# Patient Record
Sex: Female | Born: 1960
Health system: Southern US, Community
[De-identification: ages and names within clinical notes are randomized; demographics above are authoritative.]

## PROBLEM LIST (undated history)

## (undated) DIAGNOSIS — M1990 Unspecified osteoarthritis, unspecified site: Secondary | ICD-10-CM

## (undated) DIAGNOSIS — I10 Essential (primary) hypertension: Secondary | ICD-10-CM

## (undated) DIAGNOSIS — F419 Anxiety disorder, unspecified: Secondary | ICD-10-CM

## (undated) DIAGNOSIS — F319 Bipolar disorder, unspecified: Secondary | ICD-10-CM

## (undated) DIAGNOSIS — E079 Disorder of thyroid, unspecified: Secondary | ICD-10-CM

## (undated) DIAGNOSIS — J45909 Unspecified asthma, uncomplicated: Secondary | ICD-10-CM

## (undated) DIAGNOSIS — B182 Chronic viral hepatitis C: Secondary | ICD-10-CM

## (undated) DIAGNOSIS — E039 Hypothyroidism, unspecified: Secondary | ICD-10-CM

## (undated) DIAGNOSIS — K7460 Unspecified cirrhosis of liver: Secondary | ICD-10-CM

## (undated) DIAGNOSIS — K759 Inflammatory liver disease, unspecified: Secondary | ICD-10-CM

## (undated) HISTORY — PX: ABDOMINAL HYSTERECTOMY: SHX81

## (undated) HISTORY — PX: ABDOMINAL SURGERY: SHX537

## (undated) HISTORY — PX: APPENDECTOMY: SHX54

## (undated) HISTORY — PX: CHOLECYSTECTOMY: SHX55

## (undated) MED FILL — Cisplatin Inj 100 MG/100ML (1 MG/ML): INTRAVENOUS | Qty: 140 | Status: AC

## (undated) NOTE — *Deleted (*Deleted)
[facility]  [address] [place],  [place]  [address] [phone]  Clinic Day:  03/30/2020  Referring physician: Marianne Sofia, PA-C   HISTORY OF PRESENT ILLNESS:  The patient is a 70 y.o. 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  Labs dating back to 2011 have shown her platelet count consistently around 100K.  As she has not been severely thrombocytopenic, her platelets have been followed conservatively.  She comes in today for routine follow-up.  Since her last visit, the patient has been doing fairly well.  She denies having any subcutaneous bruising/bleeding issues which concern her for a declining platelet count.     PHYSICAL EXAM:  There were no vitals taken for this visit. Wt Readings from Last 3 Encounters:  01/16/20 208 lb (94.3 kg)  10/12/19 206 lb (93.4 kg)  07/02/19 218 lb (98.9 kg)   There is no height or weight on file to calculate BMI. Performance status (ECOG): {CHL ONC [id number] Physical Exam Constitutional:      Appearance: Normal appearance. She is not ill-appearing.  HENT:     Mouth/Throat:     Mouth: Mucous membranes are moist.     Pharynx: Oropharynx is clear. No oropharyngeal exudate or posterior oropharyngeal erythema.  Cardiovascular:     Rate and Rhythm: Normal rate and regular rhythm.     Heart sounds: No murmur heard.  No friction rub. No gallop.   Pulmonary:     Effort: Pulmonary effort is normal. No respiratory distress.     Breath sounds: Normal breath sounds. No wheezing, rhonchi or rales.  Abdominal:     General: Bowel sounds are normal. There is no distension.     Palpations: Abdomen is soft. There is no mass.     Tenderness: There is no abdominal tenderness.  Musculoskeletal:        General: No swelling.     Right lower leg: No edema.     Left lower leg: No edema.  Lymphadenopathy:     Cervical: No cervical adenopathy.     Upper Body:      Right upper body: No supraclavicular or axillary adenopathy.     Left upper body: No supraclavicular or axillary adenopathy.     Lower Body: No right inguinal adenopathy. No left inguinal adenopathy.  Skin:    General: Skin is warm.     Coloration: Skin is not jaundiced.     Findings: No lesion or rash.  Neurological:     General: No focal deficit present.     Mental Status: She is alert and oriented to person, place, and time. Mental status is at baseline.     Cranial Nerves: Cranial nerves are intact.  Psychiatric:        Mood and Affect: Mood normal.        Behavior: Behavior normal.        Thought Content: Thought content normal.     LABS:      CBC Latest Ref Rng & Units 01/16/2020 10/12/2019 07/02/2019  WBC 3.4 - 10.8 x10E3/uL 6.9 5.3 6.4  Hemoglobin 11.1 - 15.9 g/dL 56.2 13.0 86.5  Hematocrit 34.0 - 46.6 % 37.5 35.1 37.6  Platelets 150 - 450 x10E3/uL 78(LL) 109(L) 87(LL)   CMP Latest Ref Rng & Units 01/16/2020 10/12/2019 07/02/2019  Glucose 65 - 99 mg/dL 82 784(O) 87  BUN 6 - 24 mg/dL 17 8 13   Creatinine 0.57 - 1.00 mg/dL 9.62 9.52 8.41  Sodium 134 - 144 mmol/L 142 139 144  Potassium 3.5 - 5.2 mmol/L 3.6 3.9 4.0  Chloride 96 - 106 mmol/L 105 104 107(H)  CO2 20 - 29 mmol/L 24 22 23   Calcium 8.7 - 10.2 mg/dL 0.2(V) 8.7 9.4  Total Protein 6.0 - 8.5 g/dL 6.4 6.6 6.7  Total Bilirubin 0.0 - 1.2 mg/dL 1.1 0.8 1.2  Alkaline Phos 48 - 121 IU/L 69 67 74  AST 0 - 40 IU/L 35 27 37  ALT 0 - 32 IU/L 24 19 26      STUDIES:  No results found.    ASSESSMENT & PLAN:   Assessment/Plan:  A 46 y.o. female with *** .The patient understands all the plans discussed today and is in agreement with them.      Myrl Lazarus Kirby Funk, MD

---

## 1999-06-12 ENCOUNTER — Emergency Department (HOSPITAL_COMMUNITY): Admission: EM | Admit: 1999-06-12 | Discharge: 1999-06-12 | Payer: Self-pay | Admitting: Emergency Medicine

## 2006-04-19 ENCOUNTER — Ambulatory Visit: Payer: Self-pay | Admitting: Gastroenterology

## 2006-06-03 ENCOUNTER — Ambulatory Visit (HOSPITAL_COMMUNITY): Admission: RE | Admit: 2006-06-03 | Discharge: 2006-06-03 | Payer: Self-pay | Admitting: Gastroenterology

## 2006-06-03 ENCOUNTER — Encounter (INDEPENDENT_AMBULATORY_CARE_PROVIDER_SITE_OTHER): Payer: Self-pay | Admitting: *Deleted

## 2006-07-06 ENCOUNTER — Emergency Department (HOSPITAL_COMMUNITY): Admission: EM | Admit: 2006-07-06 | Discharge: 2006-07-07 | Payer: Self-pay | Admitting: Emergency Medicine

## 2006-08-04 ENCOUNTER — Ambulatory Visit: Payer: Self-pay | Admitting: Gastroenterology

## 2006-11-03 ENCOUNTER — Ambulatory Visit: Payer: Self-pay | Admitting: Gastroenterology

## 2006-11-16 ENCOUNTER — Emergency Department (HOSPITAL_COMMUNITY): Admission: EM | Admit: 2006-11-16 | Discharge: 2006-11-17 | Payer: Self-pay | Admitting: Emergency Medicine

## 2006-11-22 ENCOUNTER — Ambulatory Visit: Payer: Self-pay | Admitting: Gastroenterology

## 2007-02-02 ENCOUNTER — Ambulatory Visit: Payer: Self-pay | Admitting: Gastroenterology

## 2007-02-02 ENCOUNTER — Emergency Department (HOSPITAL_COMMUNITY): Admission: EM | Admit: 2007-02-02 | Discharge: 2007-02-02 | Payer: Self-pay | Admitting: Emergency Medicine

## 2007-02-16 ENCOUNTER — Ambulatory Visit: Payer: Self-pay | Admitting: Gastroenterology

## 2007-02-28 ENCOUNTER — Ambulatory Visit: Payer: Self-pay | Admitting: Gastroenterology

## 2007-03-15 ENCOUNTER — Ambulatory Visit: Payer: Self-pay | Admitting: Gastroenterology

## 2007-03-31 ENCOUNTER — Encounter: Payer: Self-pay | Admitting: Pulmonary Disease

## 2007-04-11 ENCOUNTER — Emergency Department (HOSPITAL_COMMUNITY): Admission: EM | Admit: 2007-04-11 | Discharge: 2007-04-11 | Payer: Self-pay | Admitting: Emergency Medicine

## 2007-04-12 ENCOUNTER — Ambulatory Visit: Payer: Self-pay | Admitting: Pulmonary Disease

## 2007-04-13 ENCOUNTER — Ambulatory Visit: Payer: Self-pay | Admitting: Gastroenterology

## 2007-04-20 ENCOUNTER — Ambulatory Visit: Payer: Self-pay | Admitting: Gastroenterology

## 2007-05-16 ENCOUNTER — Ambulatory Visit (HOSPITAL_COMMUNITY): Admission: RE | Admit: 2007-05-16 | Discharge: 2007-05-16 | Payer: Self-pay | Admitting: Emergency Medicine

## 2007-05-16 ENCOUNTER — Ambulatory Visit: Payer: Self-pay | Admitting: Gastroenterology

## 2007-05-25 DIAGNOSIS — J45909 Unspecified asthma, uncomplicated: Secondary | ICD-10-CM | POA: Insufficient documentation

## 2007-05-25 DIAGNOSIS — I10 Essential (primary) hypertension: Secondary | ICD-10-CM | POA: Insufficient documentation

## 2007-06-01 ENCOUNTER — Ambulatory Visit: Payer: Self-pay | Admitting: Gastroenterology

## 2007-06-06 ENCOUNTER — Ambulatory Visit: Payer: Self-pay | Admitting: Pulmonary Disease

## 2007-06-06 DIAGNOSIS — R05 Cough: Secondary | ICD-10-CM

## 2007-06-06 DIAGNOSIS — R0602 Shortness of breath: Secondary | ICD-10-CM

## 2007-06-06 DIAGNOSIS — J383 Other diseases of vocal cords: Secondary | ICD-10-CM

## 2007-06-07 ENCOUNTER — Ambulatory Visit: Payer: Self-pay | Admitting: Pulmonary Disease

## 2007-06-08 ENCOUNTER — Telehealth (INDEPENDENT_AMBULATORY_CARE_PROVIDER_SITE_OTHER): Payer: Self-pay | Admitting: *Deleted

## 2007-06-09 ENCOUNTER — Telehealth: Payer: Self-pay | Admitting: Pulmonary Disease

## 2007-06-19 ENCOUNTER — Ambulatory Visit: Payer: Self-pay | Admitting: Pulmonary Disease

## 2007-06-20 ENCOUNTER — Ambulatory Visit: Payer: Self-pay | Admitting: Gastroenterology

## 2007-06-21 DIAGNOSIS — J984 Other disorders of lung: Secondary | ICD-10-CM

## 2007-06-29 ENCOUNTER — Ambulatory Visit: Payer: Self-pay | Admitting: Gastroenterology

## 2007-07-06 ENCOUNTER — Ambulatory Visit: Payer: Self-pay | Admitting: Gastroenterology

## 2007-07-19 ENCOUNTER — Ambulatory Visit: Payer: Self-pay | Admitting: Pulmonary Disease

## 2007-07-24 ENCOUNTER — Encounter: Payer: Self-pay | Admitting: Pulmonary Disease

## 2007-07-24 ENCOUNTER — Telehealth (INDEPENDENT_AMBULATORY_CARE_PROVIDER_SITE_OTHER): Payer: Self-pay | Admitting: *Deleted

## 2007-08-01 ENCOUNTER — Encounter: Payer: Self-pay | Admitting: Pulmonary Disease

## 2007-08-10 ENCOUNTER — Ambulatory Visit: Payer: Self-pay | Admitting: Gastroenterology

## 2007-08-24 ENCOUNTER — Ambulatory Visit: Payer: Self-pay | Admitting: Gastroenterology

## 2007-09-04 ENCOUNTER — Ambulatory Visit (HOSPITAL_COMMUNITY): Admission: RE | Admit: 2007-09-04 | Discharge: 2007-09-04 | Payer: Self-pay | Admitting: Gastroenterology

## 2007-09-07 ENCOUNTER — Ambulatory Visit: Payer: Self-pay | Admitting: Gastroenterology

## 2007-09-07 ENCOUNTER — Encounter: Admission: RE | Admit: 2007-09-07 | Discharge: 2007-09-07 | Payer: Self-pay | Admitting: Obstetrics and Gynecology

## 2007-10-04 ENCOUNTER — Ambulatory Visit: Payer: Self-pay | Admitting: Pulmonary Disease

## 2007-10-05 ENCOUNTER — Ambulatory Visit: Payer: Self-pay | Admitting: Gastroenterology

## 2007-10-19 ENCOUNTER — Ambulatory Visit: Payer: Self-pay | Admitting: Gastroenterology

## 2008-02-02 ENCOUNTER — Ambulatory Visit: Payer: Self-pay | Admitting: Pulmonary Disease

## 2008-04-21 ENCOUNTER — Emergency Department (HOSPITAL_COMMUNITY): Admission: EM | Admit: 2008-04-21 | Discharge: 2008-04-21 | Payer: Self-pay | Admitting: Emergency Medicine

## 2008-07-12 ENCOUNTER — Encounter: Payer: Self-pay | Admitting: Pulmonary Disease

## 2008-07-22 ENCOUNTER — Ambulatory Visit (HOSPITAL_COMMUNITY): Admission: RE | Admit: 2008-07-22 | Discharge: 2008-07-22 | Payer: Self-pay | Admitting: Internal Medicine

## 2008-07-31 ENCOUNTER — Ambulatory Visit: Payer: Self-pay | Admitting: Pulmonary Disease

## 2008-07-31 DIAGNOSIS — J309 Allergic rhinitis, unspecified: Secondary | ICD-10-CM | POA: Insufficient documentation

## 2009-01-11 IMAGING — CR DG ABDOMEN 2V
2 series · 2 of 2 positions shown · non-contrast
Comparison: None
Correlation:  CT abdomen and pelvis 11/17/2006

CLINICAL DATA: Lower abdominal pain, redness, swelling, shortness
of breath

ABDOMEN - 2 VIEW

[w abdomen upright]
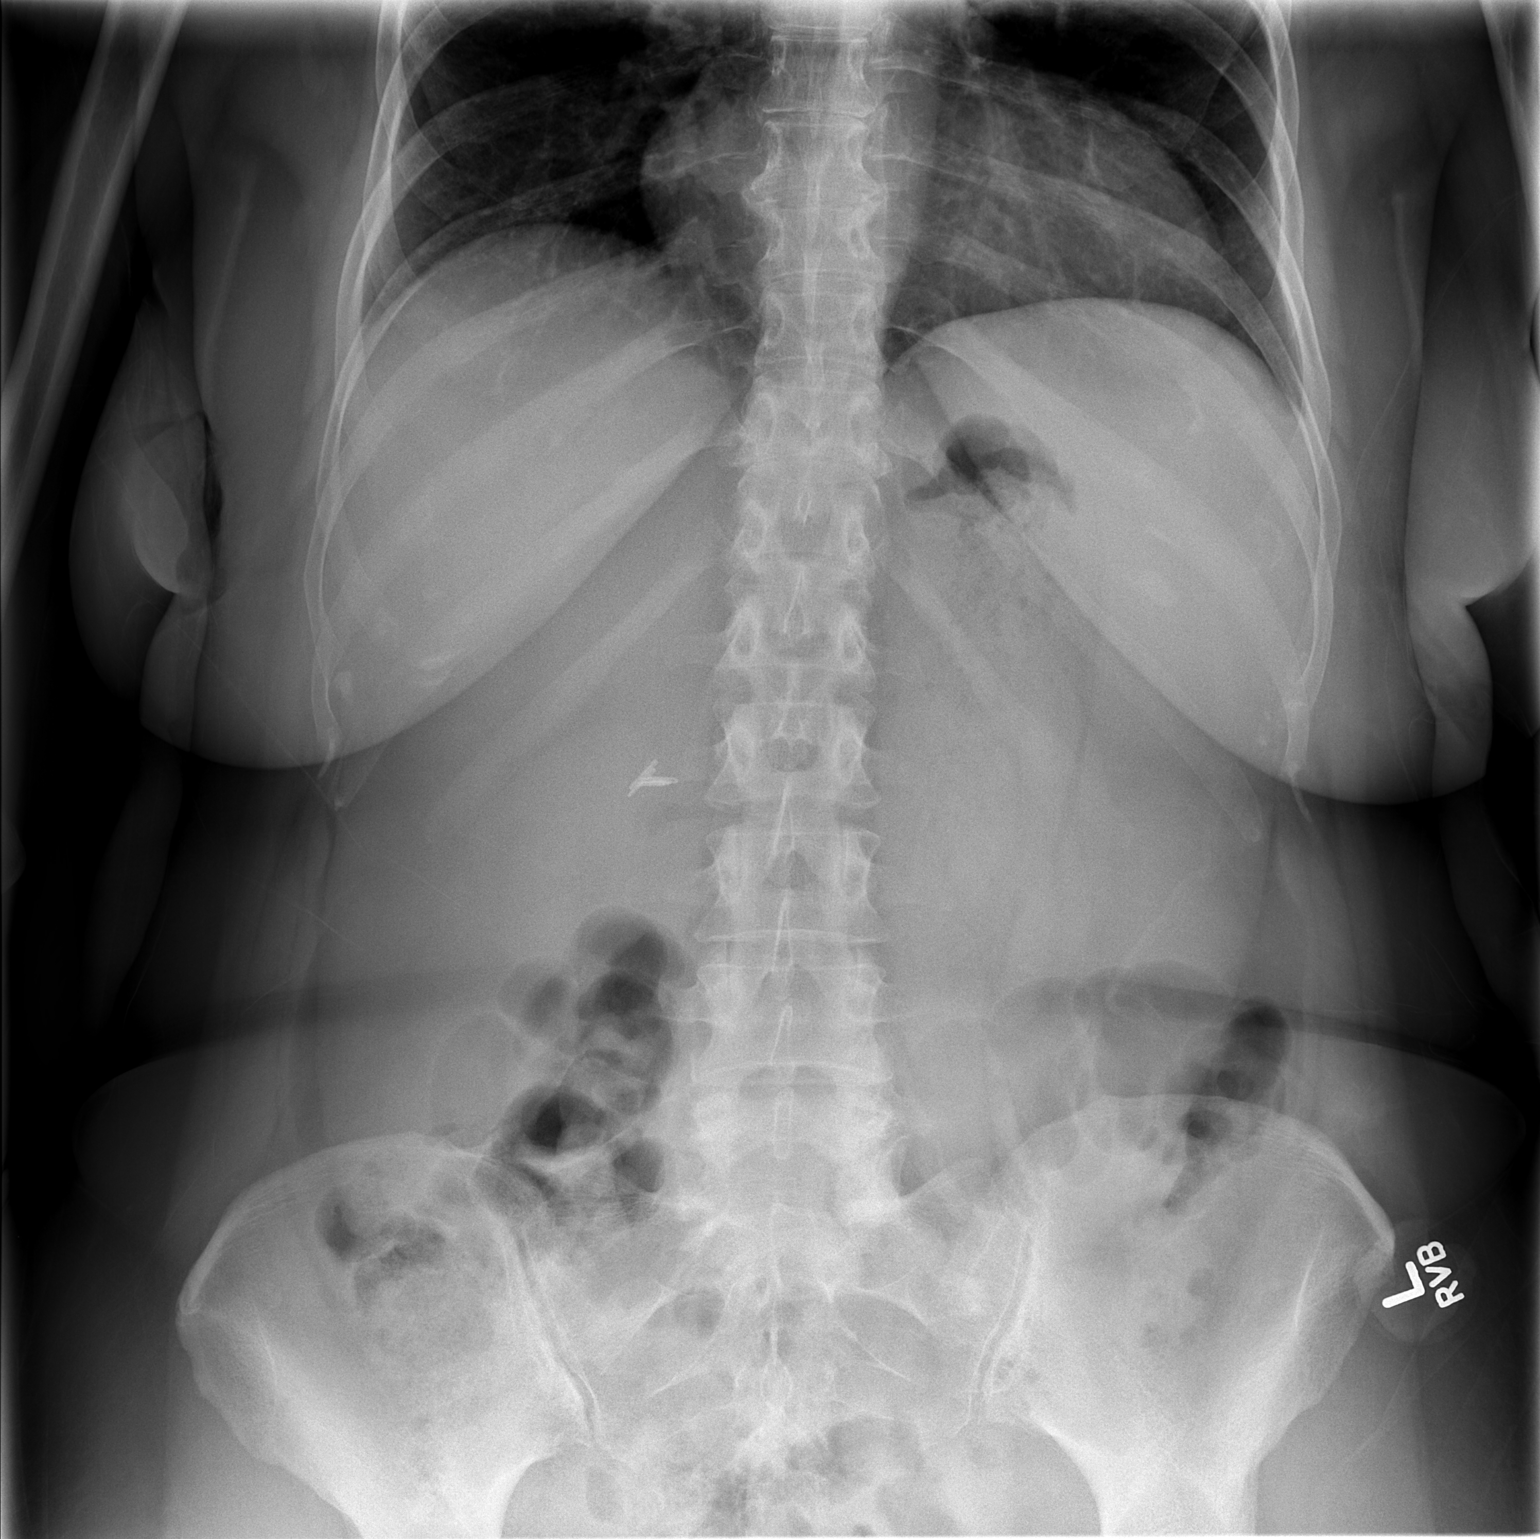

[t abdomen supine]
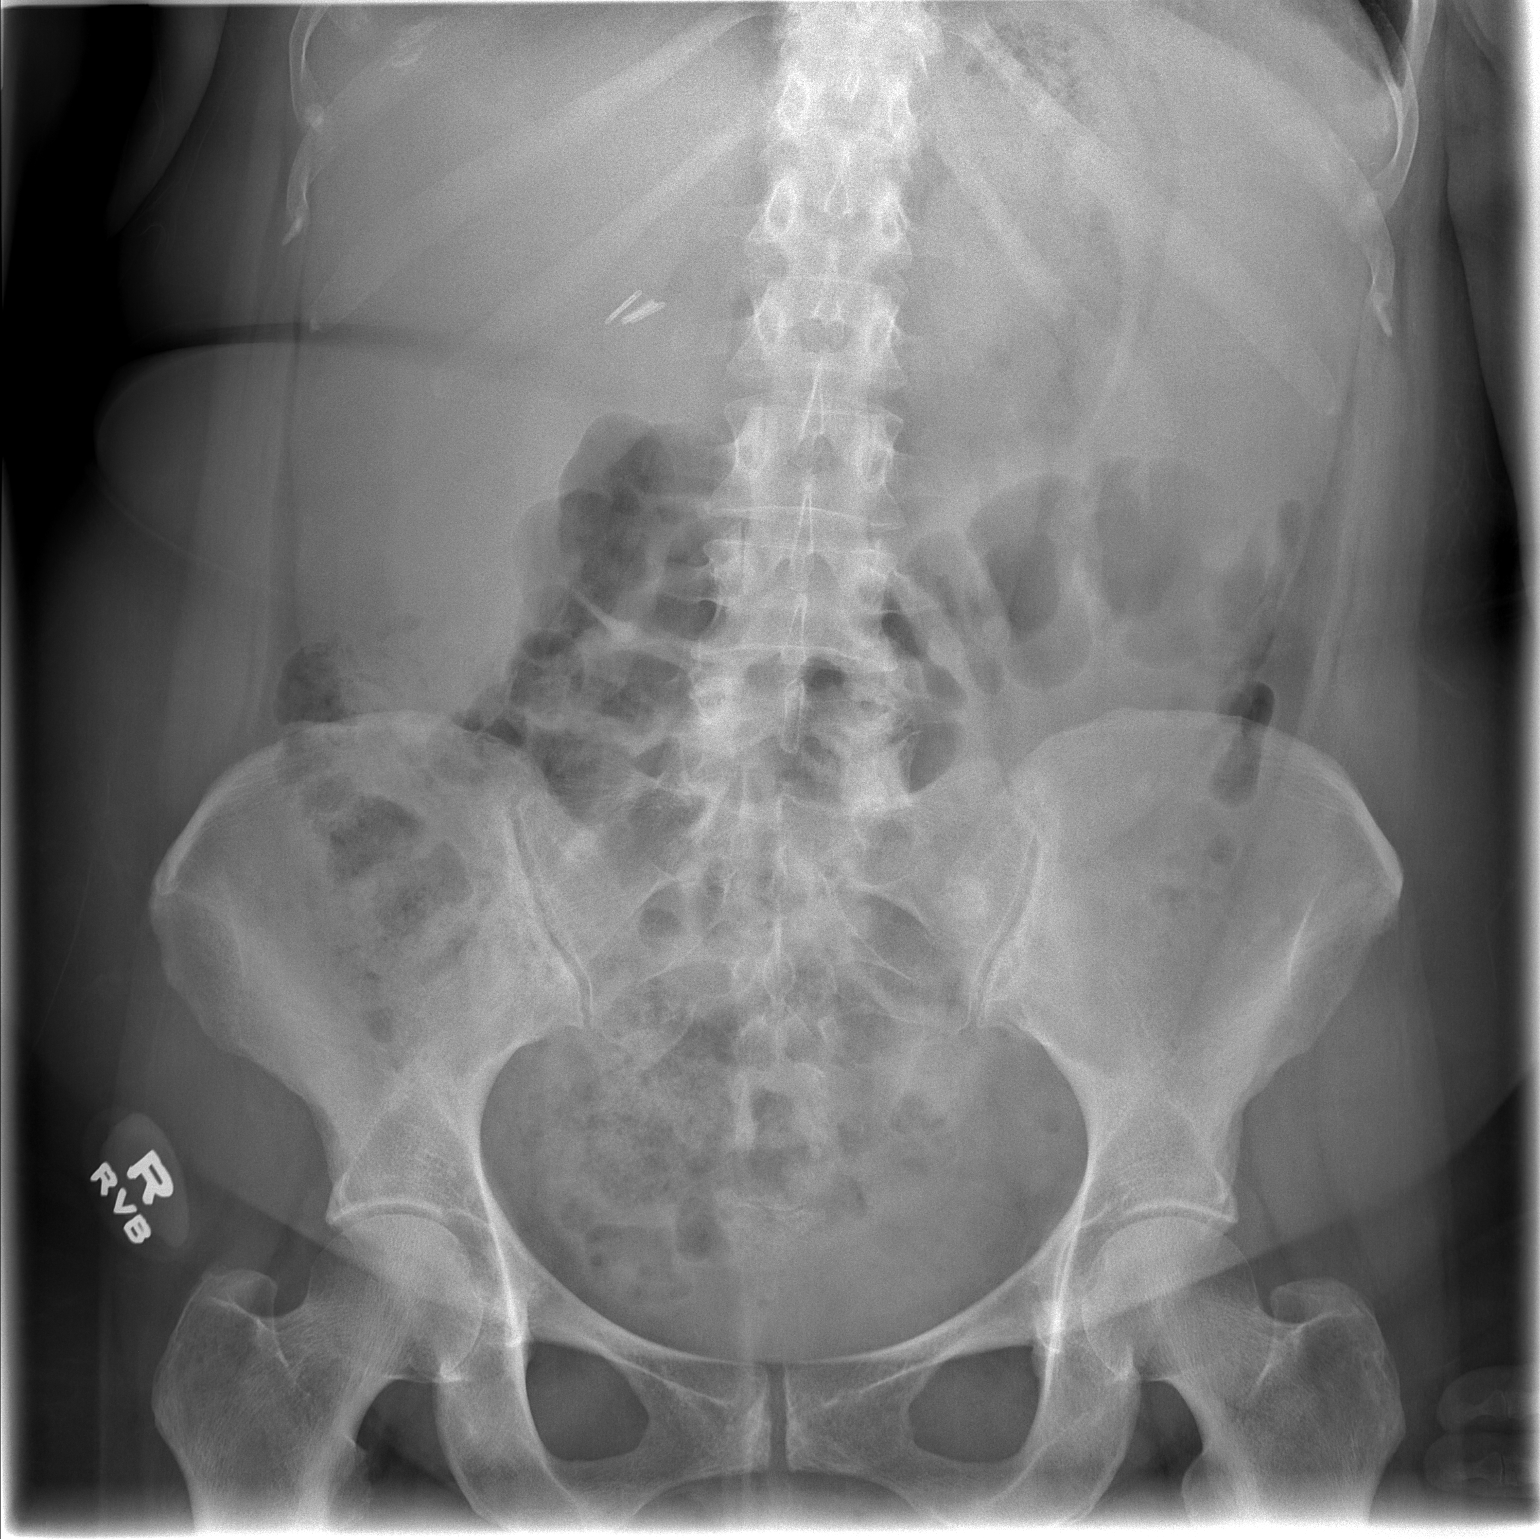

[2 of 2 positions shown; findings below may reference images not displayed]

FINDINGS: Surgical clips right upper quadrant question cholecystectomy.
Normal bowel gas pattern.
No bowel dilatation, bowel wall thickening, or free air.
Density at left pelvis corresponds to bone island in left sacrum on
prior CT.
No definite urinary tract calcification or acute bony abnormality.
IMPRESSION: No acute abnormalities.

## 2011-02-15 LAB — CBC
HCT: 25 — ABNORMAL LOW
Hemoglobin: 8.6 — ABNORMAL LOW
RDW: 17.6 — ABNORMAL HIGH
WBC: 3.6 — ABNORMAL LOW

## 2011-02-15 LAB — COMPREHENSIVE METABOLIC PANEL
AST: 74 — ABNORMAL HIGH
BUN: 5 — ABNORMAL LOW
CO2: 25
Calcium: 8.6
Chloride: 99
GFR calc non Af Amer: >60
Glucose, Bld: 125 — ABNORMAL HIGH
Potassium: 3.6
Total Bilirubin: 1.1
Total Protein: 6.9

## 2011-02-15 LAB — DIFFERENTIAL
Eosinophils Absolute: 0.1 — ABNORMAL LOW
Lymphs Abs: 0.8
Monocytes Absolute: 0.3
Monocytes Relative: 8
Neutro Abs: 2.4

## 2011-02-15 LAB — POCT CARDIAC MARKERS
Operator id: 4533
Troponin i, poc: <0.05

## 2011-02-23 LAB — CBC
HCT: 38.9
Hemoglobin: 13.2
RBC: 4.09
RDW: 13

## 2011-02-23 LAB — COMPREHENSIVE METABOLIC PANEL
Alkaline Phosphatase: 73
BUN: 15
CO2: 29
Chloride: 103
GFR calc non Af Amer: >60
Glucose, Bld: 103 — ABNORMAL HIGH
Potassium: 3.7
Total Bilirubin: 0.7
Total Protein: 6.9

## 2011-02-23 LAB — PROTIME-INR
INR: 1.1
Prothrombin Time: 14.2

## 2011-02-23 LAB — URINALYSIS, ROUTINE W REFLEX MICROSCOPIC
Hgb urine dipstick: NEGATIVE
Ketones, ur: NEGATIVE
Nitrite: NEGATIVE
Specific Gravity, Urine: 1.02
Urobilinogen, UA: 1

## 2011-02-23 LAB — LIPASE, BLOOD: Lipase: 43

## 2011-02-23 LAB — DIFFERENTIAL
Basophils Absolute: 0
Basophils Relative: 1
Eosinophils Absolute: 0.2
Neutro Abs: 3.7
Neutrophils Relative %: 54

## 2012-05-23 ENCOUNTER — Emergency Department (HOSPITAL_COMMUNITY): Payer: Medicare Other

## 2012-05-23 ENCOUNTER — Encounter (HOSPITAL_COMMUNITY): Payer: Self-pay | Admitting: Emergency Medicine

## 2012-05-23 ENCOUNTER — Emergency Department (HOSPITAL_COMMUNITY)
Admission: EM | Admit: 2012-05-23 | Discharge: 2012-05-24 | Disposition: A | Discharge status: Home / Self Care | Payer: Medicare Other | Attending: Emergency Medicine | Admitting: Emergency Medicine

## 2012-05-23 DIAGNOSIS — R0602 Shortness of breath: Secondary | ICD-10-CM | POA: Insufficient documentation

## 2012-05-23 DIAGNOSIS — B9789 Other viral agents as the cause of diseases classified elsewhere: Secondary | ICD-10-CM | POA: Insufficient documentation

## 2012-05-23 DIAGNOSIS — Z79899 Other long term (current) drug therapy: Secondary | ICD-10-CM | POA: Insufficient documentation

## 2012-05-23 DIAGNOSIS — L408 Other psoriasis: Secondary | ICD-10-CM | POA: Insufficient documentation

## 2012-05-23 DIAGNOSIS — R509 Fever, unspecified: Secondary | ICD-10-CM | POA: Insufficient documentation

## 2012-05-23 DIAGNOSIS — J069 Acute upper respiratory infection, unspecified: Secondary | ICD-10-CM

## 2012-05-23 DIAGNOSIS — Z8719 Personal history of other diseases of the digestive system: Secondary | ICD-10-CM | POA: Insufficient documentation

## 2012-05-23 DIAGNOSIS — M1710 Unilateral primary osteoarthritis, unspecified knee: Secondary | ICD-10-CM | POA: Insufficient documentation

## 2012-05-23 DIAGNOSIS — J45909 Unspecified asthma, uncomplicated: Secondary | ICD-10-CM | POA: Insufficient documentation

## 2012-05-23 DIAGNOSIS — E079 Disorder of thyroid, unspecified: Secondary | ICD-10-CM | POA: Insufficient documentation

## 2012-05-23 DIAGNOSIS — L259 Unspecified contact dermatitis, unspecified cause: Secondary | ICD-10-CM | POA: Insufficient documentation

## 2012-05-23 DIAGNOSIS — J9801 Acute bronchospasm: Secondary | ICD-10-CM | POA: Insufficient documentation

## 2012-05-23 DIAGNOSIS — IMO0002 Reserved for concepts with insufficient information to code with codable children: Secondary | ICD-10-CM | POA: Insufficient documentation

## 2012-05-23 DIAGNOSIS — B349 Viral infection, unspecified: Secondary | ICD-10-CM

## 2012-05-23 DIAGNOSIS — F319 Bipolar disorder, unspecified: Secondary | ICD-10-CM | POA: Insufficient documentation

## 2012-05-23 HISTORY — DX: Disorder of thyroid, unspecified: E07.9

## 2012-05-23 HISTORY — DX: Bipolar disorder, unspecified: F31.9

## 2012-05-23 HISTORY — DX: Chronic viral hepatitis C: B18.2

## 2012-05-23 HISTORY — DX: Unspecified osteoarthritis, unspecified site: M19.90

## 2012-05-23 HISTORY — DX: Chronic viral hepatitis C: K74.60

## 2012-05-23 HISTORY — DX: Unspecified asthma, uncomplicated: J45.909

## 2012-05-23 LAB — COMPREHENSIVE METABOLIC PANEL
AST: 110 U/L — ABNORMAL HIGH (ref 0–37)
Albumin: 3.7 g/dL (ref 3.5–5.2)
Alkaline Phosphatase: 78 U/L (ref 39–117)
BUN: 11 mg/dL (ref 6–23)
Chloride: 101 mEq/L (ref 96–112)
Creatinine, Ser: 0.61 mg/dL (ref 0.50–1.10)
Potassium: 3.5 mEq/L (ref 3.5–5.1)
Total Bilirubin: 1.1 mg/dL (ref 0.3–1.2)
Total Protein: 8.1 g/dL (ref 6.0–8.3)

## 2012-05-23 LAB — CBC WITH DIFFERENTIAL/PLATELET
Basophils Absolute: 0 10*3/uL (ref 0.0–0.1)
Basophils Relative: 0 % (ref 0–1)
Eosinophils Absolute: 0.2 10*3/uL (ref 0.0–0.7)
MCH: 33.3 pg (ref 26.0–34.0)
MCHC: 36.2 g/dL — ABNORMAL HIGH (ref 30.0–36.0)
Neutro Abs: 4.8 10*3/uL (ref 1.7–7.7)
Neutrophils Relative %: 64 % (ref 43–77)
RDW: 12.8 % (ref 11.5–15.5)

## 2012-05-23 MED ORDER — BENZONATATE 100 MG PO CAPS
200.0000 mg | ORAL_CAPSULE | Freq: Three times a day (TID) | ORAL | Status: DC | PRN
  Administered 2012-05-24: 200 mg via ORAL
  Filled 2012-05-23: qty 2

## 2012-05-23 MED ORDER — PREDNISONE 20 MG PO TABS
60.0000 mg | ORAL_TABLET | Freq: Once | ORAL | Status: AC
  Administered 2012-05-24: 60 mg via ORAL
  Filled 2012-05-23: qty 3

## 2012-05-23 MED ORDER — ALBUTEROL SULFATE (5 MG/ML) 0.5% IN NEBU
2.5000 mg | INHALATION_SOLUTION | Freq: Once | RESPIRATORY_TRACT | Status: AC
  Administered 2012-05-24: 2.5 mg via RESPIRATORY_TRACT
  Filled 2012-05-23: qty 1

## 2012-05-23 MED ORDER — HYDROCOD POLST-CHLORPHEN POLST 10-8 MG/5ML PO LQCR
5.0000 mL | Freq: Once | ORAL | Status: AC
  Administered 2012-05-24: 5 mL via ORAL
  Filled 2012-05-23: qty 5

## 2012-05-23 MED ORDER — IPRATROPIUM BROMIDE 0.02 % IN SOLN
0.5000 mg | Freq: Once | RESPIRATORY_TRACT | Status: AC
  Administered 2012-05-24: 0.5 mg via RESPIRATORY_TRACT
  Filled 2012-05-23: qty 2.5

## 2012-05-23 NOTE — ED Notes (Signed)
Pt alert, arrives from home, c/o cough and sob, onset was last week, unable to see PCP, resp even unlabored, skin pwd

## 2012-05-24 MED ORDER — GUAIFENESIN ER 600 MG PO TB12: 600.0000 mg | ORAL_TABLET | Freq: Two times a day (BID) | ORAL | Status: DC | PRN

## 2012-05-24 MED ORDER — ALBUTEROL SULFATE (5 MG/ML) 0.5% IN NEBU
5.0000 mg | INHALATION_SOLUTION | Freq: Once | RESPIRATORY_TRACT | Status: AC
  Administered 2012-05-24: 5 mg via RESPIRATORY_TRACT
  Filled 2012-05-24: qty 1

## 2012-05-24 MED ORDER — IPRATROPIUM BROMIDE 0.02 % IN SOLN
0.5000 mg | Freq: Once | RESPIRATORY_TRACT | Status: AC
  Administered 2012-05-24: 0.5 mg via RESPIRATORY_TRACT
  Filled 2012-05-24: qty 2.5

## 2012-05-24 MED ORDER — OXYMETAZOLINE HCL 0.05 % NA SOLN
1.0000 | Freq: Once | NASAL | Status: AC
  Administered 2012-05-24: 1 via NASAL
  Filled 2012-05-24: qty 15

## 2012-05-24 MED ORDER — HYDROCOD POLST-CHLORPHEN POLST 10-8 MG/5ML PO LQCR: 5.0000 mL | Freq: Every evening | ORAL | Status: DC | PRN

## 2012-05-24 MED ORDER — BENZONATATE 200 MG PO CAPS: 200.0000 mg | ORAL_CAPSULE | Freq: Three times a day (TID) | ORAL | Status: DC | PRN

## 2012-05-24 MED ORDER — PREDNISONE 20 MG PO TABS: 60.0000 mg | ORAL_TABLET | Freq: Every day | ORAL | Status: DC

## 2012-05-24 NOTE — ED Provider Notes (Signed)
History     CSN: [id number]  Arrival date & time 05/23/12  2014   First MD Initiated Contact with Patient 05/23/12 2305      Chief Complaint  Patient presents with  . Cough  . Shortness of Breath    (Consider location/radiation/quality/duration/timing/severity/associated sxs/prior treatment) HPI 52 year old female presents emergency department with multiple complaints. She reports she has had cough and shortness of breath since Friday, 5 days ago. She has had subjective fevers. She's been using her albuterol inhaler and nebulizer without improvement in symptoms. She reports she was seen by her primary care Dr. 2 weeks ago, and they increased her Synthroid and told her her potassium was low. Patient has history of cirrhosis of the liver. She is complaining of right-sided abdominal pain do to increased coughing. She has had no nausea no vomiting no diarrhea. She received a flu shot and a Pneumovax this year. Patient has not been taking any over-the-counter cold medications. She reports she has the sensation of thick phlegm in the back of her throat which sometimes chokes her. She attempted to get in to see her doctor but they were too busy to see her today. She also is complaining of worsening of her ongoing eczema and psoriasis. She also is complaining of swelling in her knees where she has chronic arthritis and needs knee replacement.  Past Medical History  Diagnosis Date  . Cirrhosis of liver due to hepatitis C   . Asthma   . Arthritis   . Thyroid disease   . Bipolar affective disorder     Past Surgical History  Procedure Date  . Abdominal hysterectomy   . Cholecystectomy   . Appendectomy   . Abdominal surgery     No family history on file.  History  Substance Use Topics  . Smoking status: Never Smoker   . Smokeless tobacco: Not on file  . Alcohol Use: No    OB History    Grav Para Term Preterm Abortions TAB SAB Ect Mult Living                  Review of Systems  See History of Present Illness; otherwise all other systems are reviewed and negative  Allergies  Penicillins  Home Medications   Current Outpatient Rx  Name  Route  Sig  Dispense  Refill  . ALBUTEROL SULFATE HFA 108 (90 BASE) MCG/ACT IN AERS   Inhalation   Inhale 2 puffs into the lungs every 6 (six) hours as needed. ASTHMA         . ALPRAZOLAM 1 MG PO TABS   Oral   Take 1 mg by mouth 3 (three) times daily as needed. Anxiety         . BUDESONIDE-FORMOTEROL FUMARATE 160-4.5 MCG/ACT IN AERO   Inhalation   Inhale 2 puffs into the lungs 2 (two) times daily.         Marland Kitchen HYDROCODONE-ACETAMINOPHEN 5-500 MG PO TABS   Oral   Take 1 tablet by mouth every 6 (six) hours as needed. Pain         . LEVOTHYROXINE SODIUM 50 MCG PO TABS   Oral   Take 50 mcg by mouth daily.         Marland Kitchen LORATADINE 10 MG PO TABS   Oral   Take 10 mg by mouth daily.         Marland Kitchen LOSARTAN POTASSIUM-HCTZ 100-25 MG PO TABS   Oral   Take 1 tablet by mouth daily.         Marland Kitchen  POTASSIUM CHLORIDE ER 10 MEQ PO CPCR   Oral   Take 10 mEq by mouth daily.         Marland Kitchen ZOLPIDEM TARTRATE 5 MG PO TABS   Oral   Take 5 mg by mouth at bedtime.         Marland Kitchen LEVOTHYROXINE SODIUM 75 MCG PO TABS   Oral   Take 75 mcg by mouth daily.           BP 154/104  Pulse 91  Temp 98 F (36.7 C) (Oral)  Resp 16  SpO2 96%  Physical Exam  Nursing note and vitals reviewed. Constitutional:       Morbidly obese female, uncomfortable appearing but nontoxic  HENT:  Head: Normocephalic and atraumatic.  Nose: Nose normal.  Mouth/Throat: Oropharynx is clear and moist. No oropharyngeal exudate.  Eyes: Conjunctivae normal and EOM are normal. Pupils are equal, round, and reactive to light.  Neck: Normal range of motion. Neck supple.  Pulmonary/Chest: She has wheezes.  Abdominal: Soft. Bowel sounds are normal. She exhibits no distension and no mass. There is tenderness (mild tenderness to right side of abdomen). There is no rebound  and no guarding.  Musculoskeletal: Normal range of motion. She exhibits tenderness (tenderness to palpation to bilateralher knees). She exhibits no edema.  Neurological: She exhibits normal muscle tone. Coordination normal.  Skin: Skin is warm and dry. Rash (chronic rash to left lower leg, sacrum) noted. No erythema. No pallor.    ED Course  Procedures (including critical care time)  Labs Reviewed  CBC WITH DIFFERENTIAL - Abnormal; Notable for the following:    MCHC 36.2 (*)     Platelets 117 (*)     All other components within normal limits  COMPREHENSIVE METABOLIC PANEL - Abnormal; Notable for the following:    AST 110 (*)     ALT 103 (*)     All other components within normal limits   Dg Chest 2 View  05/23/2012  *RADIOLOGY REPORT*  Clinical Data: Shortness of breath and cough.  CHEST - 2 VIEW  Comparison: 08/04/2011  Findings: The heart size and pulmonary vascularity are normal. The lungs appear clear and expanded without focal air space disease or consolidation. No blunting of the costophrenic angles.  No pneumothorax.  Mediastinal contours appear intact.  Surgical clips in the right upper quadrant.  No significant changes since the previous study.  IMPRESSION: No evidence of active pulmonary disease.   Original Report Authenticated By: Burman Nieves, M.D.      1. Upper respiratory infection   2. Acute bronchospasm due to viral infection       MDM  52 year old female with history of asthma now with wheezing. Suspect bronchospasm secondary to viral infection. Chest x-ray unremarkable. She is noted to have elevation in her AST ALT and low platelets which go along with her cirrhosis of the liver. Will treat with steroids, albuterol/Atrovent neb treatment, and medications to help with cold symptoms   2:16 AM  Patient has received 2 nebulized treatments. There is some vocal cord dysfunction with some expiratory wheezing in the upper airway, she has faint expiratory wheezes in  her lung fields. She is satting normally, though she is safe to go home to followup with her primary care Dr.      Olivia Mackie, MD 05/24/12 [phone]

## 2012-06-24 ENCOUNTER — Other Ambulatory Visit: Payer: Self-pay

## 2012-10-16 DIAGNOSIS — K7460 Unspecified cirrhosis of liver: Secondary | ICD-10-CM | POA: Insufficient documentation

## 2013-03-15 ENCOUNTER — Other Ambulatory Visit: Payer: Self-pay

## 2014-02-22 ENCOUNTER — Other Ambulatory Visit: Payer: Self-pay

## 2015-06-27 DIAGNOSIS — Z1231 Encounter for screening mammogram for malignant neoplasm of breast: Secondary | ICD-10-CM | POA: Diagnosis not present

## 2015-07-24 DIAGNOSIS — I10 Essential (primary) hypertension: Secondary | ICD-10-CM | POA: Diagnosis not present

## 2015-07-24 DIAGNOSIS — F411 Generalized anxiety disorder: Secondary | ICD-10-CM | POA: Diagnosis not present

## 2015-07-24 DIAGNOSIS — M170 Bilateral primary osteoarthritis of knee: Secondary | ICD-10-CM | POA: Diagnosis not present

## 2015-07-24 DIAGNOSIS — B182 Chronic viral hepatitis C: Secondary | ICD-10-CM | POA: Diagnosis not present

## 2015-07-24 DIAGNOSIS — E038 Other specified hypothyroidism: Secondary | ICD-10-CM | POA: Diagnosis not present

## 2015-08-01 DIAGNOSIS — R161 Splenomegaly, not elsewhere classified: Secondary | ICD-10-CM | POA: Diagnosis not present

## 2015-08-01 DIAGNOSIS — R160 Hepatomegaly, not elsewhere classified: Secondary | ICD-10-CM | POA: Diagnosis not present

## 2015-08-01 DIAGNOSIS — C220 Liver cell carcinoma: Secondary | ICD-10-CM | POA: Diagnosis not present

## 2015-08-01 DIAGNOSIS — K769 Liver disease, unspecified: Secondary | ICD-10-CM | POA: Diagnosis not present

## 2015-10-29 DIAGNOSIS — Z1211 Encounter for screening for malignant neoplasm of colon: Secondary | ICD-10-CM | POA: Diagnosis not present

## 2015-10-29 DIAGNOSIS — Z0000 Encounter for general adult medical examination without abnormal findings: Secondary | ICD-10-CM | POA: Diagnosis not present

## 2015-10-29 DIAGNOSIS — R5383 Other fatigue: Secondary | ICD-10-CM | POA: Diagnosis not present

## 2015-10-29 DIAGNOSIS — E038 Other specified hypothyroidism: Secondary | ICD-10-CM | POA: Diagnosis not present

## 2015-10-29 DIAGNOSIS — E782 Mixed hyperlipidemia: Secondary | ICD-10-CM | POA: Diagnosis not present

## 2015-10-29 DIAGNOSIS — Z6838 Body mass index (BMI) 38.0-38.9, adult: Secondary | ICD-10-CM | POA: Diagnosis not present

## 2015-11-03 DIAGNOSIS — K7469 Other cirrhosis of liver: Secondary | ICD-10-CM | POA: Diagnosis not present

## 2016-02-02 DIAGNOSIS — J060 Acute laryngopharyngitis: Secondary | ICD-10-CM | POA: Diagnosis not present

## 2016-02-02 DIAGNOSIS — E038 Other specified hypothyroidism: Secondary | ICD-10-CM | POA: Diagnosis not present

## 2016-02-02 DIAGNOSIS — M170 Bilateral primary osteoarthritis of knee: Secondary | ICD-10-CM | POA: Diagnosis not present

## 2016-02-02 DIAGNOSIS — B182 Chronic viral hepatitis C: Secondary | ICD-10-CM | POA: Diagnosis not present

## 2016-02-02 DIAGNOSIS — I10 Essential (primary) hypertension: Secondary | ICD-10-CM | POA: Diagnosis not present

## 2016-02-02 DIAGNOSIS — E782 Mixed hyperlipidemia: Secondary | ICD-10-CM | POA: Diagnosis not present

## 2016-02-02 DIAGNOSIS — F411 Generalized anxiety disorder: Secondary | ICD-10-CM | POA: Diagnosis not present

## 2016-02-02 DIAGNOSIS — J069 Acute upper respiratory infection, unspecified: Secondary | ICD-10-CM | POA: Diagnosis not present

## 2016-02-06 DIAGNOSIS — C220 Liver cell carcinoma: Secondary | ICD-10-CM | POA: Diagnosis not present

## 2016-02-06 DIAGNOSIS — Z9049 Acquired absence of other specified parts of digestive tract: Secondary | ICD-10-CM | POA: Diagnosis not present

## 2016-02-06 DIAGNOSIS — K766 Portal hypertension: Secondary | ICD-10-CM | POA: Diagnosis not present

## 2016-02-06 DIAGNOSIS — R160 Hepatomegaly, not elsewhere classified: Secondary | ICD-10-CM | POA: Diagnosis not present

## 2016-02-16 DIAGNOSIS — E038 Other specified hypothyroidism: Secondary | ICD-10-CM | POA: Diagnosis not present

## 2016-02-16 DIAGNOSIS — Z23 Encounter for immunization: Secondary | ICD-10-CM | POA: Diagnosis not present

## 2016-06-09 DIAGNOSIS — J060 Acute laryngopharyngitis: Secondary | ICD-10-CM | POA: Diagnosis not present

## 2016-06-09 DIAGNOSIS — R5383 Other fatigue: Secondary | ICD-10-CM | POA: Diagnosis not present

## 2016-06-09 DIAGNOSIS — J069 Acute upper respiratory infection, unspecified: Secondary | ICD-10-CM | POA: Diagnosis not present

## 2016-06-09 DIAGNOSIS — I10 Essential (primary) hypertension: Secondary | ICD-10-CM | POA: Diagnosis not present

## 2016-06-09 DIAGNOSIS — E038 Other specified hypothyroidism: Secondary | ICD-10-CM | POA: Diagnosis not present

## 2016-06-09 DIAGNOSIS — F411 Generalized anxiety disorder: Secondary | ICD-10-CM | POA: Diagnosis not present

## 2016-06-09 DIAGNOSIS — B182 Chronic viral hepatitis C: Secondary | ICD-10-CM | POA: Diagnosis not present

## 2016-06-09 DIAGNOSIS — M170 Bilateral primary osteoarthritis of knee: Secondary | ICD-10-CM | POA: Diagnosis not present

## 2016-06-11 ENCOUNTER — Telehealth: Payer: Self-pay | Admitting: Hematology & Oncology

## 2016-06-11 NOTE — Telephone Encounter (Signed)
Patient called wanting to schedule an appt as a new patient with [facility]. I advised patient that we would need a referral from her PCP first. Patient stated that she will have her PCP fax over a referral.     [facility] FEBRUARY 2018  [facility]

## 2016-07-05 ENCOUNTER — Ambulatory Visit (HOSPITAL_BASED_OUTPATIENT_CLINIC_OR_DEPARTMENT_OTHER): Payer: PPO | Admitting: Family

## 2016-07-05 ENCOUNTER — Ambulatory Visit: Payer: PPO

## 2016-07-05 ENCOUNTER — Other Ambulatory Visit: Payer: Self-pay | Admitting: Family

## 2016-07-05 ENCOUNTER — Other Ambulatory Visit (HOSPITAL_BASED_OUTPATIENT_CLINIC_OR_DEPARTMENT_OTHER): Payer: PPO

## 2016-07-05 DIAGNOSIS — D696 Thrombocytopenia, unspecified: Secondary | ICD-10-CM

## 2016-07-05 DIAGNOSIS — Z8619 Personal history of other infectious and parasitic diseases: Secondary | ICD-10-CM | POA: Diagnosis not present

## 2016-07-05 DIAGNOSIS — K7460 Unspecified cirrhosis of liver: Secondary | ICD-10-CM | POA: Diagnosis not present

## 2016-07-05 LAB — COMPREHENSIVE METABOLIC PANEL (CC13)
A/G RATIO: 1.2 (ref 1.2–2.2)
ALBUMIN: 4.1 g/dL (ref 3.5–5.5)
ALT: 23 IU/L (ref 0–32)
AST (SGOT): 33 IU/L (ref 0–40)
Alkaline Phosphatase, S: 73 IU/L (ref 39–117)
BILIRUBIN TOTAL: 0.9 mg/dL (ref 0.0–1.2)
BUN/Creatinine Ratio: 17 (ref 9–23)
BUN: 12 mg/dL (ref 6–24)
CALCIUM: 9.4 mg/dL (ref 8.7–10.2)
CHLORIDE: 104 mmol/L (ref 96–106)
Carbon Dioxide, Total: 25 mmol/L (ref 18–29)
Creatinine, Ser: 0.72 mg/dL (ref 0.57–1.00)
GFR, EST AFRICAN AMERICAN: 108 mL/min/{1.73_m2} (ref >59)
GFR, EST NON AFRICAN AMERICAN: 94 mL/min/{1.73_m2} (ref >59)
Globulin, Total: 3.4 g/dL (ref 1.5–4.5)
Glucose: 95 mg/dL (ref 65–99)
POTASSIUM: 3.6 mmol/L (ref 3.5–5.2)
SODIUM: 132 mmol/L — AB (ref 134–144)
TOTAL PROTEIN: 7.5 g/dL (ref 6.0–8.5)

## 2016-07-05 LAB — CBC WITH DIFFERENTIAL (CANCER CENTER ONLY)
BASO#: 0 10*3/uL (ref 0.0–0.2)
BASO%: 0.4 % (ref 0.0–2.0)
EOS%: 1.8 % (ref 0.0–7.0)
Eosinophils Absolute: 0.2 10*3/uL (ref 0.0–0.5)
HEMATOCRIT: 39.8 % (ref 34.8–46.6)
HEMOGLOBIN: 14 g/dL (ref 11.6–15.9)
LYMPH#: 1.2 10*3/uL (ref 0.9–3.3)
LYMPH%: 15 % (ref 14.0–48.0)
MCH: 32 pg (ref 26.0–34.0)
MCHC: 35.2 g/dL (ref 32.0–36.0)
MCV: 91 fL (ref 81–101)
MONO#: 0.7 10*3/uL (ref 0.1–0.9)
MONO%: 8 % (ref 0.0–13.0)
NEUT%: 74.8 % (ref 39.6–80.0)
NEUTROS ABS: 6.2 10*3/uL (ref 1.5–6.5)
Platelets: 116 10*3/uL — ABNORMAL LOW (ref 145–400)
RBC: 4.37 10*6/uL (ref 3.70–5.32)
RDW: 13.5 % (ref 11.1–15.7)
WBC: 8.3 10*3/uL (ref 3.9–10.0)

## 2016-07-05 LAB — CHCC SATELLITE - SMEAR

## 2016-07-05 NOTE — Progress Notes (Signed)
Hematology/Oncology Consultation   Name: Natalie Perry      MRN: [id number]    Location: Room/bed info not found  Date: 07/05/2016 Time:12:57 PM   REFERRING PHYSICIAN: Adron Bene, PA-C  REASON FOR CONSULT: Thrombocytopenia   DIAGNOSIS:  1. Thrombocytopenia  2. Cirrhosis 3. Hepatitis C - completed treatment with Harvoni in 2016  4. Enlarged spleen    HISTORY OF PRESENT ILLNESS: Natalie Perry is a very pleasant 56 yo white female with history of thrombocytopenia. Platelet count today is 116. She has had no episodes of bleeding and no petechial rash. She has noticed that she bruises easily. No anemia at this time. WBC count is stable.  She has cirrhosis of the live and was treated for Hepatitis C with Harvoni in 2016. She also has splenomegaly and states that she has an MRI of the abdomen every 6 months with [facility] to monitor her liver and spleen. Has history of hepatic encephalopathy.  She states that her mother, sister, brother and 2 nieces have low platelet counts as well. They all have history of iron deficiency anemia including her youngest son.  She has 2 children, no history of miscarriage. She states that she bled heavily with each birth but did not require transfusion at that time.  She did however require a transfusion prior to having a partial hysterectomy. She some hemorrhaging with her monthly cycle which led to the surgery. She has one ovary left.  She has hypothyroidism and states that she recently had her synthroid adjusted and will be having follow-up blood work soon.  She states that she has issues with frequent infections and was recently on an antibiotic for an upper respiratory infections.  She needs to have a right knee replacement but states that her team with [facility] feels that her immune system is still not ready. She is currently wearing a brace. She also uses a cane when ambulating. No falls or syncopal episodes.  No fever, chills, n/v, cough, rash, dizziness,  chest pain, abdominal pain or changes in bowel or bladder habits.  She has some SOB with over exertion due to COPD. She will take time to rest as needed.  No swelling, tenderness, numbness or tingling in her extremities. No c/o pain at this time.  She has maintained a good appetite and is staying hydrated. She denies an significant weight loss or gain.  She is no longer a smoker (quiting over 10 years ago) or drink alcoholic beverages.  She is unsure how she acquired the Hep C. She states that she worked for years in a [employer] with other coworkers and frequently had small cuts on her hands and fingers which may have led to the transmission of it.   ROS: All other 10 point review of systems is negative.   PAST MEDICAL HISTORY:   Past Medical History:  Diagnosis Date  . Arthritis   . Asthma   . Bipolar affective disorder   . Cirrhosis of liver due to hepatitis C   . Thyroid disease     ALLERGIES: Allergies  Allergen Reactions  . Penicillins Hives and Swelling      MEDICATIONS:  Current Outpatient Prescriptions on File Prior to Visit  Medication Sig Dispense Refill  . albuterol (PROVENTIL HFA;VENTOLIN HFA) 108 (90 BASE) MCG/ACT inhaler Inhale 2 puffs into the lungs every 6 (six) hours as needed. ASTHMA    . ALPRAZolam (XANAX) 1 MG tablet Take 1 mg by mouth 3 (three) times daily as  needed. Anxiety    . budesonide-formoterol (SYMBICORT) 160-4.5 MCG/ACT inhaler Inhale 2 puffs into the lungs 2 (two) times daily.    . chlorpheniramine-HYDROcodone (TUSSIONEX) 10-8 MG/5ML LQCR Take 5 mLs by mouth at bedtime as needed (cough). 140 mL 0  . HYDROcodone-acetaminophen (VICODIN) 5-500 MG per tablet Take 1 tablet by mouth every 6 (six) hours as needed. Pain    . levothyroxine (SYNTHROID, LEVOTHROID) 50 MCG tablet Take 50 mcg by mouth daily.    Marland Kitchen levothyroxine (SYNTHROID, LEVOTHROID) 75 MCG tablet Take 75 mcg by mouth daily.    Marland Kitchen loratadine (CLARITIN) 10 MG tablet Take 10 mg by  mouth daily.    Marland Kitchen losartan-hydrochlorothiazide (HYZAAR) 100-25 MG per tablet Take 1 tablet by mouth daily.    . potassium chloride (MICRO-K) 10 MEQ CR capsule Take 10 mEq by mouth daily.    . predniSONE (DELTASONE) 20 MG tablet Take 3 tablets (60 mg total) by mouth daily. 15 tablet 0  . zolpidem (AMBIEN) 5 MG tablet Take 5 mg by mouth at bedtime.     No current facility-administered medications on file prior to visit.      PAST SURGICAL HISTORY Past Surgical History:  Procedure Laterality Date  . ABDOMINAL HYSTERECTOMY    . ABDOMINAL SURGERY    . APPENDECTOMY    . CHOLECYSTECTOMY      FAMILY HISTORY: No family history on file.  SOCIAL HISTORY:  reports that she has never smoked. She does not have any smokeless tobacco history on file. She reports that she does not drink alcohol. Her drug history is not on file.  PERFORMANCE STATUS: The patient's performance status is 1 - Symptomatic but completely ambulatory  PHYSICAL EXAM: Most Recent Vital Signs: There were no vitals taken for this visit. BP 135/71 (BP Location: Left Arm, Patient Position: Sitting)   Pulse 76   Temp 98.4 F (36.9 C) (Oral)   Resp 18   Ht '5\' 3"'$  (1.6 m)   Wt 224 lb (101.6 kg)   SpO2 99%   BMI 39.68 kg/m   General Appearance:    Alert, cooperative, no distress, appears stated age  Head:    Normocephalic, without obvious abnormality, atraumatic  Eyes:    PERRL, conjunctiva/corneas clear, EOM's intact, fundi    benign, both eyes        Throat:   Lips, mucosa, and tongue normal; teeth and gums normal  Neck:   Supple, symmetrical, trachea midline, no adenopathy;    thyroid:  no enlargement/tenderness/nodules; no carotid   bruit or JVD  Back:     Symmetric, no curvature, ROM normal, no CVA tenderness  Lungs:     Clear to auscultation bilaterally, respirations unlabored  Chest Wall:    No tenderness or deformity   Heart:    Regular rate and rhythm, S1 and S2 normal, no murmur, rub   or gallop      Abdomen:     Soft, non-tender, bowel sounds active all four quadrants,    no masses, no organomegaly        Extremities:   Extremities normal, atraumatic, no cyanosis or edema  Pulses:   2+ and symmetric all extremities  Skin:   Skin color, texture, turgor normal, no rashes or lesions  Lymph nodes:   Cervical, supraclavicular, and axillary nodes normal  Neurologic:   CNII-XII intact, normal strength, sensation and reflexes    throughout    LABORATORY DATA:  Results for orders placed or performed in visit on 07/05/16 (from the  past 48 hour(s))  CBC w/Diff     Status: Abnormal   Collection Time: 07/05/16 12:42 PM  Result Value Ref Range   WBC 8.3 3.9 - 10.0 10e3/uL   RBC 4.37 3.70 - 5.32 10e6/uL   HGB 14.0 11.6 - 15.9 g/dL   HCT 39.8 34.8 - 46.6 %   MCV 91 81 - 101 fL   MCH 32.0 26.0 - 34.0 pg   MCHC 35.2 32.0 - 36.0 g/dL   RDW 13.5 11.1 - 15.7 %   Platelets 116 Platelet count consistent in citrate (L) 145 - 400 10e3/uL   NEUT# 6.2 1.5 - 6.5 10e3/uL   LYMPH# 1.2 0.9 - 3.3 10e3/uL   MONO# 0.7 0.1 - 0.9 10e3/uL   Eosinophils Absolute 0.2 0.0 - 0.5 10e3/uL   BASO# 0.0 0.0 - 0.2 10e3/uL   NEUT% 74.8 39.6 - 80.0 %   LYMPH% 15.0 14.0 - 48.0 %   MONO% 8.0 0.0 - 13.0 %   EOS% 1.8 0.0 - 7.0 %   BASO% 0.4 0.0 - 2.0 %  Smear     Status: None   Collection Time: 07/05/16 12:42 PM  Result Value Ref Range   Smear Result Smear Available       RADIOGRAPHY: No results found.     PATHOLOGY: None  ASSESSMENT/PLAN: Ms. Storbeck is a very pleasant 56 yo white female with a long history of thrombocytopenia. She also has a history of cirrhosis and splenomegaly which are likely the driving force behind the thrombocytopenia. She was treated for Hep C in 2016 with Harvoni. She continues to be followed closely by Dr. Kellie Simmering with [facility] every 6 months as well as having an abdominal MRI every 6 months to follow her cirrhosis and splenomegaly.  Her platelet count is stable at 116  at this time. No anemia and WBC count is stable.   We will plan to see her back in 4 months for repeat lab work and follow-up. If everything is still stable at that time we will let her go from our office. She is in agreement with the plan.   All questions were answered. She will contact our office with any problems, questions or concerns. We can certainly see her much sooner if necessary.  She was discussed with and also seen by Dr. Marin Olp and he is in agreement with the aforementioned.   [facility] M     Addendum:  I saw and examined patient with Jedadiah Abdallah. I agree with the assessment. I have to believe that the thrombocytopenia is clearly from her having cirrhosis and now with splenomegaly.  I looked at her blood under the microscope. I do not see anything that looked suspicious. Platelets were well granulated. I do not see any nucleated red blood cells. There were no immature myeloid cells. There are no atypical lymphocytes.  There really is not much that we had to do. If she does not need a bone marrow test. I don't see that we have run any additional lab work on her.  I think her platelet count will clearly correlate with her cirrhosis and whether or not this worsens and whether not splenomegaly worsens.  We spent about 40 minutes with her. She is very nice. We answered all of her questions.  Lattie Haw, MD

## 2016-07-08 DIAGNOSIS — R799 Abnormal finding of blood chemistry, unspecified: Secondary | ICD-10-CM | POA: Diagnosis not present

## 2016-08-05 DIAGNOSIS — E038 Other specified hypothyroidism: Secondary | ICD-10-CM | POA: Diagnosis not present

## 2016-08-05 DIAGNOSIS — B182 Chronic viral hepatitis C: Secondary | ICD-10-CM | POA: Diagnosis not present

## 2016-08-05 DIAGNOSIS — F411 Generalized anxiety disorder: Secondary | ICD-10-CM | POA: Diagnosis not present

## 2016-08-05 DIAGNOSIS — M170 Bilateral primary osteoarthritis of knee: Secondary | ICD-10-CM | POA: Diagnosis not present

## 2016-08-05 DIAGNOSIS — J060 Acute laryngopharyngitis: Secondary | ICD-10-CM | POA: Diagnosis not present

## 2016-08-05 DIAGNOSIS — E782 Mixed hyperlipidemia: Secondary | ICD-10-CM | POA: Diagnosis not present

## 2016-08-05 DIAGNOSIS — I10 Essential (primary) hypertension: Secondary | ICD-10-CM | POA: Diagnosis not present

## 2016-09-10 DIAGNOSIS — I864 Gastric varices: Secondary | ICD-10-CM | POA: Diagnosis not present

## 2016-09-10 DIAGNOSIS — K7460 Unspecified cirrhosis of liver: Secondary | ICD-10-CM | POA: Diagnosis not present

## 2016-09-10 DIAGNOSIS — K766 Portal hypertension: Secondary | ICD-10-CM | POA: Diagnosis not present

## 2016-09-10 DIAGNOSIS — C220 Liver cell carcinoma: Secondary | ICD-10-CM | POA: Diagnosis not present

## 2016-09-10 DIAGNOSIS — R162 Hepatomegaly with splenomegaly, not elsewhere classified: Secondary | ICD-10-CM | POA: Diagnosis not present

## 2016-09-10 DIAGNOSIS — R161 Splenomegaly, not elsewhere classified: Secondary | ICD-10-CM | POA: Diagnosis not present

## 2016-09-10 DIAGNOSIS — R160 Hepatomegaly, not elsewhere classified: Secondary | ICD-10-CM | POA: Diagnosis not present

## 2016-11-01 ENCOUNTER — Other Ambulatory Visit: Payer: Medicare Other

## 2016-11-01 ENCOUNTER — Ambulatory Visit: Payer: Medicare Other | Admitting: Family

## 2016-11-01 DIAGNOSIS — B182 Chronic viral hepatitis C: Secondary | ICD-10-CM | POA: Diagnosis not present

## 2016-11-01 DIAGNOSIS — E038 Other specified hypothyroidism: Secondary | ICD-10-CM | POA: Diagnosis not present

## 2016-11-01 DIAGNOSIS — D6959 Other secondary thrombocytopenia: Secondary | ICD-10-CM | POA: Diagnosis not present

## 2016-11-01 DIAGNOSIS — M170 Bilateral primary osteoarthritis of knee: Secondary | ICD-10-CM | POA: Diagnosis not present

## 2016-11-01 DIAGNOSIS — I10 Essential (primary) hypertension: Secondary | ICD-10-CM | POA: Diagnosis not present

## 2016-11-01 DIAGNOSIS — E782 Mixed hyperlipidemia: Secondary | ICD-10-CM | POA: Diagnosis not present

## 2016-11-01 DIAGNOSIS — F411 Generalized anxiety disorder: Secondary | ICD-10-CM | POA: Diagnosis not present

## 2016-12-13 ENCOUNTER — Other Ambulatory Visit (HOSPITAL_BASED_OUTPATIENT_CLINIC_OR_DEPARTMENT_OTHER): Payer: PPO

## 2016-12-13 ENCOUNTER — Ambulatory Visit (HOSPITAL_BASED_OUTPATIENT_CLINIC_OR_DEPARTMENT_OTHER): Payer: PPO | Admitting: Family

## 2016-12-13 VITALS — BP 146/81 | HR 83 | Temp 98.2°F | Resp 20 | Wt 219.0 lb

## 2016-12-13 DIAGNOSIS — K7460 Unspecified cirrhosis of liver: Secondary | ICD-10-CM

## 2016-12-13 DIAGNOSIS — D696 Thrombocytopenia, unspecified: Secondary | ICD-10-CM | POA: Diagnosis not present

## 2016-12-13 DIAGNOSIS — Z8619 Personal history of other infectious and parasitic diseases: Secondary | ICD-10-CM

## 2016-12-13 LAB — COMPREHENSIVE METABOLIC PANEL (CC13)
ALBUMIN: 4.2 g/dL (ref 3.5–5.5)
ALT: 30 IU/L (ref 0–32)
AST (SGOT): 35 IU/L (ref 0–40)
Albumin/Globulin Ratio: 1.3 (ref 1.2–2.2)
Alkaline Phosphatase, S: 74 IU/L (ref 39–117)
BILIRUBIN TOTAL: 0.8 mg/dL (ref 0.0–1.2)
BUN / CREAT RATIO: 20 (ref 9–23)
BUN: 13 mg/dL (ref 6–24)
CO2: 26 mmol/L (ref 20–29)
CREATININE: 0.65 mg/dL (ref 0.57–1.00)
Calcium, Ser: 9.6 mg/dL (ref 8.7–10.2)
Chloride, Ser: 105 mmol/L (ref 96–106)
GFR calc non Af Amer: 100 mL/min/{1.73_m2} (ref >59)
GFR, EST AFRICAN AMERICAN: 115 mL/min/{1.73_m2} (ref >59)
Globulin, Total: 3.3 g/dL (ref 1.5–4.5)
Glucose: 92 mg/dL (ref 65–99)
Potassium, Ser: 3.6 mmol/L (ref 3.5–5.2)
Sodium: 140 mmol/L (ref 134–144)
TOTAL PROTEIN: 7.5 g/dL (ref 6.0–8.5)

## 2016-12-13 LAB — CBC WITH DIFFERENTIAL (CANCER CENTER ONLY)
BASO#: 0 10*3/uL (ref 0.0–0.2)
BASO%: 0.3 % (ref 0.0–2.0)
EOS ABS: 0.1 10*3/uL (ref 0.0–0.5)
EOS%: 1.5 % (ref 0.0–7.0)
HCT: 40.3 % (ref 34.8–46.6)
HGB: 14.1 g/dL (ref 11.6–15.9)
LYMPH#: 1.1 10*3/uL (ref 0.9–3.3)
LYMPH%: 16.6 % (ref 14.0–48.0)
MCH: 32.4 pg (ref 26.0–34.0)
MCHC: 35 g/dL (ref 32.0–36.0)
MCV: 93 fL (ref 81–101)
MONO#: 0.5 10*3/uL (ref 0.1–0.9)
MONO%: 7.5 % (ref 0.0–13.0)
NEUT#: 5 10*3/uL (ref 1.5–6.5)
NEUT%: 74.1 % (ref 39.6–80.0)
Platelets: 93 10*3/uL — ABNORMAL LOW (ref 145–400)
RBC: 4.35 10*6/uL (ref 3.70–5.32)
RDW: 13.8 % (ref 11.1–15.7)
WBC: 6.8 10*3/uL (ref 3.9–10.0)

## 2016-12-13 LAB — CHCC SATELLITE - SMEAR

## 2016-12-13 NOTE — Progress Notes (Signed)
Hematology and Oncology Follow Up Visit  Natalie Perry [id number] 1961/04/04 56 y.o. 12/13/2016   Principle Diagnosis:  1. Thrombocytopenia  2. Cirrhosis 3. Hepatitis C - completed treatment with Harvoni in 2016  4. Enlarged spleen    Current Therapy:   Observation   Interim History:  Natalie Perry is here today for follow-up. Unfortunately, last week she had a fall. She has several bruises on her right arm and leg. She states that she has problems with her left knee giving out. She wears a knee brace on the right. Thankfully she was not seriously injured but she states that she is sore "all over."  She denies having any episodes of bleeding. No petechiae. Platelet count today is 93. Hgb stable at 14.1 with an MCV of 93.  No lymphadenopathy found on exam.  She has some SOB and palpitations she states are due to COPD.  No fever, chills, n/v, cough, rash, dizziness, chest pain, abdominal pain or changes in bladder habits.  She has had diarrhea for several days. She states that her son has also been sick with this same symptom.  She has a good appetite but admits that she needs to hydrate better. Her weight is stable.   ECOG Performance Status: 1 - Symptomatic but completely ambulatory  Medications:  Allergies as of 12/13/2016      Reactions   Penicillins Hives, Swelling      Medication List       Accurate as of 12/13/16  1:27 PM. Always use your most recent med list.          albuterol 108 (90 Base) MCG/ACT inhaler Commonly known as:  PROVENTIL HFA;VENTOLIN HFA Inhale 2 puffs into the lungs every 6 (six) hours as needed. ASTHMA   ALPRAZolam 1 MG tablet Commonly known as:  XANAX Take 1 mg by mouth 3 (three) times daily as needed. Anxiety   budesonide-formoterol 160-4.5 MCG/ACT inhaler Commonly known as:  SYMBICORT Inhale 2 puffs into the lungs 2 (two) times daily.   chlorpheniramine-HYDROcodone 10-8 MG/5ML Lqcr Commonly known as:  TUSSIONEX Take 5 mLs by mouth at bedtime  as needed (cough).   HYDROcodone-acetaminophen 5-500 MG tablet Commonly known as:  VICODIN Take 1 tablet by mouth every 6 (six) hours as needed. Pain   levothyroxine 75 MCG tablet Commonly known as:  SYNTHROID, LEVOTHROID Take 75 mcg by mouth daily.   levothyroxine 50 MCG tablet Commonly known as:  SYNTHROID, LEVOTHROID Take 50 mcg by mouth daily.   loratadine 10 MG tablet Commonly known as:  CLARITIN Take 10 mg by mouth daily.   losartan-hydrochlorothiazide 100-25 MG tablet Commonly known as:  HYZAAR Take 1 tablet by mouth daily.   potassium chloride 10 MEQ CR capsule Commonly known as:  MICRO-K Take 10 mEq by mouth daily.   predniSONE 20 MG tablet Commonly known as:  DELTASONE Take 3 tablets (60 mg total) by mouth daily.   zolpidem 5 MG tablet Commonly known as:  AMBIEN Take 5 mg by mouth at bedtime.       Allergies:  Allergies  Allergen Reactions  . Penicillins Hives and Swelling    Past Medical History, Surgical history, Social history, and Family History were reviewed and updated.  Review of Systems: All other 10 point review of systems is negative.   Physical Exam:  vitals were not taken for this visit.  Wt Readings from Last 3 Encounters:  07/05/16 224 lb (101.6 kg)  07/31/08 202 lb 4 oz (91.7 kg)  02/02/08 180  lb 6.1 oz (81.8 kg)    Ocular: Sclerae unicteric, pupils equal, round and reactive to light Ear-nose-throat: Oropharynx clear, dentition fair Lymphatic: No cervical, supraclavicular or axillary adenopathy Lungs no rales or rhonchi, good excursion bilaterally Heart regular rate and rhythm, no murmur appreciated Abd soft, nontender, positive bowel sounds, no liver or spleen tip palpated on exam, no fluid wave MSK no focal spinal tenderness, no joint edema Neuro: non-focal, well-oriented, appropriate affect Breasts: Deferred   Lab Results  Component Value Date   WBC 8.3 07/05/2016   HGB 14.0 07/05/2016   HCT 39.8 07/05/2016   MCV 91  07/05/2016   PLT 116 Platelet count consistent in citrate (L) 07/05/2016   No results found for: FERRITIN, IRON, TIBC, UIBC, IRONPCTSAT Lab Results  Component Value Date   RBC 4.37 07/05/2016   No results found for: KPAFRELGTCHN, LAMBDASER, KAPLAMBRATIO No results found for: IGGSERUM, IGA, IGMSERUM No results found for: Ronnald Ramp, A1GS, A2GS, Violet Baldy, MSPIKE, SPEI   Chemistry      Component Value Date/Time   NA 132 (L) 07/05/2016 1242   K 3.6 07/05/2016 1242   CL 104 07/05/2016 1242   CO2 25 07/05/2016 1242   BUN 12 07/05/2016 1242   CREATININE 0.72 07/05/2016 1242      Component Value Date/Time   CALCIUM 9.4 07/05/2016 1242   ALKPHOS 73 07/05/2016 1242   AST 33 07/05/2016 1242   ALT 23 07/05/2016 1242   BILITOT 0.9 07/05/2016 1242      Impression and Plan: Natalie Perry is a very pleasant 56 yo caucasian female with history of thrombocytopenia secondary to cirrhosis and splenomegaly.  Her platelet count is stable at 93 and no anemia. WBC count is 6.8.  She did have a fall last week and has several healing bruises on her right arm and leg. She has had no episodes of bleeding.  We will continue to follow along with her and plan to see her back in another 6 months.  Greater than 50%of her 15 minute face to face visit was spent counseling and coordinating care.  She promises to contact our office with any questions or concerns. We can certainly see her sooner if need be.   Eliezer Bottom, NP 8/6/20181:27 PM

## 2016-12-13 NOTE — Patient Instructions (Signed)
Thrombocytopenia           Thrombocytopenia is a condition in which you have an abnormally decreased number of platelets in your blood. Platelets are also called thrombocytes. Platelets are needed for blood clotting. Some cases of thrombocytopenia are mild while others are more severe. What are the causes? This condition may be caused by:  Decreased production of platelets. This can be caused by: ? Aplastic anemia, in which your bone marrow quits making blood cells. ? Cancer in the bone marrow. ? Use of certain medicines, including chemotherapy. ? Infection in the bone marrow. ? Heavy alcohol consumption.  Increased destruction of platelets. This can be caused by: ? Certain immune diseases. ? Use of certain drugs. ? Certain blood clotting disorders. ? Certain inherited disorders. ? Certain bleeding disorders. ? Pregnancy.  Having an enlarged spleen (hypersplenism). In hypersplenism, the spleen gathers up platelets from circulation. This means that the platelets are not available to help with blood clotting. The spleen can be enlarged because of cirrhosis or other conditions.  What are the signs or symptoms? Symptoms of this condition are side effects of poor blood clotting. They will vary depending on how low the platelet counts are. Symptoms may include:  Abnormal bleeding.  Nosebleeds.  Heavy menstrual periods.  Blood in the urine or stool (feces).  A purplish discoloration in the skin (purpura).  Bruising.  A rash that looks like pinpoint, purplish-red spots (petechiae) on the skin and mucous membranes.  How is this diagnosed? This condition may be diagnosed with blood tests and a physical exam. Sometimes, a sample of bone marrow may be removed to look for the original cells (megakaryocytes) that make platelets. How is this treated? Treatment for this condition depends on the cause. Treatment options may include:  Treatment of another condition that is causing  the low platelet count.  Medicines to help protect your platelets from being destroyed.  A replacement (transfusion) of platelets to stop or prevent bleeding.  Surgery to remove the spleen.  Follow these instructions at home: General instructions  Check your skin and the linings inside your mouth for bruising or bleeding as told by your health care provider.  Check your sputum, urine, and stool for blood as told by your health care provider.  Ask your health care provider if it is okay for you to drink alcohol.  Take over-the-counter and prescription medicines only as told by your health care provider.  Tell all of your health care providers, including dentists and eye doctors, about your condition. Activity  Until your health care provider says it is okay. ? Do not return to any activities that could cause bumps or bruises.  Take extra care not to cut yourself when you shave or when you use scissors, needles, knives, and other tools.  Take extra care not to burn yourself when ironing or cooking. Contact a health care provider if:  You have unexplained bruising. Get help right away if:  You have active bleeding from anywhere on your body.  You have blood in your sputum, urine, or stool. This information is not intended to replace advice given to you by your health care provider. Make sure you discuss any questions you have with your health care provider. Document Released: 04/26/2005 Document Revised: 12/28/2015 Document Reviewed: 10/28/2014 Elsevier Interactive Patient Education  2018 [employer].

## 2016-12-23 DIAGNOSIS — J069 Acute upper respiratory infection, unspecified: Secondary | ICD-10-CM | POA: Diagnosis not present

## 2016-12-23 DIAGNOSIS — Z79899 Other long term (current) drug therapy: Secondary | ICD-10-CM | POA: Diagnosis not present

## 2016-12-23 DIAGNOSIS — R05 Cough: Secondary | ICD-10-CM | POA: Diagnosis not present

## 2016-12-23 DIAGNOSIS — J449 Chronic obstructive pulmonary disease, unspecified: Secondary | ICD-10-CM | POA: Diagnosis not present

## 2016-12-23 DIAGNOSIS — Z87891 Personal history of nicotine dependence: Secondary | ICD-10-CM | POA: Diagnosis not present

## 2016-12-24 DIAGNOSIS — R05 Cough: Secondary | ICD-10-CM | POA: Diagnosis not present

## 2016-12-27 DIAGNOSIS — J018 Other acute sinusitis: Secondary | ICD-10-CM | POA: Diagnosis not present

## 2016-12-27 DIAGNOSIS — J208 Acute bronchitis due to other specified organisms: Secondary | ICD-10-CM | POA: Diagnosis not present

## 2017-02-14 DIAGNOSIS — E038 Other specified hypothyroidism: Secondary | ICD-10-CM | POA: Diagnosis not present

## 2017-02-14 DIAGNOSIS — M170 Bilateral primary osteoarthritis of knee: Secondary | ICD-10-CM | POA: Diagnosis not present

## 2017-02-14 DIAGNOSIS — Z6841 Body Mass Index (BMI) 40.0 and over, adult: Secondary | ICD-10-CM | POA: Diagnosis not present

## 2017-02-14 DIAGNOSIS — I10 Essential (primary) hypertension: Secondary | ICD-10-CM | POA: Diagnosis not present

## 2017-02-14 DIAGNOSIS — B182 Chronic viral hepatitis C: Secondary | ICD-10-CM | POA: Diagnosis not present

## 2017-02-14 DIAGNOSIS — E782 Mixed hyperlipidemia: Secondary | ICD-10-CM | POA: Diagnosis not present

## 2017-02-14 DIAGNOSIS — R799 Abnormal finding of blood chemistry, unspecified: Secondary | ICD-10-CM | POA: Diagnosis not present

## 2017-02-14 DIAGNOSIS — D6959 Other secondary thrombocytopenia: Secondary | ICD-10-CM | POA: Diagnosis not present

## 2017-02-14 DIAGNOSIS — F411 Generalized anxiety disorder: Secondary | ICD-10-CM | POA: Diagnosis not present

## 2017-02-14 DIAGNOSIS — Z23 Encounter for immunization: Secondary | ICD-10-CM | POA: Diagnosis not present

## 2017-02-14 DIAGNOSIS — R5383 Other fatigue: Secondary | ICD-10-CM | POA: Diagnosis not present

## 2017-05-18 DIAGNOSIS — F411 Generalized anxiety disorder: Secondary | ICD-10-CM | POA: Diagnosis not present

## 2017-05-18 DIAGNOSIS — N3001 Acute cystitis with hematuria: Secondary | ICD-10-CM | POA: Diagnosis not present

## 2017-05-18 DIAGNOSIS — B182 Chronic viral hepatitis C: Secondary | ICD-10-CM | POA: Diagnosis not present

## 2017-05-18 DIAGNOSIS — M170 Bilateral primary osteoarthritis of knee: Secondary | ICD-10-CM | POA: Diagnosis not present

## 2017-05-18 DIAGNOSIS — E038 Other specified hypothyroidism: Secondary | ICD-10-CM | POA: Diagnosis not present

## 2017-05-18 DIAGNOSIS — I10 Essential (primary) hypertension: Secondary | ICD-10-CM | POA: Diagnosis not present

## 2017-05-18 DIAGNOSIS — R5383 Other fatigue: Secondary | ICD-10-CM | POA: Diagnosis not present

## 2017-05-18 DIAGNOSIS — Z1231 Encounter for screening mammogram for malignant neoplasm of breast: Secondary | ICD-10-CM | POA: Diagnosis not present

## 2017-05-18 DIAGNOSIS — Z13820 Encounter for screening for osteoporosis: Secondary | ICD-10-CM | POA: Diagnosis not present

## 2017-05-18 DIAGNOSIS — D6959 Other secondary thrombocytopenia: Secondary | ICD-10-CM | POA: Diagnosis not present

## 2017-06-03 DIAGNOSIS — R161 Splenomegaly, not elsewhere classified: Secondary | ICD-10-CM | POA: Diagnosis not present

## 2017-06-03 DIAGNOSIS — I864 Gastric varices: Secondary | ICD-10-CM | POA: Diagnosis not present

## 2017-06-03 DIAGNOSIS — R160 Hepatomegaly, not elsewhere classified: Secondary | ICD-10-CM | POA: Diagnosis not present

## 2017-06-03 DIAGNOSIS — K766 Portal hypertension: Secondary | ICD-10-CM | POA: Diagnosis not present

## 2017-06-03 DIAGNOSIS — K7469 Other cirrhosis of liver: Secondary | ICD-10-CM | POA: Diagnosis not present

## 2017-06-06 DIAGNOSIS — Z1231 Encounter for screening mammogram for malignant neoplasm of breast: Secondary | ICD-10-CM | POA: Diagnosis not present

## 2017-06-06 DIAGNOSIS — N958 Other specified menopausal and perimenopausal disorders: Secondary | ICD-10-CM | POA: Diagnosis not present

## 2017-06-06 DIAGNOSIS — M85852 Other specified disorders of bone density and structure, left thigh: Secondary | ICD-10-CM | POA: Diagnosis not present

## 2017-06-15 ENCOUNTER — Ambulatory Visit: Payer: PPO | Admitting: Family

## 2017-06-15 ENCOUNTER — Other Ambulatory Visit: Payer: PPO

## 2017-08-18 DIAGNOSIS — E038 Other specified hypothyroidism: Secondary | ICD-10-CM | POA: Diagnosis not present

## 2017-08-18 DIAGNOSIS — D6959 Other secondary thrombocytopenia: Secondary | ICD-10-CM | POA: Diagnosis not present

## 2017-08-18 DIAGNOSIS — F411 Generalized anxiety disorder: Secondary | ICD-10-CM | POA: Diagnosis not present

## 2017-08-18 DIAGNOSIS — N3001 Acute cystitis with hematuria: Secondary | ICD-10-CM | POA: Diagnosis not present

## 2017-08-18 DIAGNOSIS — I10 Essential (primary) hypertension: Secondary | ICD-10-CM | POA: Diagnosis not present

## 2017-08-18 DIAGNOSIS — B182 Chronic viral hepatitis C: Secondary | ICD-10-CM | POA: Diagnosis not present

## 2017-08-18 DIAGNOSIS — M170 Bilateral primary osteoarthritis of knee: Secondary | ICD-10-CM | POA: Diagnosis not present

## 2017-08-18 DIAGNOSIS — N3000 Acute cystitis without hematuria: Secondary | ICD-10-CM | POA: Diagnosis not present

## 2017-08-18 DIAGNOSIS — R5383 Other fatigue: Secondary | ICD-10-CM | POA: Diagnosis not present

## 2017-12-14 DIAGNOSIS — E782 Mixed hyperlipidemia: Secondary | ICD-10-CM | POA: Diagnosis not present

## 2017-12-14 DIAGNOSIS — E038 Other specified hypothyroidism: Secondary | ICD-10-CM | POA: Diagnosis not present

## 2017-12-14 DIAGNOSIS — I10 Essential (primary) hypertension: Secondary | ICD-10-CM | POA: Diagnosis not present

## 2017-12-14 DIAGNOSIS — R5383 Other fatigue: Secondary | ICD-10-CM | POA: Diagnosis not present

## 2018-02-27 DIAGNOSIS — M170 Bilateral primary osteoarthritis of knee: Secondary | ICD-10-CM | POA: Diagnosis not present

## 2018-03-02 DIAGNOSIS — M170 Bilateral primary osteoarthritis of knee: Secondary | ICD-10-CM | POA: Diagnosis not present

## 2018-03-20 DIAGNOSIS — E782 Mixed hyperlipidemia: Secondary | ICD-10-CM | POA: Diagnosis not present

## 2018-03-20 DIAGNOSIS — I10 Essential (primary) hypertension: Secondary | ICD-10-CM | POA: Diagnosis not present

## 2018-03-20 DIAGNOSIS — D6959 Other secondary thrombocytopenia: Secondary | ICD-10-CM | POA: Diagnosis not present

## 2018-03-20 DIAGNOSIS — B182 Chronic viral hepatitis C: Secondary | ICD-10-CM | POA: Diagnosis not present

## 2018-03-20 DIAGNOSIS — M170 Bilateral primary osteoarthritis of knee: Secondary | ICD-10-CM | POA: Diagnosis not present

## 2018-03-20 DIAGNOSIS — E038 Other specified hypothyroidism: Secondary | ICD-10-CM | POA: Diagnosis not present

## 2018-03-20 DIAGNOSIS — J060 Acute laryngopharyngitis: Secondary | ICD-10-CM | POA: Diagnosis not present

## 2018-03-20 DIAGNOSIS — F411 Generalized anxiety disorder: Secondary | ICD-10-CM | POA: Diagnosis not present

## 2018-03-29 DIAGNOSIS — Z9081 Acquired absence of spleen: Secondary | ICD-10-CM | POA: Diagnosis not present

## 2018-03-29 DIAGNOSIS — D6959 Other secondary thrombocytopenia: Secondary | ICD-10-CM | POA: Diagnosis not present

## 2018-03-29 DIAGNOSIS — R161 Splenomegaly, not elsewhere classified: Secondary | ICD-10-CM | POA: Diagnosis not present

## 2018-03-29 DIAGNOSIS — Z79899 Other long term (current) drug therapy: Secondary | ICD-10-CM | POA: Diagnosis not present

## 2018-03-29 DIAGNOSIS — B182 Chronic viral hepatitis C: Secondary | ICD-10-CM | POA: Diagnosis not present

## 2018-03-29 DIAGNOSIS — D696 Thrombocytopenia, unspecified: Secondary | ICD-10-CM | POA: Diagnosis not present

## 2018-03-29 DIAGNOSIS — K7460 Unspecified cirrhosis of liver: Secondary | ICD-10-CM | POA: Diagnosis not present

## 2018-03-29 DIAGNOSIS — K7469 Other cirrhosis of liver: Secondary | ICD-10-CM | POA: Diagnosis not present

## 2018-06-21 DIAGNOSIS — D6959 Other secondary thrombocytopenia: Secondary | ICD-10-CM | POA: Diagnosis not present

## 2018-06-21 DIAGNOSIS — F411 Generalized anxiety disorder: Secondary | ICD-10-CM | POA: Diagnosis not present

## 2018-06-21 DIAGNOSIS — B182 Chronic viral hepatitis C: Secondary | ICD-10-CM | POA: Diagnosis not present

## 2018-06-21 DIAGNOSIS — E038 Other specified hypothyroidism: Secondary | ICD-10-CM | POA: Diagnosis not present

## 2018-06-21 DIAGNOSIS — M170 Bilateral primary osteoarthritis of knee: Secondary | ICD-10-CM | POA: Diagnosis not present

## 2018-06-21 DIAGNOSIS — R5383 Other fatigue: Secondary | ICD-10-CM | POA: Diagnosis not present

## 2018-06-21 DIAGNOSIS — I10 Essential (primary) hypertension: Secondary | ICD-10-CM | POA: Diagnosis not present

## 2018-07-09 DIAGNOSIS — J301 Allergic rhinitis due to pollen: Secondary | ICD-10-CM | POA: Diagnosis not present

## 2018-08-28 DIAGNOSIS — Z0000 Encounter for general adult medical examination without abnormal findings: Secondary | ICD-10-CM | POA: Diagnosis not present

## 2018-08-28 DIAGNOSIS — Z1211 Encounter for screening for malignant neoplasm of colon: Secondary | ICD-10-CM | POA: Diagnosis not present

## 2018-08-28 DIAGNOSIS — Z6836 Body mass index (BMI) 36.0-36.9, adult: Secondary | ICD-10-CM | POA: Diagnosis not present

## 2018-09-11 DIAGNOSIS — Z1211 Encounter for screening for malignant neoplasm of colon: Secondary | ICD-10-CM | POA: Diagnosis not present

## 2018-09-14 LAB — COLOGUARD: Cologuard: NEGATIVE

## 2018-09-20 DIAGNOSIS — D6959 Other secondary thrombocytopenia: Secondary | ICD-10-CM | POA: Diagnosis not present

## 2018-09-20 DIAGNOSIS — M170 Bilateral primary osteoarthritis of knee: Secondary | ICD-10-CM | POA: Diagnosis not present

## 2018-09-20 DIAGNOSIS — J449 Chronic obstructive pulmonary disease, unspecified: Secondary | ICD-10-CM | POA: Diagnosis not present

## 2018-09-20 DIAGNOSIS — I10 Essential (primary) hypertension: Secondary | ICD-10-CM | POA: Diagnosis not present

## 2018-09-20 DIAGNOSIS — B182 Chronic viral hepatitis C: Secondary | ICD-10-CM | POA: Diagnosis not present

## 2018-09-20 DIAGNOSIS — E038 Other specified hypothyroidism: Secondary | ICD-10-CM | POA: Diagnosis not present

## 2018-09-20 DIAGNOSIS — F411 Generalized anxiety disorder: Secondary | ICD-10-CM | POA: Diagnosis not present

## 2018-12-25 DIAGNOSIS — M170 Bilateral primary osteoarthritis of knee: Secondary | ICD-10-CM | POA: Diagnosis not present

## 2018-12-25 DIAGNOSIS — Z1231 Encounter for screening mammogram for malignant neoplasm of breast: Secondary | ICD-10-CM | POA: Diagnosis not present

## 2018-12-25 DIAGNOSIS — F411 Generalized anxiety disorder: Secondary | ICD-10-CM | POA: Diagnosis not present

## 2018-12-25 DIAGNOSIS — J449 Chronic obstructive pulmonary disease, unspecified: Secondary | ICD-10-CM | POA: Diagnosis not present

## 2018-12-25 DIAGNOSIS — I10 Essential (primary) hypertension: Secondary | ICD-10-CM | POA: Diagnosis not present

## 2018-12-25 DIAGNOSIS — B182 Chronic viral hepatitis C: Secondary | ICD-10-CM | POA: Diagnosis not present

## 2018-12-25 DIAGNOSIS — E782 Mixed hyperlipidemia: Secondary | ICD-10-CM | POA: Diagnosis not present

## 2018-12-25 DIAGNOSIS — D6959 Other secondary thrombocytopenia: Secondary | ICD-10-CM | POA: Diagnosis not present

## 2018-12-25 DIAGNOSIS — E038 Other specified hypothyroidism: Secondary | ICD-10-CM | POA: Diagnosis not present

## 2019-03-01 DIAGNOSIS — M1711 Unilateral primary osteoarthritis, right knee: Secondary | ICD-10-CM | POA: Diagnosis not present

## 2019-03-28 DIAGNOSIS — E038 Other specified hypothyroidism: Secondary | ICD-10-CM | POA: Diagnosis not present

## 2019-03-28 DIAGNOSIS — J449 Chronic obstructive pulmonary disease, unspecified: Secondary | ICD-10-CM | POA: Diagnosis not present

## 2019-03-28 DIAGNOSIS — M170 Bilateral primary osteoarthritis of knee: Secondary | ICD-10-CM | POA: Diagnosis not present

## 2019-03-28 DIAGNOSIS — Z1231 Encounter for screening mammogram for malignant neoplasm of breast: Secondary | ICD-10-CM | POA: Diagnosis not present

## 2019-03-28 DIAGNOSIS — D6959 Other secondary thrombocytopenia: Secondary | ICD-10-CM | POA: Diagnosis not present

## 2019-03-28 DIAGNOSIS — B182 Chronic viral hepatitis C: Secondary | ICD-10-CM | POA: Diagnosis not present

## 2019-03-28 DIAGNOSIS — E782 Mixed hyperlipidemia: Secondary | ICD-10-CM | POA: Diagnosis not present

## 2019-03-28 DIAGNOSIS — E559 Vitamin D deficiency, unspecified: Secondary | ICD-10-CM | POA: Diagnosis not present

## 2019-03-28 DIAGNOSIS — F411 Generalized anxiety disorder: Secondary | ICD-10-CM | POA: Diagnosis not present

## 2019-03-28 DIAGNOSIS — I10 Essential (primary) hypertension: Secondary | ICD-10-CM | POA: Diagnosis not present

## 2019-03-28 LAB — HM MAMMOGRAPHY: HM Mammogram: NORMAL (ref 0–4)

## 2019-03-30 DIAGNOSIS — D696 Thrombocytopenia, unspecified: Secondary | ICD-10-CM | POA: Diagnosis not present

## 2019-06-12 ENCOUNTER — Other Ambulatory Visit: Payer: Self-pay | Admitting: Physician Assistant

## 2019-07-02 ENCOUNTER — Ambulatory Visit (INDEPENDENT_AMBULATORY_CARE_PROVIDER_SITE_OTHER): Payer: PPO | Admitting: Physician Assistant

## 2019-07-02 ENCOUNTER — Other Ambulatory Visit: Payer: Self-pay

## 2019-07-02 ENCOUNTER — Encounter: Payer: Self-pay | Admitting: Physician Assistant

## 2019-07-02 ENCOUNTER — Other Ambulatory Visit: Payer: Self-pay | Admitting: Physician Assistant

## 2019-07-02 VITALS — BP 142/80 | HR 96 | Temp 97.5°F | Resp 16 | Ht 63.0 in | Wt 218.0 lb

## 2019-07-02 DIAGNOSIS — R300 Dysuria: Secondary | ICD-10-CM | POA: Insufficient documentation

## 2019-07-02 DIAGNOSIS — J4540 Moderate persistent asthma, uncomplicated: Secondary | ICD-10-CM | POA: Diagnosis not present

## 2019-07-02 DIAGNOSIS — E559 Vitamin D deficiency, unspecified: Secondary | ICD-10-CM

## 2019-07-02 DIAGNOSIS — I10 Essential (primary) hypertension: Secondary | ICD-10-CM

## 2019-07-02 DIAGNOSIS — D696 Thrombocytopenia, unspecified: Secondary | ICD-10-CM | POA: Diagnosis not present

## 2019-07-02 DIAGNOSIS — R358 Other polyuria: Secondary | ICD-10-CM | POA: Diagnosis not present

## 2019-07-02 DIAGNOSIS — R3589 Other polyuria: Secondary | ICD-10-CM

## 2019-07-02 DIAGNOSIS — E038 Other specified hypothyroidism: Secondary | ICD-10-CM

## 2019-07-02 LAB — POCT URINALYSIS DIPSTICK
Appearance: NORMAL
Bilirubin, UA: NEGATIVE
Blood, UA: NEGATIVE
Glucose, UA: NEGATIVE
Ketones, UA: NEGATIVE
Leukocytes, UA: NEGATIVE
Nitrite, UA: NEGATIVE
Protein, UA: NEGATIVE
Spec Grav, UA: 1.015 (ref 1.010–1.025)
Urobilinogen, UA: NEGATIVE E.U./dL — AB
pH, UA: 5 (ref 5.0–8.0)

## 2019-07-02 MED ORDER — PROMETHAZINE HCL 25 MG PO TABS: 25.0000 mg | ORAL_TABLET | Freq: Three times a day (TID) | ORAL | 2 refills | Status: DC | PRN

## 2019-07-02 MED ORDER — VITAMIN D (ERGOCALCIFEROL) 1.25 MG (50000 UNIT) PO CAPS: 50000.0000 [IU] | ORAL_CAPSULE | ORAL | 1 refills | Status: DC

## 2019-07-02 MED ORDER — OXYCODONE HCL 5 MG PO TABS: 5.0000 mg | ORAL_TABLET | Freq: Two times a day (BID) | ORAL | 0 refills | Status: DC | PRN

## 2019-07-02 MED ORDER — DOXYCYCLINE HYCLATE 100 MG PO TABS: 100.0000 mg | ORAL_TABLET | Freq: Two times a day (BID) | ORAL | 0 refills | Status: DC

## 2019-07-02 NOTE — Assessment & Plan Note (Signed)
ua normal Recommend limiting sodas/carbonated drinks

## 2019-07-02 NOTE — Assessment & Plan Note (Signed)
Vit D level pending rx for ergocalciferol sent

## 2019-07-02 NOTE — Assessment & Plan Note (Signed)
Continue current meds as directed 

## 2019-07-02 NOTE — Assessment & Plan Note (Signed)
Continue follow up with Dr Bobby Rumpf as scheduled

## 2019-07-02 NOTE — Assessment & Plan Note (Signed)
Continue current meds as directed labwork pending

## 2019-07-02 NOTE — Assessment & Plan Note (Signed)
TSH pending. 

## 2019-07-02 NOTE — Progress Notes (Signed)
Established Patient Office Visit  Subjective:  Patient ID: Natalie Perry, female    DOB: 1961-05-07  Age: 59 y.o. MRN: [id number]  CC:  Chief Complaint  Patient presents with  . Follow-up  . Hypothyroidism    HPI Natalie Perry presents for follow up chronic health problems  Pt for follow up of hypertension - she is tolerating medication well - currently on losartan/hctz 100/25 qd - states bp at home 120-130/70s - she denies chest pain or dyspnea  Pt with history of asthma - current on stable medications of symbicort, albuterol and nebulizer - voices no problems or concerns  Pt currently on 151mcg of levothyroxine - due to check TSH  Pt with chronic history of pain in knee and back - she is not candidate for surgery - uses mobic 7.5mg  bid and oxycodone as needed  Pt with history of thrombocytopenia - does follow with hematology regularly  Past Medical History:  Diagnosis Date  . Arthritis   . Asthma   . Bipolar affective disorder ([place])   . Cirrhosis of liver due to hepatitis C   . Thyroid disease     Past Surgical History:  Procedure Laterality Date  . ABDOMINAL HYSTERECTOMY    . ABDOMINAL SURGERY    . APPENDECTOMY    . CHOLECYSTECTOMY      Family History  Problem Relation Age of Onset  . Asthma Mother   . Bipolar disorder Mother   . CAD Father   . Hypertension Father     Social History   Socioeconomic History  . Marital status: Divorced    Spouse name: Not on file  . Number of children: 2  . Years of education: Not on file  . Highest education level: Not on file  Occupational History  . Occupation: disabled  Tobacco Use  . Smoking status: Never Smoker  . Smokeless tobacco: Never Used  Substance and Sexual Activity  . Alcohol use: No  . Drug use: Never  . Sexual activity: Not on file  Other Topics Concern  . Not on file  Social History Narrative  . Not on file   Social Determinants of Health   Financial Resource  Strain:   . Difficulty of Paying Living Expenses: Not on file  Food Insecurity:   . Worried About Charity fundraiser in the Last Year: Not on file  . Ran Out of Food in the Last Year: Not on file  Transportation Needs:   . Lack of Transportation (Medical): Not on file  . Lack of Transportation (Non-Medical): Not on file  Physical Activity:   . Days of Exercise per Week: Not on file  . Minutes of Exercise per Session: Not on file  Stress:   . Feeling of Stress : Not on file  Social Connections:   . Frequency of Communication with Friends and Family: Not on file  . Frequency of Social Gatherings with Friends and Family: Not on file  . Attends Religious Services: Not on file  . Active Member of Clubs or Organizations: Not on file  . Attends Archivist Meetings: Not on file  . Marital Status: Not on file  Intimate Partner Violence:   . Fear of Current or Ex-Partner: Not on file  . Emotionally Abused: Not on file  . Physically Abused: Not on file  . Sexually Abused: Not on file     Current Outpatient Medications:  .  citalopram (CELEXA) 20 MG tablet, Take by mouth.,  Disp: , Rfl:  .  metoCLOPramide (REGLAN) 10 MG tablet, Take by mouth., Disp: , Rfl:  .  promethazine (PHENERGAN) 25 MG tablet, Take 1 tablet (25 mg total) by mouth every 8 (eight) hours as needed for nausea or vomiting., Disp: 30 tablet, Rfl: 2 .  albuterol (PROVENTIL HFA;VENTOLIN HFA) 108 (90 BASE) MCG/ACT inhaler, Inhale 2 puffs into the lungs every 6 (six) hours as needed. ASTHMA, Disp: , Rfl:  .  albuterol (PROVENTIL) (2.5 MG/3ML) 0.083% nebulizer solution, , Disp: , Rfl:  .  betamethasone dipropionate (DIPROLENE) 0.05 % ointment, APPLY SMALL AMOUNT EXTERNALLY TO THE AFFECTED AREA DAILY, Disp: , Rfl:  .  budesonide-formoterol (SYMBICORT) 160-4.5 MCG/ACT inhaler, Inhale 2 puffs into the lungs 2 (two) times daily., Disp: , Rfl:  .  busPIRone (BUSPAR) 15 MG tablet, TK 1/2 T PO BID, Disp: , Rfl: 1 .   HYDROcodone-acetaminophen (VICODIN) 5-500 MG per tablet, Take 1 tablet by mouth every 6 (six) hours as needed. Pain, Disp: , Rfl:  .  lactulose (CHRONULAC) 10 GM/15ML solution, , Disp: , Rfl:  .  levothyroxine (SYNTHROID) 100 MCG tablet, TAKE 1 TABLET(100 MCG) BY MOUTH EVERY DAY, Disp: 30 tablet, Rfl: 3 .  loratadine (CLARITIN) 10 MG tablet, Take by mouth., Disp: , Rfl:  .  LORazepam (ATIVAN) 1 MG tablet, Take 1 mg by mouth 3 (three) times daily., Disp: , Rfl:  .  losartan-hydrochlorothiazide (HYZAAR) 100-25 MG per tablet, Take 1 tablet by mouth daily., Disp: , Rfl:  .  oxyCODONE (OXY IR/ROXICODONE) 5 MG immediate release tablet, Take 1 tablet (5 mg total) by mouth 2 (two) times daily as needed., Disp: 60 tablet, Rfl: 0 .  potassium chloride SA (K-DUR,KLOR-CON) 20 MEQ tablet, , Disp: , Rfl:  .  Vitamin D, Ergocalciferol, (DRISDOL) 1.25 MG (50000 UNIT) CAPS capsule, Take 1 capsule (50,000 Units total) by mouth every 7 (seven) days., Disp: 12 capsule, Rfl: 1   Allergies  Allergen Reactions  . Penicillins Hives and Swelling    ROS CONSTITUTIONAL: Negative for chills, fatigue, fever, unintentional weight gain and unintentional weight loss.  E/N/T: Negative for ear pain, nasal congestion and sore throat.  CARDIOVASCULAR: Negative for chest pain, dizziness, palpitations and pedal edema.  RESPIRATORY: Negative for recent cough and dyspnea.  GASTROINTESTINAL: Negative for abdominal pain, acid reflux symptoms, constipation, diarrhea, nausea and vomiting.  MSK: see HPI INTEGUMENTARY: Negative for rash.  NEUROLOGICAL: Negative for dizziness and headaches.  PSYCHIATRIC: Negative for sleep disturbance and to question depression screen.  Negative for depression, negative for anhedonia.     Office Visit on 07/02/2019  Component Date Value Ref Range Status  . Color, UA 07/02/2019 yellow   Final  . Clarity, UA 07/02/2019 clear   Final  . Glucose, UA 07/02/2019 Negative  Negative Final  . Bilirubin, UA  07/02/2019 neg   Final  . Ketones, UA 07/02/2019 neg   Final  . Spec Grav, UA 07/02/2019 1.015  1.010 - 1.025 Final  . Blood, UA 07/02/2019 neg   Final  . pH, UA 07/02/2019 5.0  5.0 - 8.0 Final  . Protein, UA 07/02/2019 Negative  Negative Final  . Urobilinogen, UA 07/02/2019 negative* 0.2 or 1.0 E.U./dL Final  . Nitrite, UA 07/02/2019 neg   Final  . Leukocytes, UA 07/02/2019 Negative  Negative Final  . Appearance 07/02/2019 normal   Final  . Odor 07/02/2019 none   Final      Objective:    PHYSICAL EXAM:   VS: BP (!) 142/80  Pulse 96   Temp (!) 97.5 F (36.4 C)   Resp 16   Ht 5\' 3"  (1.6 m)   Wt 218 lb (98.9 kg)   SpO2 93%   BMI 38.62 kg/m   GEN: Well nourished, well developed, in no acute distress  HEENT: normal external ears and nose - normal external auditory canals and TMS - hearing grossly normal - normal nasal mucosa and septum - Lips, Teeth and Gums - normal  Oropharynx - normal mucosa, palate, and posterior pharynx Neck: no JVD or masses - no thyromegaly Cardiac: RRR; no murmurs, rubs, or gallops,no edema - no significant varicosities Respiratory:  normal respiratory rate and pattern with no distress - normal breath sounds with no rales, rhonchi, wheezes or rubs GI: normal bowel sounds, no masses or tenderness MS: no deformity or atrophy - walks with cane - slowed Skin: warm and dry, no rash  Neuro:  Alert and Oriented x 3, Strength and sensation are intact - CN II-Xii grossly intact Psych: euthymic mood, appropriate affect and demeanor  BP (!) 142/80   Pulse 96   Temp (!) 97.5 F (36.4 C)   Resp 16   Ht 5\' 3"  (1.6 m)   Wt 218 lb (98.9 kg)   SpO2 93%   BMI 38.62 kg/m  Wt Readings from Last 3 Encounters:  07/02/19 218 lb (98.9 kg)  12/13/16 219 lb (99.3 kg)  07/05/16 224 lb (101.6 kg)     Health Maintenance Due  Topic Date Due  . HIV Screening  01/22/1975  . TETANUS/TDAP  01/22/1979  . PAP SMEAR-Modifier  01/21/1981  . MAMMOGRAM  01/21/2010  .  COLONOSCOPY  01/21/2010    There are no preventive care reminders to display for this patient.  No results found for: TSH Lab Results  Component Value Date   WBC 6.8 12/13/2016   HGB 14.1 12/13/2016   HCT 40.3 12/13/2016   MCV 93 12/13/2016   PLT 93 Platelet count consistent in citrate (L) 12/13/2016   Lab Results  Component Value Date   NA 140 12/13/2016   K 3.6 12/13/2016   CO2 26 12/13/2016   GLUCOSE 92 12/13/2016   BUN 13 12/13/2016   CREATININE 0.65 12/13/2016   BILITOT 0.8 12/13/2016   ALKPHOS 74 12/13/2016   AST 35 12/13/2016   ALT 30 12/13/2016   PROT 7.5 12/13/2016   ALBUMIN 4.2 12/13/2016   CALCIUM 9.6 12/13/2016   No results found for: CHOL No results found for: HDL No results found for: LDLCALC No results found for: TRIG No results found for: CHOLHDL No results found for: HGBA1C    Assessment & Plan:   Problem List Items Addressed This Visit      Cardiovascular and Mediastinum   Benign hypertension    Continue current meds as directed labwork pending      Relevant Orders   CBC with Differential/Platelet   Comprehensive metabolic panel     Respiratory   Asthma    Continue current meds as directed        Endocrine   Hypothyroid    TSH pending      Relevant Orders   TSH     Other   Thrombocytopenia ([place])    Continue follow up with Dr Bobby Rumpf as scheduled      Vitamin D deficiency    Vit D level pending rx for ergocalciferol sent      Relevant Orders   VITAMIN D 25 Hydroxy (Vit-D Deficiency, Fractures)  RESOLVED: Dysuria - Primary    ua normal Recommend limiting sodas/carbonated drinks         Meds ordered this encounter  Medications  . oxyCODONE (OXY IR/ROXICODONE) 5 MG immediate release tablet    Sig: Take 1 tablet (5 mg total) by mouth 2 (two) times daily as needed.    Dispense:  60 tablet    Refill:  0    Order Specific Question:   Supervising Provider    AnswerRochel Brome [id number]  . promethazine  (PHENERGAN) 25 MG tablet    Sig: Take 1 tablet (25 mg total) by mouth every 8 (eight) hours as needed for nausea or vomiting.    Dispense:  30 tablet    Refill:  2    Order Specific Question:   Supervising Provider    AnswerRochel Brome [id number]  . Vitamin D, Ergocalciferol, (DRISDOL) 1.25 MG (50000 UNIT) CAPS capsule    Sig: Take 1 capsule (50,000 Units total) by mouth every 7 (seven) days.    Dispense:  12 capsule    Refill:  1    Order Specific Question:   Supervising Provider    Answer:   Shelton Silvas    Follow-up: Return in about 3 months (around 09/29/2019).    SARA R Ammiel Guiney, PA-C

## 2019-07-03 LAB — CBC WITH DIFFERENTIAL/PLATELET
Basophils Absolute: 0 10*3/uL (ref 0.0–0.2)
Basos: 1 %
EOS (ABSOLUTE): 0.2 10*3/uL (ref 0.0–0.4)
Eos: 4 %
Hematocrit: 37.6 % (ref 34.0–46.6)
Hemoglobin: 12.8 g/dL (ref 11.1–15.9)
Immature Grans (Abs): 0 10*3/uL (ref 0.0–0.1)
Immature Granulocytes: 0 %
Lymphocytes Absolute: 1 10*3/uL (ref 0.7–3.1)
Lymphs: 16 %
MCH: 30.8 pg (ref 26.6–33.0)
MCHC: 34 g/dL (ref 31.5–35.7)
MCV: 90 fL (ref 79–97)
Monocytes Absolute: 0.5 10*3/uL (ref 0.1–0.9)
Monocytes: 7 %
Neutrophils Absolute: 4.6 10*3/uL (ref 1.4–7.0)
Neutrophils: 72 %
Platelets: 87 10*3/uL — CL (ref 150–450)
RBC: 4.16 x10E6/uL (ref 3.77–5.28)
RDW: 12.4 % (ref 11.7–15.4)
WBC: 6.4 10*3/uL (ref 3.4–10.8)

## 2019-07-03 LAB — COMPREHENSIVE METABOLIC PANEL
ALT: 26 IU/L (ref 0–32)
AST: 37 IU/L (ref 0–40)
Albumin/Globulin Ratio: 1.8 (ref 1.2–2.2)
Albumin: 4.3 g/dL (ref 3.8–4.9)
Alkaline Phosphatase: 74 IU/L (ref 39–117)
BUN/Creatinine Ratio: 18 (ref 9–23)
BUN: 13 mg/dL (ref 6–24)
Bilirubin Total: 1.2 mg/dL (ref 0.0–1.2)
CO2: 23 mmol/L (ref 20–29)
Calcium: 9.4 mg/dL (ref 8.7–10.2)
Chloride: 107 mmol/L — ABNORMAL HIGH (ref 96–106)
Creatinine, Ser: 0.73 mg/dL (ref 0.57–1.00)
GFR calc Af Amer: 104 mL/min/{1.73_m2} (ref >59)
GFR calc non Af Amer: 90 mL/min/{1.73_m2} (ref >59)
Globulin, Total: 2.4 g/dL (ref 1.5–4.5)
Glucose: 87 mg/dL (ref 65–99)
Potassium: 4 mmol/L (ref 3.5–5.2)
Sodium: 144 mmol/L (ref 134–144)
Total Protein: 6.7 g/dL (ref 6.0–8.5)

## 2019-07-03 LAB — TSH: TSH: 2.05 u[IU]/mL (ref 0.450–4.500)

## 2019-07-03 LAB — VITAMIN D 25 HYDROXY (VIT D DEFICIENCY, FRACTURES): Vit D, 25-Hydroxy: 29.4 ng/mL — ABNORMAL LOW (ref 30.0–100.0)

## 2019-07-11 ENCOUNTER — Other Ambulatory Visit: Payer: Self-pay | Admitting: Physician Assistant

## 2019-08-11 ENCOUNTER — Other Ambulatory Visit: Payer: Self-pay | Admitting: Physician Assistant

## 2019-09-25 ENCOUNTER — Other Ambulatory Visit: Payer: Self-pay | Admitting: Physician Assistant

## 2019-10-02 ENCOUNTER — Ambulatory Visit: Payer: PPO | Admitting: Physician Assistant

## 2019-10-05 DIAGNOSIS — B029 Zoster without complications: Secondary | ICD-10-CM | POA: Diagnosis not present

## 2019-10-08 ENCOUNTER — Other Ambulatory Visit: Payer: Self-pay | Admitting: Physician Assistant

## 2019-10-08 DIAGNOSIS — D696 Thrombocytopenia, unspecified: Secondary | ICD-10-CM

## 2019-10-08 DIAGNOSIS — K7460 Unspecified cirrhosis of liver: Secondary | ICD-10-CM

## 2019-10-12 ENCOUNTER — Ambulatory Visit (INDEPENDENT_AMBULATORY_CARE_PROVIDER_SITE_OTHER): Payer: PPO | Admitting: Physician Assistant

## 2019-10-12 ENCOUNTER — Encounter: Payer: Self-pay | Admitting: Physician Assistant

## 2019-10-12 ENCOUNTER — Other Ambulatory Visit: Payer: Self-pay

## 2019-10-12 VITALS — BP 152/78 | HR 96 | Temp 98.4°F | Ht 63.0 in | Wt 206.0 lb

## 2019-10-12 DIAGNOSIS — B029 Zoster without complications: Secondary | ICD-10-CM

## 2019-10-12 DIAGNOSIS — E039 Hypothyroidism, unspecified: Secondary | ICD-10-CM | POA: Diagnosis not present

## 2019-10-12 DIAGNOSIS — D696 Thrombocytopenia, unspecified: Secondary | ICD-10-CM | POA: Diagnosis not present

## 2019-10-12 DIAGNOSIS — I10 Essential (primary) hypertension: Secondary | ICD-10-CM | POA: Diagnosis not present

## 2019-10-12 DIAGNOSIS — E559 Vitamin D deficiency, unspecified: Secondary | ICD-10-CM

## 2019-10-12 MED ORDER — PREDNISONE 20 MG PO TABS: ORAL_TABLET | ORAL | 0 refills | Status: DC

## 2019-10-12 MED ORDER — OXYCODONE HCL 5 MG PO TABS: 5.0000 mg | ORAL_TABLET | Freq: Two times a day (BID) | ORAL | 0 refills | Status: DC | PRN

## 2019-10-12 NOTE — Assessment & Plan Note (Signed)
Continue current meds labwork pending 

## 2019-10-12 NOTE — Assessment & Plan Note (Signed)
Cbc pending Continue follow up with hematology

## 2019-10-12 NOTE — Assessment & Plan Note (Signed)
rx for prednisone given Finish rx for acyclovir

## 2019-10-12 NOTE — Assessment & Plan Note (Signed)
°  No changes to medicines.  Continue to work on eating a healthy diet and exercise.  Labs drawn today.

## 2019-10-12 NOTE — Progress Notes (Signed)
Established Patient Office Visit  Subjective:  Patient ID: Natalie Perry, female    DOB: [birth year]  Age: 59 y.o. MRN: [id number]  CC:  Chief Complaint  Patient presents with  . Hypertension    Fasting follow up  . Hypothyroidism    HPI Natalie Perry presents for follow up chronic health problems  Pt for follow up of hypertension - she is tolerating medication well - currently on losartan/hctz 100/25 qd - states bp at home 120-130/70s - she denies chest pain or dyspnea  Pt with history of asthma - current on stable medications of symbicort, albuterol and nebulizer - voices no problems or concerns  Pt currently on 192mcg of levothyroxine - due to check TSH  Pt with chronic history of pain in knee and back - she is not candidate for surgery - uses mobic 7.5mg  bid and oxycodone as needed  Pt with history of thrombocytopenia - does follow with hematology regularly  Pt was diagnosed with shingles about a week ago - states she is feeling ok but still healing - she was given gabapentin which she is not going to take but did take acyclovir She was not given prednisone  Past Medical History:  Diagnosis Date  . Arthritis   . Asthma   . Bipolar affective disorder ([place])   . Cirrhosis of liver due to hepatitis C   . Thyroid disease     Past Surgical History:  Procedure Laterality Date  . ABDOMINAL HYSTERECTOMY    . ABDOMINAL SURGERY    . APPENDECTOMY    . CHOLECYSTECTOMY      Family History  Problem Relation Age of Onset  . Asthma Mother   . Bipolar disorder Mother   . CAD Father   . Hypertension Father     Social History   Socioeconomic History  . Marital status: Divorced    Spouse name: Not on file  . Number of children: 2  . Years of education: Not on file  . Highest education level: Not on file  Occupational History  . Occupation: disabled  Tobacco Use  . Smoking status: Never Smoker  . Smokeless tobacco: Never Used  Substance and  Sexual Activity  . Alcohol use: No  . Drug use: Never  . Sexual activity: Not on file  Other Topics Concern  . Not on file  Social History Narrative  . Not on file   Social Determinants of Health   Financial Resource Strain:   . Difficulty of Paying Living Expenses:   Food Insecurity:   . Worried About Charity fundraiser in the Last Year:   . Arboriculturist in the Last Year:   Transportation Needs:   . Film/video editor (Medical):   Marland Kitchen Lack of Transportation (Non-Medical):   Physical Activity:   . Days of Exercise per Week:   . Minutes of Exercise per Session:   Stress:   . Feeling of Stress :   Social Connections:   . Frequency of Communication with Friends and Family:   . Frequency of Social Gatherings with Friends and Family:   . Attends Religious Services:   . Active Member of Clubs or Organizations:   . Attends Archivist Meetings:   Marland Kitchen Marital Status:   Intimate Partner Violence:   . Fear of Current or Ex-Partner:   . Emotionally Abused:   Marland Kitchen Physically Abused:   . Sexually Abused:      Current Outpatient Medications:  .  acyclovir (ZOVIRAX) 800 MG tablet, Take 800 mg by mouth 5 (five) times daily., Disp: , Rfl:  .  albuterol (PROVENTIL) (2.5 MG/3ML) 0.083% nebulizer solution, , Disp: , Rfl:  .  albuterol (VENTOLIN HFA) 108 (90 Base) MCG/ACT inhaler, INHALE 1 TO 2 PUFFS BY MOUTH EVERY 4 TO 6 HOURS AS NEEDED, Disp: 8.5 g, Rfl: 5 .  betamethasone dipropionate (DIPROLENE) 0.05 % ointment, APPLY SMALL AMOUNT EXTERNALLY TO THE AFFECTED AREA DAILY, Disp: , Rfl:  .  budesonide-formoterol (SYMBICORT) 160-4.5 MCG/ACT inhaler, Inhale 2 puffs into the lungs 2 (two) times daily., Disp: , Rfl:  .  busPIRone (BUSPAR) 15 MG tablet, TK 1/2 T PO BID, Disp: , Rfl: 1 .  citalopram (CELEXA) 20 MG tablet, Take by mouth., Disp: , Rfl:  .  gabapentin (NEURONTIN) 300 MG capsule, TAKE 1 CAPSULE BY MOUTH TWICE DAILY X 1 DAY THEN 1 CAPSULE THREE TIMES DAILY, Disp: , Rfl:  .   lactulose (CHRONULAC) 10 GM/15ML solution, , Disp: , Rfl:  .  levothyroxine (SYNTHROID) 100 MCG tablet, TAKE 1 TABLET(100 MCG) BY MOUTH EVERY DAY, Disp: 30 tablet, Rfl: 3 .  loratadine (CLARITIN) 10 MG tablet, Take by mouth., Disp: , Rfl:  .  LORazepam (ATIVAN) 1 MG tablet, TAKE 1 TABLET(1 MG) BY MOUTH THREE TIMES DAILY AS NEEDED FOR ANXIETY, Disp: 90 tablet, Rfl: 0 .  losartan-hydrochlorothiazide (HYZAAR) 100-25 MG per tablet, Take 1 tablet by mouth daily., Disp: , Rfl:  .  metoCLOPramide (REGLAN) 10 MG tablet, Take by mouth., Disp: , Rfl:  .  oxyCODONE (OXY IR/ROXICODONE) 5 MG immediate release tablet, Take 1 tablet (5 mg total) by mouth 2 (two) times daily as needed., Disp: 60 tablet, Rfl: 0 .  potassium chloride SA (KLOR-CON) 20 MEQ tablet, TAKE 2 TABLETS BY MOUTH TWICE DAILY, Disp: 360 tablet, Rfl: 0 .  promethazine (PHENERGAN) 25 MG tablet, Take 1 tablet (25 mg total) by mouth every 8 (eight) hours as needed for nausea or vomiting., Disp: 30 tablet, Rfl: 2 .  Vitamin D, Ergocalciferol, (DRISDOL) 1.25 MG (50000 UNIT) CAPS capsule, Take 1 capsule (50,000 Units total) by mouth every 7 (seven) days., Disp: 12 capsule, Rfl: 1 .  predniSONE (DELTASONE) 20 MG tablet, 1 po tid for 3 days 1 po bid for 3 days 1 po qd for 3 days, Disp: 18 tablet, Rfl: 0   Allergies  Allergen Reactions  . Penicillins Hives, Swelling, Rash and Other (See Comments)    ROS CONSTITUTIONAL: Negative for chills, fatigue, fever, unintentional weight gain and unintentional weight loss.   CARDIOVASCULAR: Negative for chest pain, dizziness, palpitations and pedal edema.  RESPIRATORY: Negative for recent cough and dyspnea.  GASTROINTESTINAL: Negative for abdominal pain, acid reflux symptoms, constipation, diarrhea, nausea and vomiting.  MSK: see HPI INTEGUMENTARY: see HPI NEUROLOGICAL: Negative for dizziness and headaches.  PSYCHIATRIC: Negative for sleep disturbance and to question depression screen.  Negative for  depression, negative for anhedonia.     No visits with results within 1 Day(s) from this visit.  Latest known visit with results is:  Office Visit on 07/02/2019  Component Date Value Ref Range Status  . WBC 07/02/2019 6.4  3.4 - 10.8 x10E3/uL Final  . RBC 07/02/2019 4.16  3.77 - 5.28 x10E6/uL Final  . Hemoglobin 07/02/2019 12.8  11.1 - 15.9 g/dL Final  . Hematocrit 07/02/2019 37.6  34.0 - 46.6 % Final  . MCV 07/02/2019 90  79 - 97 fL Final  . MCH 07/02/2019 30.8  26.6 - 33.0 pg  Final  . MCHC 07/02/2019 34.0  31.5 - 35.7 g/dL Final  . RDW 07/02/2019 12.4  11.7 - 15.4 % Final  . Platelets 07/02/2019 87* 150 - 450 x10E3/uL Final   Comment: Actual platelet count may be somewhat higher than reported due to aggregation of platelets in this sample.   . Neutrophils 07/02/2019 72  Not Estab. % Final  . Lymphs 07/02/2019 16  Not Estab. % Final  . Monocytes 07/02/2019 7  Not Estab. % Final  . Eos 07/02/2019 4  Not Estab. % Final  . Basos 07/02/2019 1  Not Estab. % Final  . Neutrophils Absolute 07/02/2019 4.6  1.4 - 7.0 x10E3/uL Final  . Lymphocytes Absolute 07/02/2019 1.0  0.7 - 3.1 x10E3/uL Final  . Monocytes Absolute 07/02/2019 0.5  0.1 - 0.9 x10E3/uL Final  . EOS (ABSOLUTE) 07/02/2019 0.2  0.0 - 0.4 x10E3/uL Final  . Basophils Absolute 07/02/2019 0.0  0.0 - 0.2 x10E3/uL Final  . Immature Granulocytes 07/02/2019 0  Not Estab. % Final  . Immature Grans (Abs) 07/02/2019 0.0  0.0 - 0.1 x10E3/uL Final  . Hematology Comments: 07/02/2019 Note:   Final   Verified by microscopic examination.  . Glucose 07/02/2019 87  65 - 99 mg/dL Final  . BUN 07/02/2019 13  6 - 24 mg/dL Final  . Creatinine, Ser 07/02/2019 0.73  0.57 - 1.00 mg/dL Final  . GFR calc non Af Amer 07/02/2019 90  >59 mL/min/1.73 Final  . GFR calc Af Amer 07/02/2019 104  >59 mL/min/1.73 Final  . BUN/Creatinine Ratio 07/02/2019 18  9 - 23 Final  . Sodium 07/02/2019 144  134 - 144 mmol/L Final  . Potassium 07/02/2019 4.0  3.5 - 5.2  mmol/L Final  . Chloride 07/02/2019 107* 96 - 106 mmol/L Final  . CO2 07/02/2019 23  20 - 29 mmol/L Final  . Calcium 07/02/2019 9.4  8.7 - 10.2 mg/dL Final  . Total Protein 07/02/2019 6.7  6.0 - 8.5 g/dL Final  . Albumin 07/02/2019 4.3  3.8 - 4.9 g/dL Final  . Globulin, Total 07/02/2019 2.4  1.5 - 4.5 g/dL Final  . Albumin/Globulin Ratio 07/02/2019 1.8  1.2 - 2.2 Final  . Bilirubin Total 07/02/2019 1.2  0.0 - 1.2 mg/dL Final  . Alkaline Phosphatase 07/02/2019 74  39 - 117 IU/L Final  . AST 07/02/2019 37  0 - 40 IU/L Final  . ALT 07/02/2019 26  0 - 32 IU/L Final  . TSH 07/02/2019 2.050  0.450 - 4.500 uIU/mL Final  . Vit D, 25-Hydroxy 07/02/2019 29.4* 30.0 - 100.0 ng/mL Final   Comment: Vitamin D deficiency has been defined by the Institute of Medicine and an Endocrine Society practice guideline as a level of serum 25-OH vitamin D less than 20 ng/mL (1,2). The Endocrine Society went on to further define vitamin D insufficiency as a level between 21 and 29 ng/mL (2). 1. IOM (Institute of Medicine). 2010. Dietary reference    intakes for calcium and D. [place]: The    [employer]. 2. Holick MF, [place]ink[place], Bischoff-Ferrari HA, et al.    Evaluation, treatment, and prevention of vitamin D    deficiency: an Endocrine Society clinical practice    guideline. JCEM. 2011 Jul; 96(7):1911-30.   . Color, UA 07/02/2019 yellow   Final  . Clarity, UA 07/02/2019 clear   Final  . Glucose, UA 07/02/2019 Negative  Negative Final  . Bilirubin, UA 07/02/2019 neg   Final  . Ketones, UA 07/02/2019 neg  Final  . Spec Grav, UA 07/02/2019 1.015  1.010 - 1.025 Final  . Blood, UA 07/02/2019 neg   Final  . pH, UA 07/02/2019 5.0  5.0 - 8.0 Final  . Protein, UA 07/02/2019 Negative  Negative Final  . Urobilinogen, UA 07/02/2019 negative* 0.2 or 1.0 E.U./dL Final  . Nitrite, UA 07/02/2019 neg   Final  . Leukocytes, UA 07/02/2019 Negative  Negative Final  . Appearance 07/02/2019 normal   Final   . Odor 07/02/2019 none   Final      Objective:    PHYSICAL EXAM:   VS: BP (!) 152/78 (BP Location: Left Arm, Patient Position: Sitting)   Pulse 96   Temp 98.4 F (36.9 C) (Temporal)   Ht 5\' 3"  (1.6 m)   Wt 206 lb (93.4 kg)   SpO2 97%   BMI 36.49 kg/m   GEN: Well nourished, well developed, in no acute distress   Cardiac: RRR; no murmurs, rubs, or gallops,no edema - no significant varicosities Respiratory:  normal respiratory rate and pattern with no distress - normal breath sounds with no rales, rhonchi, wheezes or rubs  MS: no deformity or atrophy - walks with cane - slowed Skin: healing herpes zoster on right flank area Neuro:  Alert and Oriented x 3, Strength and sensation are intact - CN II-Xii grossly intact Psych: euthymic mood, appropriate affect and demeanor  BP (!) 152/78 (BP Location: Left Arm, Patient Position: Sitting)   Pulse 96   Temp 98.4 F (36.9 C) (Temporal)   Ht 5\' 3"  (1.6 m)   Wt 206 lb (93.4 kg)   SpO2 97%   BMI 36.49 kg/m  Wt Readings from Last 3 Encounters:  10/12/19 206 lb (93.4 kg)  07/02/19 218 lb (98.9 kg)  12/13/16 219 lb (99.3 kg)     Health Maintenance Due  Topic Date Due  . HIV Screening  Never done  . TETANUS/TDAP  Never done  . PAP SMEAR-Modifier  Never done  . MAMMOGRAM  Never done  . COLONOSCOPY  Never done    There are no preventive care reminders to display for this patient.  Lab Results  Component Value Date   TSH 2.050 07/02/2019   Lab Results  Component Value Date   WBC 6.4 07/02/2019   HGB 12.8 07/02/2019   HCT 37.6 07/02/2019   MCV 90 07/02/2019   PLT 87 (LL) 07/02/2019   Lab Results  Component Value Date   NA 144 07/02/2019   K 4.0 07/02/2019   CO2 23 07/02/2019   GLUCOSE 87 07/02/2019   BUN 13 07/02/2019   CREATININE 0.73 07/02/2019   BILITOT 1.2 07/02/2019   ALKPHOS 74 07/02/2019   AST 37 07/02/2019   ALT 26 07/02/2019   PROT 6.7 07/02/2019   ALBUMIN 4.3 07/02/2019   CALCIUM 9.4 07/02/2019     No results found for: CHOL No results found for: HDL No results found for: LDLCALC No results found for: TRIG No results found for: CHOLHDL No results found for: HGBA1C    Assessment & Plan:   Problem List Items Addressed This Visit      Cardiovascular and Mediastinum   Benign hypertension - Primary     No changes to medicines.  Continue to work on eating a healthy diet and exercise.  Labs drawn today.        Relevant Orders   CBC with Differential/Platelet   Comprehensive metabolic panel   Lipid panel     Endocrine   Hypothyroid  Labs pending  Continue current meds      Relevant Orders   TSH     Other   Thrombocytopenia ([place])    Cbc pending Continue follow up with hematology      Vitamin D deficiency    Continue current meds labwork pending      Relevant Orders   VITAMIN D 25 Hydroxy (Vit-D Deficiency, Fractures)   Herpes zoster without complication    rx for prednisone given Finish rx for acyclovir      Relevant Medications   acyclovir (ZOVIRAX) 800 MG tablet   predniSONE (DELTASONE) 20 MG tablet   oxyCODONE (OXY IR/ROXICODONE) 5 MG immediate release tablet      Meds ordered this encounter  Medications  . predniSONE (DELTASONE) 20 MG tablet    Sig: 1 po tid for 3 days 1 po bid for 3 days 1 po qd for 3 days    Dispense:  18 tablet    Refill:  0    Order Specific Question:   Supervising Provider    AnswerRochel Brome [id number]  . oxyCODONE (OXY IR/ROXICODONE) 5 MG immediate release tablet    Sig: Take 1 tablet (5 mg total) by mouth 2 (two) times daily as needed.    Dispense:  60 tablet    Refill:  0    Order Specific Question:   Supervising Provider    Answer:   Shelton Silvas    Follow-up: Return in about 3 months (around 01/12/2020) for chronic fasting follow up.    SARA R Laureano Hetzer, PA-C

## 2019-10-12 NOTE — Assessment & Plan Note (Signed)
Labs pending  Continue current meds

## 2019-10-13 LAB — CBC WITH DIFFERENTIAL/PLATELET
Basophils Absolute: 0 10*3/uL (ref 0.0–0.2)
Basos: 0 %
EOS (ABSOLUTE): 0.2 10*3/uL (ref 0.0–0.4)
Eos: 3 %
Hematocrit: 35.1 % (ref 34.0–46.6)
Hemoglobin: 12.2 g/dL (ref 11.1–15.9)
Immature Grans (Abs): 0 10*3/uL (ref 0.0–0.1)
Immature Granulocytes: 0 %
Lymphocytes Absolute: 1 10*3/uL (ref 0.7–3.1)
Lymphs: 18 %
MCH: 30.8 pg (ref 26.6–33.0)
MCHC: 34.8 g/dL (ref 31.5–35.7)
MCV: 89 fL (ref 79–97)
Monocytes Absolute: 0.5 10*3/uL (ref 0.1–0.9)
Monocytes: 9 %
Neutrophils Absolute: 3.7 10*3/uL (ref 1.4–7.0)
Neutrophils: 70 %
Platelets: 109 10*3/uL — ABNORMAL LOW (ref 150–450)
RBC: 3.96 x10E6/uL (ref 3.77–5.28)
RDW: 12.8 % (ref 11.7–15.4)
WBC: 5.3 10*3/uL (ref 3.4–10.8)

## 2019-10-13 LAB — COMPREHENSIVE METABOLIC PANEL
ALT: 19 IU/L (ref 0–32)
AST: 27 IU/L (ref 0–40)
Albumin/Globulin Ratio: 1.5 (ref 1.2–2.2)
Albumin: 4 g/dL (ref 3.8–4.9)
Alkaline Phosphatase: 67 IU/L (ref 48–121)
BUN/Creatinine Ratio: 11 (ref 9–23)
BUN: 8 mg/dL (ref 6–24)
Bilirubin Total: 0.8 mg/dL (ref 0.0–1.2)
CO2: 22 mmol/L (ref 20–29)
Calcium: 8.7 mg/dL (ref 8.7–10.2)
Chloride: 104 mmol/L (ref 96–106)
Creatinine, Ser: 0.7 mg/dL (ref 0.57–1.00)
GFR calc Af Amer: 110 mL/min/{1.73_m2} (ref >59)
GFR calc non Af Amer: 95 mL/min/{1.73_m2} (ref >59)
Globulin, Total: 2.6 g/dL (ref 1.5–4.5)
Glucose: 100 mg/dL — ABNORMAL HIGH (ref 65–99)
Potassium: 3.9 mmol/L (ref 3.5–5.2)
Sodium: 139 mmol/L (ref 134–144)
Total Protein: 6.6 g/dL (ref 6.0–8.5)

## 2019-10-13 LAB — LIPID PANEL
Chol/HDL Ratio: 2.9 ratio (ref 0.0–4.4)
Cholesterol, Total: 105 mg/dL (ref 100–199)
HDL: 36 mg/dL — ABNORMAL LOW (ref >39)
LDL Chol Calc (NIH): 57 mg/dL (ref 0–99)
Triglycerides: 47 mg/dL (ref 0–149)
VLDL Cholesterol Cal: 12 mg/dL (ref 5–40)

## 2019-10-13 LAB — TSH: TSH: 3.67 u[IU]/mL (ref 0.450–4.500)

## 2019-10-13 LAB — CARDIOVASCULAR RISK ASSESSMENT

## 2019-10-13 LAB — VITAMIN D 25 HYDROXY (VIT D DEFICIENCY, FRACTURES): Vit D, 25-Hydroxy: 50.2 ng/mL (ref 30.0–100.0)

## 2019-11-28 ENCOUNTER — Other Ambulatory Visit: Payer: Self-pay | Admitting: Physician Assistant

## 2019-12-04 ENCOUNTER — Other Ambulatory Visit: Payer: Self-pay | Admitting: Physician Assistant

## 2019-12-04 DIAGNOSIS — D696 Thrombocytopenia, unspecified: Secondary | ICD-10-CM

## 2019-12-04 DIAGNOSIS — K7460 Unspecified cirrhosis of liver: Secondary | ICD-10-CM

## 2019-12-25 DIAGNOSIS — M1711 Unilateral primary osteoarthritis, right knee: Secondary | ICD-10-CM | POA: Diagnosis not present

## 2020-01-04 ENCOUNTER — Other Ambulatory Visit: Payer: Self-pay | Admitting: Physician Assistant

## 2020-01-16 ENCOUNTER — Encounter: Payer: Self-pay | Admitting: Physician Assistant

## 2020-01-16 ENCOUNTER — Other Ambulatory Visit: Payer: Self-pay

## 2020-01-16 ENCOUNTER — Ambulatory Visit (INDEPENDENT_AMBULATORY_CARE_PROVIDER_SITE_OTHER): Payer: PPO | Admitting: Physician Assistant

## 2020-01-16 VITALS — BP 148/78 | HR 86 | Temp 98.1°F | Ht 63.0 in | Wt 208.0 lb

## 2020-01-16 DIAGNOSIS — B029 Zoster without complications: Secondary | ICD-10-CM

## 2020-01-16 DIAGNOSIS — Z23 Encounter for immunization: Secondary | ICD-10-CM | POA: Diagnosis not present

## 2020-01-16 DIAGNOSIS — Z8619 Personal history of other infectious and parasitic diseases: Secondary | ICD-10-CM | POA: Diagnosis not present

## 2020-01-16 DIAGNOSIS — E039 Hypothyroidism, unspecified: Secondary | ICD-10-CM | POA: Diagnosis not present

## 2020-01-16 DIAGNOSIS — R358 Other polyuria: Secondary | ICD-10-CM

## 2020-01-16 DIAGNOSIS — I10 Essential (primary) hypertension: Secondary | ICD-10-CM

## 2020-01-16 DIAGNOSIS — J4540 Moderate persistent asthma, uncomplicated: Secondary | ICD-10-CM | POA: Diagnosis not present

## 2020-01-16 DIAGNOSIS — R3589 Other polyuria: Secondary | ICD-10-CM | POA: Insufficient documentation

## 2020-01-16 DIAGNOSIS — E559 Vitamin D deficiency, unspecified: Secondary | ICD-10-CM | POA: Diagnosis not present

## 2020-01-16 LAB — POCT URINALYSIS DIPSTICK
Bilirubin, UA: NEGATIVE
Blood, UA: NEGATIVE
Glucose, UA: NEGATIVE
Ketones, UA: NEGATIVE
Leukocytes, UA: NEGATIVE
Nitrite, UA: NEGATIVE
Protein, UA: NEGATIVE
Spec Grav, UA: <=1.005 — AB (ref 1.010–1.025)
Urobilinogen, UA: NEGATIVE E.U./dL — AB
pH, UA: 8 (ref 5.0–8.0)

## 2020-01-16 MED ORDER — OXYCODONE HCL 5 MG PO TABS: 5.0000 mg | ORAL_TABLET | Freq: Two times a day (BID) | ORAL | 0 refills | Status: DC | PRN

## 2020-01-16 MED ORDER — CITALOPRAM HYDROBROMIDE 20 MG PO TABS: 20.0000 mg | ORAL_TABLET | Freq: Every day | ORAL | 1 refills | Status: DC

## 2020-01-16 NOTE — Assessment & Plan Note (Signed)
Continue follow up with specialist as directed

## 2020-01-16 NOTE — Assessment & Plan Note (Signed)
labwork pending Continue current meds

## 2020-01-16 NOTE — Assessment & Plan Note (Signed)
flucelvax given

## 2020-01-16 NOTE — Progress Notes (Signed)
Established Patient Office Visit  Subjective:  Patient ID: Natalie Perry, female    DOB: 08-14-1960  Age: 59 y.o. MRN: [id number]  CC:  Chief Complaint  Patient presents with  . Hypertension    3 month fasting    HPI Natalie Perry presents for follow up chronic health problems  Pt for follow up of hypertension - she is tolerating medication well - currently on losartan/hctz 100/25 qd - states bp at home 120-130/70s - she denies chest pain or dyspnea  Pt with history of asthma - current on stable medications of symbicort, albuterol and nebulizer - voices no problems or concerns  Pt currently on 13mcg of levothyroxine - due to check TSH  Pt with chronic history of pain in knee and back - she is not candidate for surgery - uses mobic 7.5mg  bid and oxycodone as needed- requests refill of pain med today  Pt with history of thrombocytopenia - does follow with hematology regularly  Pt would like to get flu shot today and update also pneumovax Past Medical History:  Diagnosis Date  . Arthritis   . Asthma   . Bipolar affective disorder ([place])   . Cirrhosis of liver due to hepatitis C   . Thyroid disease     Past Surgical History:  Procedure Laterality Date  . ABDOMINAL HYSTERECTOMY    . ABDOMINAL SURGERY    . APPENDECTOMY    . CHOLECYSTECTOMY      Family History  Problem Relation Age of Onset  . Asthma Mother   . Bipolar disorder Mother   . CAD Father   . Hypertension Father     Social History   Socioeconomic History  . Marital status: Divorced    Spouse name: Not on file  . Number of children: 2  . Years of education: Not on file  . Highest education level: Not on file  Occupational History  . Occupation: disabled  Tobacco Use  . Smoking status: Never Smoker  . Smokeless tobacco: Never Used  Vaping Use  . Vaping Use: Never used  Substance and Sexual Activity  . Alcohol use: No  . Drug use: Never  . Sexual activity: Not on file   Other Topics Concern  . Not on file  Social History Narrative  . Not on file   Social Determinants of Health   Financial Resource Strain:   . Difficulty of Paying Living Expenses: Not on file  Food Insecurity:   . Worried About Charity fundraiser in the Last Year: Not on file  . Ran Out of Food in the Last Year: Not on file  Transportation Needs:   . Lack of Transportation (Medical): Not on file  . Lack of Transportation (Non-Medical): Not on file  Physical Activity:   . Days of Exercise per Week: Not on file  . Minutes of Exercise per Session: Not on file  Stress:   . Feeling of Stress : Not on file  Social Connections:   . Frequency of Communication with Friends and Family: Not on file  . Frequency of Social Gatherings with Friends and Family: Not on file  . Attends Religious Services: Not on file  . Active Member of Clubs or Organizations: Not on file  . Attends Archivist Meetings: Not on file  . Marital Status: Not on file  Intimate Partner Violence:   . Fear of Current or Ex-Partner: Not on file  . Emotionally Abused: Not on file  .  Physically Abused: Not on file  . Sexually Abused: Not on file     Current Outpatient Medications:  .  albuterol (PROVENTIL) (2.5 MG/3ML) 0.083% nebulizer solution, INHALE 1 VIAL VIA NEBULIZER 3 TO 4 TIMES A DAY AS DIRECTED, Disp: 300 mL, Rfl: 3 .  albuterol (VENTOLIN HFA) 108 (90 Base) MCG/ACT inhaler, INHALE 1 TO 2 PUFFS BY MOUTH EVERY 4 TO 6 HOURS AS NEEDED, Disp: 8.5 g, Rfl: 5 .  betamethasone dipropionate (DIPROLENE) 0.05 % ointment, APPLY SMALL AMOUNT EXTERNALLY TO THE AFFECTED AREA DAILY, Disp: , Rfl:  .  budesonide-formoterol (SYMBICORT) 160-4.5 MCG/ACT inhaler, Inhale 2 puffs into the lungs 2 (two) times daily., Disp: , Rfl:  .  busPIRone (BUSPAR) 15 MG tablet, TK 1/2 T PO BID, Disp: , Rfl: 1 .  IRON, FERROUS SULFATE, PO, Take by mouth., Disp: , Rfl:  .  lactulose (CHRONULAC) 10 GM/15ML solution, , Disp: , Rfl:  .   levothyroxine (SYNTHROID) 100 MCG tablet, TAKE 1 TABLET(100 MCG) BY MOUTH EVERY DAY, Disp: 30 tablet, Rfl: 3 .  LORazepam (ATIVAN) 1 MG tablet, TAKE 1 TABLET(1 MG) BY MOUTH THREE TIMES DAILY AS NEEDED FOR ANXIETY, Disp: 90 tablet, Rfl: 0 .  losartan-hydrochlorothiazide (HYZAAR) 100-25 MG per tablet, Take 1 tablet by mouth daily., Disp: , Rfl:  .  oxyCODONE (OXY IR/ROXICODONE) 5 MG immediate release tablet, Take 1 tablet (5 mg total) by mouth 2 (two) times daily as needed., Disp: 60 tablet, Rfl: 0 .  potassium chloride SA (KLOR-CON) 20 MEQ tablet, TAKE 2 TABLETS BY MOUTH TWICE DAILY, Disp: 360 tablet, Rfl: 0 .  promethazine (PHENERGAN) 25 MG tablet, Take 1 tablet (25 mg total) by mouth every 8 (eight) hours as needed for nausea or vomiting., Disp: 30 tablet, Rfl: 2 .  Vitamin D, Ergocalciferol, (DRISDOL) 1.25 MG (50000 UNIT) CAPS capsule, Take 1 capsule (50,000 Units total) by mouth every 7 (seven) days., Disp: 12 capsule, Rfl: 1 .  citalopram (CELEXA) 20 MG tablet, Take 1 tablet (20 mg total) by mouth daily., Disp: 90 tablet, Rfl: 1   Allergies  Allergen Reactions  . Penicillins Hives, Swelling, Rash and Other (See Comments)    ROS CONSTITUTIONAL: Negative for chills, fatigue, fever, unintentional weight gain and unintentional weight loss.   CARDIOVASCULAR: Negative for chest pain, dizziness, palpitations and pedal edema.  RESPIRATORY: Negative for recent cough and dyspnea.  GASTROINTESTINAL: Negative for abdominal pain, acid reflux symptoms, constipation, diarrhea, nausea and vomiting.  MSK: see HPI INTEGUMENTARY: negative NEUROLOGICAL: Negative for dizziness and headaches.  PSYCHIATRIC: Negative for sleep disturbance and to question depression screen.  Negative for depression, negative for anhedonia.     Office Visit on 01/16/2020  Component Date Value Ref Range Status  . Glucose, UA 01/16/2020 Negative  Negative Final  . Bilirubin, UA 01/16/2020 Negative   Final  . Ketones, UA  01/16/2020 Negative   Final  . Spec Grav, UA 01/16/2020 <=1.005* 1.010 - 1.025 Final  . Blood, UA 01/16/2020 negative   Final  . pH, UA 01/16/2020 8.0  5.0 - 8.0 Final  . Protein, UA 01/16/2020 Negative  Negative Final  . Urobilinogen, UA 01/16/2020 negative* 0.2 or 1.0 E.U./dL Final  . Nitrite, UA 01/16/2020 negative   Final  . Leukocytes, UA 01/16/2020 Negative  Negative Final      Objective:    PHYSICAL EXAM:   VS: BP (!) 148/78 (BP Location: Left Arm, Patient Position: Sitting)   Pulse 86   Temp 98.1 F (36.7 C) (Temporal)  Ht 5\' 3"  (1.6 m)   Wt 208 lb (94.3 kg)   SpO2 98%   BMI 36.85 kg/m   GEN: Well nourished, well developed, in no acute distress   Cardiac: RRR; no murmurs, rubs, or gallops,no edema - no significant varicosities Respiratory:  normal respiratory rate and pattern with no distress - normal breath sounds with no rales, rhonchi, wheezes or rubs  MS: no deformity or atrophy - walks with cane - slowed  Neuro:  Alert and Oriented x 3, Strength and sensation are intact - CN II-Xii grossly intact Psych: euthymic mood, appropriate affect and demeanor  BP (!) 148/78 (BP Location: Left Arm, Patient Position: Sitting)   Pulse 86   Temp 98.1 F (36.7 C) (Temporal)   Ht 5\' 3"  (1.6 m)   Wt 208 lb (94.3 kg)   SpO2 98%   BMI 36.85 kg/m  Wt Readings from Last 3 Encounters:  01/16/20 208 lb (94.3 kg)  10/12/19 206 lb (93.4 kg)  07/02/19 218 lb (98.9 kg)   Office Visit on 01/16/2020  Component Date Value Ref Range Status  . Glucose, UA 01/16/2020 Negative  Negative Final  . Bilirubin, UA 01/16/2020 Negative   Final  . Ketones, UA 01/16/2020 Negative   Final  . Spec Grav, UA 01/16/2020 <=1.005* 1.010 - 1.025 Final  . Blood, UA 01/16/2020 negative   Final  . pH, UA 01/16/2020 8.0  5.0 - 8.0 Final  . Protein, UA 01/16/2020 Negative  Negative Final  . Urobilinogen, UA 01/16/2020 negative* 0.2 or 1.0 E.U./dL Final  . Nitrite, UA 01/16/2020 negative   Final  .  Leukocytes, UA 01/16/2020 Negative  Negative Final     Health Maintenance Due  Topic Date Due  . INFLUENZA VACCINE  12/09/2019    There are no preventive care reminders to display for this patient.  Lab Results  Component Value Date   TSH 3.670 10/12/2019   Lab Results  Component Value Date   WBC 5.3 10/12/2019   HGB 12.2 10/12/2019   HCT 35.1 10/12/2019   MCV 89 10/12/2019   PLT 109 (L) 10/12/2019   Lab Results  Component Value Date   NA 139 10/12/2019   K 3.9 10/12/2019   CO2 22 10/12/2019   GLUCOSE 100 (H) 10/12/2019   BUN 8 10/12/2019   CREATININE 0.70 10/12/2019   BILITOT 0.8 10/12/2019   ALKPHOS 67 10/12/2019   AST 27 10/12/2019   ALT 19 10/12/2019   PROT 6.6 10/12/2019   ALBUMIN 4.0 10/12/2019   CALCIUM 8.7 10/12/2019   Lab Results  Component Value Date   CHOL 105 10/12/2019   Lab Results  Component Value Date   HDL 36 (L) 10/12/2019   Lab Results  Component Value Date   LDLCALC 57 10/12/2019   Lab Results  Component Value Date   TRIG 47 10/12/2019   Lab Results  Component Value Date   CHOLHDL 2.9 10/12/2019   No results found for: HGBA1C    Assessment & Plan:   Problem List Items Addressed This Visit      Cardiovascular and Mediastinum   Benign hypertension - Primary    Well controlled.  No changes to medicines.  Continue to work on eating a healthy diet and exercise.  Labs drawn today.        Relevant Orders   CBC with Differential/Platelet   Comprehensive metabolic panel   Lipid panel     Respiratory   Moderate persistent asthma without complication  Continue current meds as directed        Digestive   History of hepatitis C    Continue follow up with specialist as directed        Endocrine   Hypothyroid    labwork pending Continue current meds      Relevant Orders   TSH     Other   Vitamin D deficiency    labwork pending Continue current meds      Relevant Orders   VITAMIN D 25 Hydroxy (Vit-D  Deficiency, Fractures)   Herpes zoster without complication   Relevant Medications   oxyCODONE (OXY IR/ROXICODONE) 5 MG immediate release tablet   Polyuria    ua normal - follow up if symptoms persist      Relevant Orders   POCT urinalysis dipstick (Completed)   Need for prophylactic vaccination and inoculation against influenza    flucelvax given      Relevant Orders   Flu Vaccine MDCK QUAD PF   Need for prophylactic vaccination against Streptococcus pneumoniae (pneumococcus)    pneumo 23 given      Relevant Orders   Pneumococcal polysaccharide vaccine 23-valent greater than or equal to 2yo subcutaneous/IM      Meds ordered this encounter  Medications  . citalopram (CELEXA) 20 MG tablet    Sig: Take 1 tablet (20 mg total) by mouth daily.    Dispense:  90 tablet    Refill:  1    Order Specific Question:   Supervising Provider    AnswerRochel Brome [id number]  . oxyCODONE (OXY IR/ROXICODONE) 5 MG immediate release tablet    Sig: Take 1 tablet (5 mg total) by mouth 2 (two) times daily as needed.    Dispense:  60 tablet    Refill:  0    Order Specific Question:   Supervising Provider    Answer:   Shelton Silvas    Follow-up: Return in about 3 months (around 04/16/2020) for chronic fasting follow up.    SARA R Javeon Macmurray, PA-C

## 2020-01-16 NOTE — Assessment & Plan Note (Signed)
ua normal - follow up if symptoms persist

## 2020-01-16 NOTE — Assessment & Plan Note (Signed)
Well controlled.  ?No changes to medicines.  ?Continue to work on eating a healthy diet and exercise.  ?Labs drawn today.  ?

## 2020-01-16 NOTE — Assessment & Plan Note (Signed)
pneumo 23 given

## 2020-01-16 NOTE — Assessment & Plan Note (Signed)
Continue current meds as directed 

## 2020-01-17 LAB — COMPREHENSIVE METABOLIC PANEL WITH GFR
ALT: 24 [IU]/L (ref 0–32)
AST: 35 [IU]/L (ref 0–40)
Albumin/Globulin Ratio: 1.8 (ref 1.2–2.2)
Albumin: 4.1 g/dL (ref 3.8–4.9)
Alkaline Phosphatase: 69 [IU]/L (ref 48–121)
BUN/Creatinine Ratio: 23 (ref 9–23)
BUN: 17 mg/dL (ref 6–24)
Bilirubin Total: 1.1 mg/dL (ref 0.0–1.2)
CO2: 24 mmol/L (ref 20–29)
Calcium: 8.5 mg/dL — ABNORMAL LOW (ref 8.7–10.2)
Chloride: 105 mmol/L (ref 96–106)
Creatinine, Ser: 0.74 mg/dL (ref 0.57–1.00)
GFR calc Af Amer: 103 mL/min/{1.73_m2}
GFR calc non Af Amer: 89 mL/min/{1.73_m2}
Globulin, Total: 2.3 g/dL (ref 1.5–4.5)
Glucose: 82 mg/dL (ref 65–99)
Potassium: 3.6 mmol/L (ref 3.5–5.2)
Sodium: 142 mmol/L (ref 134–144)
Total Protein: 6.4 g/dL (ref 6.0–8.5)

## 2020-01-17 LAB — CBC WITH DIFFERENTIAL/PLATELET
Basophils Absolute: 0 10*3/uL (ref 0.0–0.2)
Basos: 0 %
EOS (ABSOLUTE): 0.1 10*3/uL (ref 0.0–0.4)
Eos: 2 %
Hematocrit: 37.5 % (ref 34.0–46.6)
Hemoglobin: 12.8 g/dL (ref 11.1–15.9)
Immature Grans (Abs): 0 10*3/uL (ref 0.0–0.1)
Immature Granulocytes: 0 %
Lymphocytes Absolute: 0.7 10*3/uL (ref 0.7–3.1)
Lymphs: 11 %
MCH: 31.1 pg (ref 26.6–33.0)
MCHC: 34.1 g/dL (ref 31.5–35.7)
MCV: 91 fL (ref 79–97)
Monocytes Absolute: 0.5 10*3/uL (ref 0.1–0.9)
Monocytes: 8 %
Neutrophils Absolute: 5.4 10*3/uL (ref 1.4–7.0)
Neutrophils: 79 %
Platelets: 78 10*3/uL — CL (ref 150–450)
RBC: 4.12 x10E6/uL (ref 3.77–5.28)
RDW: 12.9 % (ref 11.7–15.4)
WBC: 6.9 10*3/uL (ref 3.4–10.8)

## 2020-01-17 LAB — LIPID PANEL
Chol/HDL Ratio: 2.6 ratio (ref 0.0–4.4)
Cholesterol, Total: 115 mg/dL (ref 100–199)
HDL: 45 mg/dL
LDL Chol Calc (NIH): 59 mg/dL (ref 0–99)
Triglycerides: 46 mg/dL (ref 0–149)
VLDL Cholesterol Cal: 11 mg/dL (ref 5–40)

## 2020-01-17 LAB — CARDIOVASCULAR RISK ASSESSMENT

## 2020-01-17 LAB — TSH: TSH: 4.32 u[IU]/mL (ref 0.450–4.500)

## 2020-01-17 LAB — VITAMIN D 25 HYDROXY (VIT D DEFICIENCY, FRACTURES): Vit D, 25-Hydroxy: 44.9 ng/mL (ref 30.0–100.0)

## 2020-02-08 ENCOUNTER — Other Ambulatory Visit: Payer: Self-pay | Admitting: Physician Assistant

## 2020-02-11 ENCOUNTER — Other Ambulatory Visit: Payer: Self-pay | Admitting: Physician Assistant

## 2020-02-22 DIAGNOSIS — Z20828 Contact with and (suspected) exposure to other viral communicable diseases: Secondary | ICD-10-CM | POA: Diagnosis not present

## 2020-02-22 DIAGNOSIS — J069 Acute upper respiratory infection, unspecified: Secondary | ICD-10-CM | POA: Diagnosis not present

## 2020-03-09 ENCOUNTER — Other Ambulatory Visit: Payer: Self-pay | Admitting: Physician Assistant

## 2020-03-09 DIAGNOSIS — D696 Thrombocytopenia, unspecified: Secondary | ICD-10-CM

## 2020-03-09 DIAGNOSIS — K7460 Unspecified cirrhosis of liver: Secondary | ICD-10-CM

## 2020-03-19 ENCOUNTER — Telehealth: Payer: Self-pay | Admitting: Physician Assistant

## 2020-03-19 NOTE — Chronic Care Management (AMB) (Signed)
  Chronic Care Management   Note  03/19/2020 Name: Airianna Kreischer MRN: [id number] DOB: 06-27-1960  Avelina Laine Newsom Ionescu is a 59 y.o. year old female who is a primary care patient of [name], Hershal Coria. I reached out to [place] by phone today in response to a referral sent by Ms. Avelina Laine Newsom Ponds's PCP, [name], PA-C.   Ms. Lanes was given information about Chronic Care Management services today including:  1. CCM service includes personalized support from designated clinical staff supervised by her physician, including individualized plan of care and coordination with other care providers 2. 24/7 contact phone numbers for assistance for urgent and routine care needs. 3. Service will only be billed when office clinical staff spend 20 minutes or more in a month to coordinate care. 4. Only one practitioner may furnish and bill the service in a calendar month. 5. The patient may stop CCM services at any time (effective at the end of the month) by phone call to the office staff.   Patient agreed to services and verbal consent obtained.   Follow up plan:   [place]

## 2020-03-19 NOTE — Progress Notes (Signed)
  Chronic Care Management   Outreach Note  03/19/2020 Name: Natalie Perry MRN: [id number] DOB: Aug 19, 1960  Referred by: [name], PA-C Reason for referral : Chronic Care Management   An unsuccessful telephone outreach was attempted today. The patient was referred to the pharmacist for assistance with care management and care coordination.   Follow Up Plan:   Hilario Quarry  Upstream Scheduler

## 2020-03-28 ENCOUNTER — Other Ambulatory Visit: Payer: Self-pay | Admitting: Hematology and Oncology

## 2020-03-28 DIAGNOSIS — D696 Thrombocytopenia, unspecified: Secondary | ICD-10-CM

## 2020-03-30 ENCOUNTER — Other Ambulatory Visit: Payer: Self-pay | Admitting: Oncology

## 2020-03-30 DIAGNOSIS — D696 Thrombocytopenia, unspecified: Secondary | ICD-10-CM

## 2020-03-31 ENCOUNTER — Inpatient Hospital Stay: Payer: PPO | Admitting: Oncology

## 2020-03-31 ENCOUNTER — Inpatient Hospital Stay: Payer: PPO

## 2020-03-31 ENCOUNTER — Telehealth: Payer: Self-pay | Admitting: Oncology

## 2020-03-31 NOTE — Telephone Encounter (Signed)
Patient's brother canceled appt today due to patient being sick.  Patient will call back to reschedule

## 2020-04-03 ENCOUNTER — Other Ambulatory Visit: Payer: Self-pay | Admitting: Physician Assistant

## 2020-04-03 DIAGNOSIS — D696 Thrombocytopenia, unspecified: Secondary | ICD-10-CM

## 2020-04-03 DIAGNOSIS — K7460 Unspecified cirrhosis of liver: Secondary | ICD-10-CM

## 2020-04-23 ENCOUNTER — Ambulatory Visit (INDEPENDENT_AMBULATORY_CARE_PROVIDER_SITE_OTHER): Payer: PPO | Admitting: Physician Assistant

## 2020-04-23 ENCOUNTER — Encounter: Payer: Self-pay | Admitting: Physician Assistant

## 2020-04-23 ENCOUNTER — Other Ambulatory Visit: Payer: Self-pay

## 2020-04-23 VITALS — BP 132/80 | HR 80 | Temp 97.3°F | Ht 63.0 in | Wt 208.0 lb

## 2020-04-23 DIAGNOSIS — Z1231 Encounter for screening mammogram for malignant neoplasm of breast: Secondary | ICD-10-CM | POA: Diagnosis not present

## 2020-04-23 DIAGNOSIS — B029 Zoster without complications: Secondary | ICD-10-CM | POA: Diagnosis not present

## 2020-04-23 DIAGNOSIS — M25561 Pain in right knee: Secondary | ICD-10-CM | POA: Diagnosis not present

## 2020-04-23 DIAGNOSIS — D696 Thrombocytopenia, unspecified: Secondary | ICD-10-CM

## 2020-04-23 DIAGNOSIS — J4540 Moderate persistent asthma, uncomplicated: Secondary | ICD-10-CM

## 2020-04-23 DIAGNOSIS — G8929 Other chronic pain: Secondary | ICD-10-CM | POA: Diagnosis not present

## 2020-04-23 DIAGNOSIS — I10 Essential (primary) hypertension: Secondary | ICD-10-CM | POA: Diagnosis not present

## 2020-04-23 DIAGNOSIS — M25562 Pain in left knee: Secondary | ICD-10-CM

## 2020-04-23 MED ORDER — PREDNISONE 20 MG PO TABS: ORAL_TABLET | ORAL | 0 refills | Status: AC

## 2020-04-23 MED ORDER — OXYCODONE HCL 5 MG PO TABS: 5.0000 mg | ORAL_TABLET | Freq: Two times a day (BID) | ORAL | 0 refills | Status: DC | PRN

## 2020-04-23 MED ORDER — BUDESONIDE-FORMOTEROL FUMARATE 160-4.5 MCG/ACT IN AERO: 2.0000 | INHALATION_SPRAY | Freq: Two times a day (BID) | RESPIRATORY_TRACT | 5 refills | Status: DC

## 2020-04-23 NOTE — Progress Notes (Signed)
Established Patient Office Visit  Subjective:  Patient ID: Natalie Perry, female    DOB: April 17, 1961  Age: 59 y.o. MRN: [id number]  CC:  Chief Complaint  Patient presents with  . Hypertension    Follow up    HPI Natalie Perry presents for follow up chronic health problems  Pt for follow up of hypertension - she is tolerating medication well - currently on losartan/hctz 100/25 qd -   Pt with history of asthma - current on stable medications of symbicort, albuterol and nebulizer - has been having some mild wheezing but no cough, congestion or fever  Pt with chronic history of pain in knee and back - she is not candidate for surgery - uses mobic 7.67m bid and oxycodone as needed- requests refill of pain med today  Pt with history of thrombocytopenia - does follow with hematology regularly   Past Medical History:  Diagnosis Date  . Arthritis   . Asthma   . Bipolar affective disorder ([place]   . Cirrhosis of liver due to hepatitis C   . Thyroid disease     Past Surgical History:  Procedure Laterality Date  . ABDOMINAL HYSTERECTOMY    . ABDOMINAL SURGERY    . APPENDECTOMY    . CHOLECYSTECTOMY      Family History  Problem Relation Age of Onset  . Asthma Mother   . Bipolar disorder Mother   . CAD Father   . Hypertension Father     Social History   Socioeconomic History  . Marital status: Divorced    Spouse name: Not on file  . Number of children: 2  . Years of education: Not on file  . Highest education level: Not on file  Occupational History  . Occupation: disabled  Tobacco Use  . Smoking status: Never Smoker  . Smokeless tobacco: Never Used  Vaping Use  . Vaping Use: Never used  Substance and Sexual Activity  . Alcohol use: No  . Drug use: Never  . Sexual activity: Not on file  Other Topics Concern  . Not on file  Social History Narrative  . Not on file   Social Determinants of Health   Financial Resource Strain: Not on  file  Food Insecurity: Not on file  Transportation Needs: Not on file  Physical Activity: Not on file  Stress: Not on file  Social Connections: Not on file  Intimate Partner Violence: Not on file     Current Outpatient Medications:  .  albuterol (PROVENTIL) (2.5 MG/3ML) 0.083% nebulizer solution, INHALE 1 VIAL VIA NEBULIZER 3 TO 4 TIMES A DAY AS DIRECTED, Disp: 300 mL, Rfl: 3 .  albuterol (VENTOLIN HFA) 108 (90 Base) MCG/ACT inhaler, INHALE 1 TO 2 PUFFS BY MOUTH EVERY 4 TO 6 HOURS AS NEEDED, Disp: 8.5 g, Rfl: 5 .  betamethasone dipropionate (DIPROLENE) 0.05 % ointment, APPLY SMALL AMOUNT EXTERNALLY TO THE AFFECTED AREA DAILY, Disp: , Rfl:  .  busPIRone (BUSPAR) 15 MG tablet, TK 1/2 T PO BID, Disp: , Rfl: 1 .  citalopram (CELEXA) 20 MG tablet, Take 1 tablet (20 mg total) by mouth daily., Disp: 90 tablet, Rfl: 1 .  IRON, FERROUS SULFATE, PO, Take by mouth., Disp: , Rfl:  .  lactulose (CHRONULAC) 10 GM/15ML solution, , Disp: , Rfl:  .  levothyroxine (SYNTHROID) 100 MCG tablet, TAKE 1 TABLET(100 MCG) BY MOUTH EVERY DAY, Disp: 30 tablet, Rfl: 3 .  LORazepam (ATIVAN) 1 MG tablet, TAKE 1 TABLET(1 MG) BY  MOUTH THREE TIMES DAILY AS NEEDED FOR ANXIETY, Disp: 90 tablet, Rfl: 1 .  losartan-hydrochlorothiazide (HYZAAR) 100-25 MG tablet, TAKE 1 TABLET BY MOUTH DAILY, Disp: 90 tablet, Rfl: 0 .  meloxicam (MOBIC) 7.5 MG tablet, Take 7.5 mg by mouth 2 (two) times daily., Disp: , Rfl:  .  potassium chloride SA (KLOR-CON) 20 MEQ tablet, TAKE 2 TABLETS BY MOUTH TWICE DAILY, Disp: 360 tablet, Rfl: 0 .  promethazine (PHENERGAN) 25 MG tablet, Take 1 tablet (25 mg total) by mouth every 8 (eight) hours as needed for nausea or vomiting., Disp: 30 tablet, Rfl: 2 .  Vitamin D, Ergocalciferol, (DRISDOL) 1.25 MG (50000 UNIT) CAPS capsule, Take 1 capsule (50,000 Units total) by mouth every 7 (seven) days., Disp: 12 capsule, Rfl: 1 .  budesonide-formoterol (SYMBICORT) 160-4.5 MCG/ACT inhaler, Inhale 2 puffs into the lungs  2 (two) times daily., Disp: 1 each, Rfl: 5 .  oxyCODONE (OXY IR/ROXICODONE) 5 MG immediate release tablet, Take 1 tablet (5 mg total) by mouth 2 (two) times daily as needed., Disp: 60 tablet, Rfl: 0 .  predniSONE (DELTASONE) 20 MG tablet, Take 3 tablets (60 mg total) by mouth daily with breakfast for 3 days, THEN 2 tablets (40 mg total) daily with breakfast for 3 days, THEN 1 tablet (20 mg total) daily with breakfast for 3 days., Disp: 18 tablet, Rfl: 0   Allergies  Allergen Reactions  . Penicillins Hives, Swelling, Rash and Other (See Comments)    ROS CONSTITUTIONAL: Negative for chills, fatigue, fever, unintentional weight gain and unintentional weight loss.   CARDIOVASCULAR: Negative for chest pain, dizziness, palpitations and pedal edema.  RESPIRATORY: see HPI GASTROINTESTINAL: Negative for abdominal pain, acid reflux symptoms, constipation, diarrhea, nausea and vomiting.  MSK: see HPI INTEGUMENTARY: negative NEUROLOGICAL: Negative for dizziness and headaches.  PSYCHIATRIC: Negative for sleep disturbance and to question depression screen.  Negative for depression, negative for anhedonia.     No visits with results within 1 Day(s) from this visit.  Latest known visit with results is:  Office Visit on 01/16/2020  Component Date Value Ref Range Status  . Glucose, UA 01/16/2020 Negative  Negative Final  . Bilirubin, UA 01/16/2020 Negative   Final  . Ketones, UA 01/16/2020 Negative   Final  . Spec Grav, UA 01/16/2020 <=1.005* 1.010 - 1.025 Final  . Blood, UA 01/16/2020 negative   Final  . pH, UA 01/16/2020 8.0  5.0 - 8.0 Final  . Protein, UA 01/16/2020 Negative  Negative Final  . Urobilinogen, UA 01/16/2020 negative* 0.2 or 1.0 E.U./dL Final  . Nitrite, UA 01/16/2020 negative   Final  . Leukocytes, UA 01/16/2020 Negative  Negative Final  . WBC 01/16/2020 6.9  3.4 - 10.8 x10E3/uL Final  . RBC 01/16/2020 4.12  3.77 - 5.28 x10E6/uL Final  . Hemoglobin 01/16/2020 12.8  11.1 - 15.9  g/dL Final  . Hematocrit 01/16/2020 37.5  34.0 - 46.6 % Final  . MCV 01/16/2020 91  79 - 97 fL Final  . MCH 01/16/2020 31.1  26.6 - 33.0 pg Final  . MCHC 01/16/2020 34.1  31.5 - 35.7 g/dL Final  . RDW 01/16/2020 12.9  11.7 - 15.4 % Final  . Platelets 01/16/2020 78* 150 - 450 x10E3/uL Final   Comment: Actual platelet count may be somewhat higher than reported due to aggregation of platelets in this sample.   . Neutrophils 01/16/2020 79  Not Estab. % Final  . Lymphs 01/16/2020 11  Not Estab. % Final  . Monocytes 01/16/2020 8  Not Estab. % Final  . Eos 01/16/2020 2  Not Estab. % Final  . Basos 01/16/2020 0  Not Estab. % Final  . Neutrophils Absolute 01/16/2020 5.4  1.4 - 7.0 x10E3/uL Final  . Lymphocytes Absolute 01/16/2020 0.7  0.7 - 3.1 x10E3/uL Final  . Monocytes Absolute 01/16/2020 0.5  0.1 - 0.9 x10E3/uL Final  . EOS (ABSOLUTE) 01/16/2020 0.1  0.0 - 0.4 x10E3/uL Final  . Basophils Absolute 01/16/2020 0.0  0.0 - 0.2 x10E3/uL Final  . Immature Granulocytes 01/16/2020 0  Not Estab. % Final  . Immature Grans (Abs) 01/16/2020 0.0  0.0 - 0.1 x10E3/uL Final  . Hematology Comments: 01/16/2020 Note:   Final   Verified by microscopic examination.  . Glucose 01/16/2020 82  65 - 99 mg/dL Final  . BUN 01/16/2020 17  6 - 24 mg/dL Final  . Creatinine, Ser 01/16/2020 0.74  0.57 - 1.00 mg/dL Final  . GFR calc non Af Amer 01/16/2020 89  >59 mL/min/1.73 Final  . GFR calc Af Amer 01/16/2020 103  >59 mL/min/1.73 Final   Comment: **[facility] currently reports eGFR in compliance with the current**   recommendations of the [employer]. [facility] will   update reporting as new guidelines are published from the NKF-ASN   Task force.   . BUN/Creatinine Ratio 01/16/2020 23  9 - 23 Final  . Sodium 01/16/2020 142  134 - 144 mmol/L Final  . Potassium 01/16/2020 3.6  3.5 - 5.2 mmol/L Final  . Chloride 01/16/2020 105  96 - 106 mmol/L Final  . CO2 01/16/2020 24  20 - 29 mmol/L Final  . Calcium  01/16/2020 8.5* 8.7 - 10.2 mg/dL Final  . Total Protein 01/16/2020 6.4  6.0 - 8.5 g/dL Final  . Albumin 01/16/2020 4.1  3.8 - 4.9 g/dL Final  . Globulin, Total 01/16/2020 2.3  1.5 - 4.5 g/dL Final  . Albumin/Globulin Ratio 01/16/2020 1.8  1.2 - 2.2 Final  . Bilirubin Total 01/16/2020 1.1  0.0 - 1.2 mg/dL Final  . Alkaline Phosphatase 01/16/2020 69  48 - 121 IU/L Final   Comment: **Effective January 21, 2020 Alkaline Phosphatase**   reference interval will be changing to:              Age                Female          Female           0 -  5 days         16 - 127       58 - 127           6 - 10 days         51 - 242       42 - 19          11 - 20 days        109 - 357      109 - 357          21 - 30 days         94 - 935       70 - 494           1 -  2 months      149 - 539      149 - 539           3 -  6 months      131 -  452      131 - 452           7 - 11 months      117 - 401      117 - 401   12 months -  6 years       158 - 369      158 - 369           7 - 12 years       150 - 409      150 - 409               13 years       156 - 435       46 - 227               14 years       114 - 375       82 - 161               15 years        79 - 279       79 - 134               16 years        3 - 207       41 - 121               17 years        85 - 161       16 - 113          18 - 20 years        57 - 125       83 - 106              >20 years                                  46 - 121       72 - 121   . AST 01/16/2020 35  0 - 40 IU/L Final  . ALT 01/16/2020 24  0 - 32 IU/L Final  . TSH 01/16/2020 4.320  0.450 - 4.500 uIU/mL Final  . Cholesterol, Total 01/16/2020 115  100 - 199 mg/dL Final  . Triglycerides 01/16/2020 46  0 - 149 mg/dL Final  . HDL 01/16/2020 45  >39 mg/dL Final  . VLDL Cholesterol Cal 01/16/2020 11  5 - 40 mg/dL Final  . LDL Chol Calc (NIH) 01/16/2020 59  0 - 99 mg/dL Final  . Chol/HDL Ratio 01/16/2020 2.6  0.0 - 4.4 ratio Final   Comment:                                    T. Chol/HDL Ratio                                             Men  Women                               1/2 Avg.Risk  3.4    3.3  Avg.Risk  5.0    4.4                                2X Avg.Risk  9.6    7.1                                3X Avg.Risk 23.4   11.0   . Vit D, 25-Hydroxy 01/16/2020 44.9  30.0 - 100.0 ng/mL Final   Comment: Vitamin D deficiency has been defined by the [place] practice guideline as a level of serum 25-OH vitamin D less than 20 ng/mL (1,2). The Endocrine Society went on to further define vitamin D insufficiency as a level between 21 and 29 ng/mL (2). 1. IOM (Institute of Medicine). 2010. Dietary reference    intakes for calcium and D. [place]: The    [employer]. 2. Holick MF, Binkley [place], Bischoff-Ferrari HA, et al.    Evaluation, treatment, and prevention of vitamin D    deficiency: an Endocrine Society clinical practice    guideline. JCEM. 2011 Jul; 96(7):1911-30.   Marland Kitchen Interpretation 01/16/2020 Note   Final   Supplemental report is available.      Objective:    PHYSICAL EXAM:   VS: BP 132/80 (BP Location: Left Arm, Patient Position: Sitting, Cuff Size: Large)   Pulse 80   Temp (!) 97.3 F (36.3 C) (Temporal)   Ht _0  (1.6 m)   Wt 208 lb (94.3 kg)   SpO2 99%   BMI 36.85 kg/m   GEN: Well nourished, well developed, in no acute distress   Cardiac: RRR; no murmurs, rubs, or gallops,no edema - no significant varicosities Respiratory:  normal respiratory rate and pattern with no distress - normal breath sounds with rare exp wheezes noted  MS: no deformity or atrophy - walks with cane - slowed  Neuro:  Alert and Oriented x 3, Strength and sensation are intact - CN II-Xii grossly intact Psych: euthymic mood, appropriate affect and demeanor  BP 132/80 (BP Location: Left Arm, Patient Position: Sitting, Cuff Size: Large)   Pulse 80   Temp (!) 97.3 F  (36.3 C) (Temporal)   Ht _1  (1.6 m)   Wt 208 lb (94.3 kg)   SpO2 99%   BMI 36.85 kg/m  Wt Readings from Last 3 Encounters:  04/23/20 208 lb (94.3 kg)  01/16/20 208 lb (94.3 kg)  10/12/19 206 lb (93.4 kg)   No visits with results within 1 Day(s) from this visit.  Latest known visit with results is:  Office Visit on 01/16/2020  Component Date Value Ref Range Status  . Glucose, UA 01/16/2020 Negative  Negative Final  . Bilirubin, UA 01/16/2020 Negative   Final  . Ketones, UA 01/16/2020 Negative   Final  . Spec Grav, UA 01/16/2020 <=1.005* 1.010 - 1.025 Final  . Blood, UA 01/16/2020 negative   Final  . pH, UA 01/16/2020 8.0  5.0 - 8.0 Final  . Protein, UA 01/16/2020 Negative  Negative Final  . Urobilinogen, UA 01/16/2020 negative* 0.2 or 1.0 E.U./dL Final  . Nitrite, UA 01/16/2020 negative   Final  . Leukocytes, UA 01/16/2020 Negative  Negative Final  . WBC 01/16/2020 6.9  3.4 - 10.8 x10E3/uL Final  . RBC 01/16/2020 4.12  3.77 - 5.28 x10E6/uL Final  . Hemoglobin 01/16/2020 12.8  11.1 -  15.9 g/dL Final  . Hematocrit 01/16/2020 37.5  34.0 - 46.6 % Final  . MCV 01/16/2020 91  79 - 97 fL Final  . MCH 01/16/2020 31.1  26.6 - 33.0 pg Final  . MCHC 01/16/2020 34.1  31.5 - 35.7 g/dL Final  . RDW 01/16/2020 12.9  11.7 - 15.4 % Final  . Platelets 01/16/2020 78* 150 - 450 x10E3/uL Final   Comment: Actual platelet count may be somewhat higher than reported due to aggregation of platelets in this sample.   . Neutrophils 01/16/2020 79  Not Estab. % Final  . Lymphs 01/16/2020 11  Not Estab. % Final  . Monocytes 01/16/2020 8  Not Estab. % Final  . Eos 01/16/2020 2  Not Estab. % Final  . Basos 01/16/2020 0  Not Estab. % Final  . Neutrophils Absolute 01/16/2020 5.4  1.4 - 7.0 x10E3/uL Final  . Lymphocytes Absolute 01/16/2020 0.7  0.7 - 3.1 x10E3/uL Final  . Monocytes Absolute 01/16/2020 0.5  0.1 - 0.9 x10E3/uL Final  . EOS (ABSOLUTE) 01/16/2020 0.1  0.0 - 0.4 x10E3/uL Final  . Basophils  Absolute 01/16/2020 0.0  0.0 - 0.2 x10E3/uL Final  . Immature Granulocytes 01/16/2020 0  Not Estab. % Final  . Immature Grans (Abs) 01/16/2020 0.0  0.0 - 0.1 x10E3/uL Final  . Hematology Comments: 01/16/2020 Note:   Final   Verified by microscopic examination.  . Glucose 01/16/2020 82  65 - 99 mg/dL Final  . BUN 01/16/2020 17  6 - 24 mg/dL Final  . Creatinine, Ser 01/16/2020 0.74  0.57 - 1.00 mg/dL Final  . GFR calc non Af Amer 01/16/2020 89  >59 mL/min/1.73 Final  . GFR calc Af Amer 01/16/2020 103  >59 mL/min/1.73 Final   Comment: **[facility] currently reports eGFR in compliance with the current**   recommendations of the [employer]. [facility] will   update reporting as new guidelines are published from the NKF-ASN   Task force.   . BUN/Creatinine Ratio 01/16/2020 23  9 - 23 Final  . Sodium 01/16/2020 142  134 - 144 mmol/L Final  . Potassium 01/16/2020 3.6  3.5 - 5.2 mmol/L Final  . Chloride 01/16/2020 105  96 - 106 mmol/L Final  . CO2 01/16/2020 24  20 - 29 mmol/L Final  . Calcium 01/16/2020 8.5* 8.7 - 10.2 mg/dL Final  . Total Protein 01/16/2020 6.4  6.0 - 8.5 g/dL Final  . Albumin 01/16/2020 4.1  3.8 - 4.9 g/dL Final  . Globulin, Total 01/16/2020 2.3  1.5 - 4.5 g/dL Final  . Albumin/Globulin Ratio 01/16/2020 1.8  1.2 - 2.2 Final  . Bilirubin Total 01/16/2020 1.1  0.0 - 1.2 mg/dL Final  . Alkaline Phosphatase 01/16/2020 69  48 - 121 IU/L Final   Comment: **Effective January 21, 2020 Alkaline Phosphatase**   reference interval will be changing to:              Age                Female          Female           0 -  5 days         82 - 127       46 - 127           6 - 10 days         52 - 800       34 - 917  11 - 20 days        109 - 357      109 - 357          21 - 30 days         94 - 494       94 - 494           1 -  2 months      149 - 539      149 - 539           3 -  6 months      131 - 452      131 - 452           7 - 11 months      117 - 401      117  - 401   12 months -  6 years       158 - 369      158 - 369           7 - 12 years       150 - 409      150 - 409               13 years       156 - 435       78 - 227               14 years       114 - 375       64 - 161               15 years        88 - 279       56 - 134               16 years        74 - 207       51 - 121               17 years        66 - 161       50 - 113          18 - 20 years        51 - 125       42 - 106              >20 years                                  20 - 121       52 - 121   . AST 01/16/2020 35  0 - 40 IU/L Final  . ALT 01/16/2020 24  0 - 32 IU/L Final  . TSH 01/16/2020 4.320  0.450 - 4.500 uIU/mL Final  . Cholesterol, Total 01/16/2020 115  100 - 199 mg/dL Final  . Triglycerides 01/16/2020 46  0 - 149 mg/dL Final  . HDL 01/16/2020 45  >39 mg/dL Final  . VLDL Cholesterol Cal 01/16/2020 11  5 - 40 mg/dL Final  . LDL Chol Calc (NIH) 01/16/2020 59  0 - 99 mg/dL Final  . Chol/HDL Ratio 01/16/2020 2.6  0.0 - 4.4 ratio Final   Comment:  T. Chol/HDL Ratio                                             Men  Women                               1/2 Avg.Risk  3.4    3.3                                   Avg.Risk  5.0    4.4                                2X Avg.Risk  9.6    7.1                                3X Avg.Risk 23.4   11.0   . Vit D, 25-Hydroxy 01/16/2020 44.9  30.0 - 100.0 ng/mL Final   Comment: Vitamin D deficiency has been defined by the [place] practice guideline as a level of serum 25-OH vitamin D less than 20 ng/mL (1,2). The Endocrine Society went on to further define vitamin D insufficiency as a level between 21 and 29 ng/mL (2). 1. IOM (Institute of Medicine). 2010. Dietary reference    intakes for calcium and D. [place]: The    [employer]. 2. Holick MF, Binkley [place], Bischoff-Ferrari HA, et al.    Evaluation, treatment, and prevention of vitamin  D    deficiency: an Endocrine Society clinical practice    guideline. JCEM. 2011 Jul; 96(7):1911-30.   Marland Kitchen Interpretation 01/16/2020 Note   Final   Supplemental report is available.     Health Maintenance Due  Topic Date Due  . COVID-19 Vaccine (3 - Booster for Pfizer series) 03/01/2020  . MAMMOGRAM  03/24/2020    There are no preventive care reminders to display for this patient.  Lab Results  Component Value Date   TSH 4.320 01/16/2020   Lab Results  Component Value Date   WBC 6.9 01/16/2020   HGB 12.8 01/16/2020   HCT 37.5 01/16/2020   MCV 91 01/16/2020   PLT 78 (LL) 01/16/2020   Lab Results  Component Value Date   NA 142 01/16/2020   K 3.6 01/16/2020   CO2 24 01/16/2020   GLUCOSE 82 01/16/2020   BUN 17 01/16/2020   CREATININE 0.74 01/16/2020   BILITOT 1.1 01/16/2020   ALKPHOS 69 01/16/2020   AST 35 01/16/2020   ALT 24 01/16/2020   PROT 6.4 01/16/2020   ALBUMIN 4.1 01/16/2020   CALCIUM 8.5 (L) 01/16/2020   Lab Results  Component Value Date   CHOL 115 01/16/2020   Lab Results  Component Value Date   HDL 45 01/16/2020   Lab Results  Component Value Date   LDLCALC 59 01/16/2020   Lab Results  Component Value Date   TRIG 46 01/16/2020   Lab Results  Component Value Date   CHOLHDL 2.6 01/16/2020   No results found for: HGBA1C    Assessment & Plan:   Problem List Items Addressed This Visit  Cardiovascular and Mediastinum   Benign hypertension - Primary   Relevant Orders   CBC with Differential/Platelet   Comprehensive metabolic panel Continue current meds        Meds ordered this encounter  Medications  . predniSONE (DELTASONE) 20 MG tablet    Sig: Take 3 tablets (60 mg total) by mouth daily with breakfast for 3 days, THEN 2 tablets (40 mg total) daily with breakfast for 3 days, THEN 1 tablet (20 mg total) daily with breakfast for 3 days.    Dispense:  18 tablet    Refill:  0    Order Specific Question:   Supervising Provider     AnswerRochel Brome [id number]  . budesonide-formoterol (SYMBICORT) 160-4.5 MCG/ACT inhaler    Sig: Inhale 2 puffs into the lungs 2 (two) times daily.    Dispense:  1 each    Refill:  5    Order Specific Question:   Supervising Provider    AnswerRochel Brome [id number]  . oxyCODONE (OXY IR/ROXICODONE) 5 MG immediate release tablet    Sig: Take 1 tablet (5 mg total) by mouth 2 (two) times daily as needed.    Dispense:  60 tablet    Refill:  0    Order Specific Question:   Supervising Provider    Answer:   Shelton Silvas    Follow-up: Return in about 3 months (around 07/22/2020) for chronic fasting follow up.    SARA R Santanna Olenik, PA-C

## 2020-04-24 ENCOUNTER — Other Ambulatory Visit: Payer: Self-pay | Admitting: Physician Assistant

## 2020-04-24 DIAGNOSIS — R899 Unspecified abnormal finding in specimens from other organs, systems and tissues: Secondary | ICD-10-CM

## 2020-04-24 LAB — COMPREHENSIVE METABOLIC PANEL
ALT: 23 IU/L (ref 0–32)
AST: 37 IU/L (ref 0–40)
Albumin/Globulin Ratio: 1.8 (ref 1.2–2.2)
Albumin: 4.2 g/dL (ref 3.8–4.9)
Alkaline Phosphatase: 75 IU/L (ref 44–121)
BUN/Creatinine Ratio: 19 (ref 12–28)
BUN: 14 mg/dL (ref 8–27)
Bilirubin Total: 1.5 mg/dL — ABNORMAL HIGH (ref 0.0–1.2)
CO2: 22 mmol/L (ref 20–29)
Calcium: 9.1 mg/dL (ref 8.7–10.3)
Chloride: 99 mmol/L (ref 96–106)
Creatinine, Ser: 0.72 mg/dL (ref 0.57–1.00)
GFR calc Af Amer: 105 mL/min/{1.73_m2} (ref >59)
GFR calc non Af Amer: 91 mL/min/{1.73_m2} (ref >59)
Globulin, Total: 2.4 g/dL (ref 1.5–4.5)
Glucose: 100 mg/dL — ABNORMAL HIGH (ref 65–99)
Potassium: 3.3 mmol/L — ABNORMAL LOW (ref 3.5–5.2)
Sodium: 137 mmol/L (ref 134–144)
Total Protein: 6.6 g/dL (ref 6.0–8.5)

## 2020-04-24 LAB — CBC WITH DIFFERENTIAL/PLATELET
Basophils Absolute: 0 10*3/uL (ref 0.0–0.2)
Basos: 1 %
EOS (ABSOLUTE): 0.2 10*3/uL (ref 0.0–0.4)
Eos: 3 %
Hematocrit: 37.1 % (ref 34.0–46.6)
Hemoglobin: 13 g/dL (ref 11.1–15.9)
Immature Grans (Abs): 0 10*3/uL (ref 0.0–0.1)
Immature Granulocytes: 0 %
Lymphocytes Absolute: 0.9 10*3/uL (ref 0.7–3.1)
Lymphs: 14 %
MCH: 30.8 pg (ref 26.6–33.0)
MCHC: 35 g/dL (ref 31.5–35.7)
MCV: 88 fL (ref 79–97)
Monocytes Absolute: 0.6 10*3/uL (ref 0.1–0.9)
Monocytes: 9 %
Neutrophils Absolute: 5.1 10*3/uL (ref 1.4–7.0)
Neutrophils: 73 %
Platelets: 112 10*3/uL — ABNORMAL LOW (ref 150–450)
RBC: 4.22 x10E6/uL (ref 3.77–5.28)
RDW: 13.2 % (ref 11.7–15.4)
WBC: 6.9 10*3/uL (ref 3.4–10.8)

## 2020-05-07 ENCOUNTER — Other Ambulatory Visit: Payer: Self-pay | Admitting: Physician Assistant

## 2020-05-07 ENCOUNTER — Other Ambulatory Visit: Payer: Self-pay | Admitting: Family Medicine

## 2020-05-08 ENCOUNTER — Encounter: Payer: Self-pay | Admitting: Physician Assistant

## 2020-05-20 ENCOUNTER — Telehealth: Payer: Self-pay

## 2020-05-20 NOTE — Progress Notes (Signed)
Chronic Care Management Pharmacy Assistant   Name: Shene Maxfield  MRN: [id number] DOB: [birth year]  Reason for Encounter: Initial question for appointment with Donette Larry, CPP  PCP : [name], PA-C  Allergies:   Allergies  Allergen Reactions   Penicillins Hives, Swelling, Rash and Other (See Comments)    Medications: Outpatient Encounter Medications as of 05/20/2020  Medication Sig   albuterol (PROVENTIL) (2.5 MG/3ML) 0.083% nebulizer solution INHALE 1 VIAL VIA NEBULIZER 3 TO 4 TIMES A DAY AS DIRECTED   albuterol (VENTOLIN HFA) 108 (90 Base) MCG/ACT inhaler INHALE 1 TO 2 PUFFS BY MOUTH EVERY 4 TO 6 HOURS AS NEEDED   betamethasone dipropionate (DIPROLENE) 0.05 % ointment APPLY SMALL AMOUNT EXTERNALLY TO THE AFFECTED AREA DAILY   budesonide-formoterol (SYMBICORT) 160-4.5 MCG/ACT inhaler Inhale 2 puffs into the lungs 2 (two) times daily.   busPIRone (BUSPAR) 15 MG tablet TK 1/2 T PO BID   citalopram (CELEXA) 20 MG tablet Take 1 tablet (20 mg total) by mouth daily.   IRON, FERROUS SULFATE, PO Take by mouth.   lactulose (CHRONULAC) 10 GM/15ML solution    levothyroxine (SYNTHROID) 100 MCG tablet TAKE 1 TABLET(100 MCG) BY MOUTH EVERY DAY   levothyroxine (SYNTHROID) 100 MCG tablet TAKE 1 TABLET(100 MCG) BY MOUTH EVERY DAY   LORazepam (ATIVAN) 1 MG tablet TAKE 1 TABLET(1 MG) BY MOUTH THREE TIMES DAILY AS NEEDED FOR ANXIETY   losartan-hydrochlorothiazide (HYZAAR) 100-25 MG tablet TAKE 1 TABLET BY MOUTH DAILY   meloxicam (MOBIC) 7.5 MG tablet Take 7.5 mg by mouth 2 (two) times daily.   oxyCODONE (OXY IR/ROXICODONE) 5 MG immediate release tablet Take 1 tablet (5 mg total) by mouth 2 (two) times daily as needed.   potassium chloride SA (KLOR-CON) 20 MEQ tablet TAKE 2 TABLETS BY MOUTH TWICE DAILY   promethazine (PHENERGAN) 25 MG tablet Take 1 tablet (25 mg total) by mouth every 8 (eight) hours as needed for nausea or vomiting.   Vitamin D, Ergocalciferol,  (DRISDOL) 1.25 MG (50000 UNIT) CAPS capsule Take 1 capsule (50,000 Units total) by mouth every 7 (seven) days.   No facility-administered encounter medications on file as of 05/20/2020.    Current Diagnosis: Patient Active Problem List   Diagnosis Date Noted   Visit for screening mammogram 04/23/2020   Polyuria 01/16/2020   Need for prophylactic vaccination and inoculation against influenza 01/16/2020   Need for prophylactic vaccination against Streptococcus pneumoniae (pneumococcus) 01/16/2020   Moderate persistent asthma without complication 27/74/1287   Herpes zoster without complication 86/76/7209   Vitamin D deficiency 07/02/2019   Thrombocytopenia ([place]) 07/05/2016   History of hepatitis C 07/05/2016   Hypothyroid 10/17/2012   Asthma 10/17/2012   GERD (gastroesophageal reflux disease) 10/17/2012   Chronic hepatitis C virus infection ([place]) 10/16/2012   ALLERGIC RHINITIS 07/31/2008   PULMONARY NODULE 06/21/2007   DYSPNEA 06/06/2007   COUGH 06/06/2007   Benign hypertension 05/25/2007   ASTHMA 05/25/2007   Have you seen any other providers since your last visit?No  Any changes in your medications or health?Yes, Patient stated she is having an issue with her asthma  Any side effects from any medications? Patient is not having any side effects.  Do you have an symptoms or problems not managed by your medications? Patient stated she is not having any current unmanaged issues.  Any concerns about your health right now? Patient stated she would like to get off blood pressure medication.  Has your provider asked that you check blood pressure,  blood sugar, or follow special diet at home? Patient stated she checks her blood pressure daily,  Usually runs around 140/77, she eats a low salt, high iron, high potassium diet.  Do you get any type of exercise on a regular basis? Patient stated she goes to the [facility] twice a week.  Can you think of a goal you would like to  reach for your health? Patient would like to get off her blood pressure medications.  Do you have any problems getting your medications? Patient stated there is no current issues with medication.  Is there anything that you would like to discuss during the appointment? Patient would like to discuss ways to improve her blood pressure.  Patient is aware to  bring medications and supplements to appointment    Follow-Up:  Pharmacist Review  Donette Larry, [place], [place] Pharmacist Assistant [phone]

## 2020-05-21 NOTE — Chronic Care Management (AMB) (Unsigned)
Chronic Care Management Pharmacy  Name: Natalie Perry  MRN: [id number] DOB: 1960/07/08  Chief Complaint/ HPI  Natalie Perry,  60 y.o. , female presents for their Initial CCM visit with the clinical pharmacist via telephone due to COVID-19 Pandemic.  PCP : [name], PA-C  Their chronic conditions include: hypertension, asthma, chronic hepatitis C, GERD, hypothyroid, thrombocytopenia, vitamin D deficiency.   Office Visits: 04/23/2020 - labs drawn. Potassium low.  01/16/2020 - oxycodone for shingles pain. Flu shot given. Pneumovax 23 given.  Consult Visit: 12/25/2019 - [facility] - no note avail.  Medications: Outpatient Encounter Medications as of 05/22/2020  Medication Sig  . albuterol (PROVENTIL) (2.5 MG/3ML) 0.083% nebulizer solution INHALE 1 VIAL VIA NEBULIZER 3 TO 4 TIMES A DAY AS DIRECTED  . albuterol (VENTOLIN HFA) 108 (90 Base) MCG/ACT inhaler INHALE 1 TO 2 PUFFS BY MOUTH EVERY 4 TO 6 HOURS AS NEEDED  . betamethasone dipropionate (DIPROLENE) 0.05 % ointment Apply topically daily as needed.  . budesonide-formoterol (SYMBICORT) 160-4.5 MCG/ACT inhaler Inhale 2 puffs into the lungs 2 (two) times daily.  . cholecalciferol (VITAMIN D3) 25 MCG (1000 UNIT) tablet Take 1,000 Units by mouth daily.  . IRON, FERROUS SULFATE, PO Take 1 tablet by mouth daily.  Marland Kitchen levothyroxine (SYNTHROID) 100 MCG tablet TAKE 1 TABLET(100 MCG) BY MOUTH EVERY DAY  . LORazepam (ATIVAN) 1 MG tablet TAKE 1 TABLET(1 MG) BY MOUTH THREE TIMES DAILY AS NEEDED FOR ANXIETY  . losartan-hydrochlorothiazide (HYZAAR) 100-25 MG tablet TAKE 1 TABLET BY MOUTH DAILY  . meloxicam (MOBIC) 7.5 MG tablet Take 7.5 mg by mouth 2 (two) times daily as needed.  Marland Kitchen oxyCODONE (OXY IR/ROXICODONE) 5 MG immediate release tablet Take 1 tablet (5 mg total) by mouth 2 (two) times daily as needed.  . potassium chloride SA (KLOR-CON) 20 MEQ tablet TAKE 2 TABLETS BY MOUTH TWICE DAILY  . busPIRone (BUSPAR) 15 MG tablet TK  1/2 T PO BID (Patient not taking: Reported on 05/22/2020)  . citalopram (CELEXA) 20 MG tablet Take 1 tablet (20 mg total) by mouth daily. (Patient not taking: Reported on 05/22/2020)  . lactulose (CHRONULAC) 10 GM/15ML solution  (Patient not taking: Reported on 05/22/2020)  . promethazine (PHENERGAN) 25 MG tablet Take 1 tablet (25 mg total) by mouth every 8 (eight) hours as needed for nausea or vomiting. (Patient not taking: Reported on 05/22/2020)  . Vitamin D, Ergocalciferol, (DRISDOL) 1.25 MG (50000 UNIT) CAPS capsule Take 1 capsule (50,000 Units total) by mouth every 7 (seven) days. (Patient not taking: Reported on 05/22/2020)  . [DISCONTINUED] levothyroxine (SYNTHROID) 100 MCG tablet TAKE 1 TABLET(100 MCG) BY MOUTH EVERY DAY   No facility-administered encounter medications on file as of 05/22/2020.   Allergies  Allergen Reactions  . Penicillins Hives, Swelling, Rash and Other (See Comments)   SDOH Screenings   Alcohol Screen: Not on file  Depression (PHQ2-9): Low Risk   . PHQ-2 Score: 0  Financial Resource Strain: Not on file  Food Insecurity: No Food Insecurity  . Worried About Charity fundraiser in the Last Year: Never true  . Ran Out of Food in the Last Year: Never true  Housing: Low Risk   . Last Housing Risk Score: 0  Physical Activity: Not on file  Social Connections: Not on file  Stress: Not on file  Tobacco Use: Low Risk   . Smoking Tobacco Use: Never Smoker  . Smokeless Tobacco Use: Never Used  Transportation Needs: No Transportation Needs  . Lack  of Transportation (Medical): No  . Lack of Transportation (Non-Medical): No   Allergies  Allergen Reactions  . Penicillins Hives, Swelling, Rash and Other (See Comments)   SDOH Screenings   Alcohol Screen: Not on file  Depression (PHQ2-9): Low Risk   . PHQ-2 Score: 0  Financial Resource Strain: Not on file  Food Insecurity: No Food Insecurity  . Worried About Charity fundraiser in the Last Year: Never true  . Ran Out  of Food in the Last Year: Never true  Housing: Low Risk   . Last Housing Risk Score: 0  Physical Activity: Not on file  Social Connections: Not on file  Stress: Not on file  Tobacco Use: Low Risk   . Smoking Tobacco Use: Never Smoker  . Smokeless Tobacco Use: Never Used  Transportation Needs: No Transportation Needs  . Lack of Transportation (Medical): No  . Lack of Transportation (Non-Medical): No    Current Diagnosis/Assessment:  Goals Addressed            This Visit's Progress   . Pharmacy Care Plan       CARE PLAN ENTRY (see longitudinal plan of care for additional care plan information)  Current Barriers:  . Chronic Disease Management support, education, and care coordination needs related to Hypertension and Hypothyroidism   Hypertension BP Readings from Last 3 Encounters:  04/23/20 132/80  01/16/20 (!) 148/78  10/12/19 (!) 152/78   . Pharmacist Clinical Goal(s): o Over the next 90 days, patient will work with PharmD and providers to maintain BP goal <140/90 . Current regimen:  o Losartan-hydrochlorothiazide 100-25 mg daily  . Interventions: o Discussed patient's goal of increasing exercise as tolerated to goal of 150 minutes each week.  o Encouraged patient to continue the good work on reducing sodium and eating more vegetables.  o Discussed benefits of weight loss and blood pressure control.  o Patient would like to discontinue diuretic and pharmacist encouraged weight loss, healthy diet and exercise to meet that goal. Patient will bring blood pressure log to review with Natalie Perry during next appointment.  . Patient self care activities - Over the next 90 days, patient will: o Check BP daily, document, and provide at future appointments o Ensure daily salt intake < 2300 mg/day   Asthma . Pharmacist Clinical Goal(s) o Over the next 90 days, patient will work with PharmD and providers to manage asthma symptoms . Current regimen:   Albuterol 2.5 mg/3 ml 3-4  times daily as directed  Ventolin 1-2 puffs every 4-6 hours prn   Symbicort 160/4.5 mcg/act 2 puffs twice daily  . Interventions: o Patient would like to begin getting medication delivered from [facility] and need albuterol nebules soon. Pharmacist determining if covered under part B or D for patient.  o Reviewed medication regimen and adherence.  . Patient self care activities - Over the next 90 days, patient will: o Continue to use Symbicort twice daily as prescribed.  o Refill albuterol nebules via [facility] if covered.   Medication management . Pharmacist Clinical Goal(s): o Over the next 90 days, patient will work with PharmD and providers to maintain optimal medication adherence . Current pharmacy: [facility] . Interventions o Comprehensive medication review performed. o Utilize [facility] for medication synchronization, packaging and delivery . Patient self care activities - Over the next 90 days, patient will: o Focus on medication adherence by using pill box o Take medications as prescribed o Report any questions or concerns to PharmD and/or provider(s)  Initial  goal documentation        COPD / Asthma / Tobacco    Eosinophil count:   Lab Results  Component Value Date/Time   EOSPCT 1.5 12/13/2016 01:06 PM  %                               Eos (Absolute):  Lab Results  Component Value Date/Time   EOSABS 0.2 04/23/2020 10:30 AM   EOSABS 0.1 12/13/2016 01:06 PM    Tobacco Status:  Social History   Tobacco Use  Smoking Status Never Smoker  Smokeless Tobacco Never Used    Patient has failed these meds in past: benzonotate, tussionex, loratadine  Patient is currently controlled on the following medications:   Albuterol 2.5 mg/3 ml 3-4 times daily as directed  Ventolin 1-2 puffs every 4-6 hours prn   Symbicort 160/4.5 mcg/act 2 puffs twice daily   Using maintenance inhaler regularly? Yes Frequency of rescue inhaler use:  daily  We discussed:   proper inhaler technique. Has seen pulmonologist in the past. Takes Symbicort twice daily and uses albuterol daily lately due to changes in weather lately. Sensitive to ragweed, dust, wind. Patient reports that she will be out of albuterol nebules soon. Pharmacist will determine if covered at [facility] per patient request. If covered under part B, patient will continue to get locally.   Plan  Continue current medications   Hypertension   BP today is:  <140/90  Office blood pressures are  BP Readings from Last 3 Encounters:  04/23/20 132/80  01/16/20 (!) 148/78  10/12/19 (!) 152/78    Patient has failed these meds in the past: none reported Patient is currently controlled on the following medications:   Losartan-hydrochlorothiazide 100-25 mg daily  Patient checks BP at home daily  Patient home BP readings are ranging: 138-140/77 mmHg  We discussed diet and exercise extensively  Goes to the [facility] for water aerobics at least 2 times each week. Can't walk very far due to knee pain. Both need replacement. Patient has a strap to do exercise at home in chair but feels water helps the most. Discussed goal of 150 minutes each week. Patient reports that she would like to increase her activity at the [facility].   Patient is trying to lose weight and get her knee stronger before replacement.  Patient would also like to lose enough weight and improve blood pressure to be able to discontinue her diuretic if possible. Discussed importance of good blood pressure control, low sodium diet. Patient plans to bring blood pressure readings into next visit with Natalie Perry to assess control.   Diet:   - breakfast - oatmeal and orange  - lunch and supper - rice and chicken  Patient has improved her diet by watching sodium and increasing vegetables since the new year. Has worked to avoid sweet treats and increasing water. Pharmacist encouraged patient to continue improving diet and work toward 150 minutes of exercise  each week.    Plan  Continue current medications and control with diet and exercise   Hypothyroidism   Lab Results  Component Value Date/Time   TSH 4.320 01/16/2020 11:05 AM   TSH 3.670 10/12/2019 11:31 AM    Patient has failed these meds in past: none reported Patient is currently controlled on the following medications:  . levothyroxine 100 mcg daily   We discussed:  Patient reports good compliance with medication.   Plan  Continue current medications  Bipolar Disease   Patient has failed these meds in past: alprazolam, buspirone, citalopram  Patient is currently controlled on the following medications:  . Lorazepam 1 mg tid prn anxiety   Depression screen [facility] 2/9 05/22/2020 04/23/2020  Decreased Interest 0 0  Down, Depressed, Hopeless 0 0  PHQ - 2 Score 0 0     We discussed:   Reports taking lorazepam maybe 1-2 times a week. Patient reports anxiety attacks. Not taking citalopram due to making her stomach hurt. Buspirone made her feel funny. Patient denies symptoms of depression. Patient states that her symptoms of anxiety have been stable since discontinuing citalopram on her own due to stomach pain.   Plan  Continue current medications   Pain   Patient has failed these meds in past: none reported Patient is currently controlled on the following medications:  . meloxicam 7.5 mg bid prn   . Oxycodone 5 mg bid prn  We discussed:  Taking oxycodone to be able to walk in public. Thinks she takes about 2 tablets each week. States that she takes meloxicam rarely since it doesn't seem to help. Encouraged patient to continue water aerobics.   Plan  Continue current medications  Health Maintenance   Patient is currently controlled on the following medications:   . promethazine prn nausea/vomiting - rarely takes only as needed . Diprolene 0.05% ointment daily prn - rash on leg . Ferrous sulfate - supplementation . Potassium 20 meq 2 tablets by mouth bid - low  potassium  . Vitamin D OTC daily - supplementation  We discussed:  Patient denies concerns with medication.   Plan  Continue current medications  Vaccines   Reviewed and discussed patient's vaccination history. Patient has not had shingles shot. She had shingles and waiting to get it once allowed. Has not had third Covid shot.   Immunization History  Administered Date(s) Administered  . Influenza Inj Mdck Quad Pf 01/16/2020  . Influenza-Unspecified 02/08/2019  . PFIZER SARS-COV-2 Vaccination 08/10/2019, 08/31/2019  . Pneumococcal Conjugate-13 03/18/2014  . Pneumococcal Polysaccharide-23 01/16/2020  . Pneumococcal-Unspecified 04/06/2011  . Tdap 01/31/2012    Plan  Recommended patient receive COVID Booster vaccine.   Medication Management   Patient's preferred pharmacy is:  [id number] - [place], [place] [address] [place] [address] Phone: [phone] Fax: [phone]  [facility] - [place], [place] - [address] [place] [address] Phone: [phone] Fax: [phone]  Uses pill box? Yes Pt endorses good compliance  We discussed: Discussed benefits of medication synchronization, packaging and delivery as well as enhanced pharmacist oversight with [facility].  Verbal consent obtained for [facility] enhanced pharmacy services (medication synchronization, adherence packaging, delivery coordination). A medication sync plan was created to allow patient to get all medications delivered once every 30 to 90 days per patient preference. Patient understands they have freedom to choose pharmacy and clinical pharmacist will coordinate care between all prescribers and [facility].   Plan  Utilize [facility] pharmacy for medication synchronization, packaging and delivery    Follow up: 3 month phone visit

## 2020-05-22 ENCOUNTER — Ambulatory Visit: Payer: PPO

## 2020-05-22 ENCOUNTER — Other Ambulatory Visit: Payer: Self-pay

## 2020-05-22 ENCOUNTER — Telehealth: Payer: Self-pay

## 2020-05-22 DIAGNOSIS — J4540 Moderate persistent asthma, uncomplicated: Secondary | ICD-10-CM

## 2020-05-22 DIAGNOSIS — I10 Essential (primary) hypertension: Secondary | ICD-10-CM

## 2020-05-22 NOTE — Telephone Encounter (Signed)
Referral CCM with Emeterio Reeve.

## 2020-05-22 NOTE — Patient Instructions (Addendum)
Visit Information  Thank you for your time discussing your medications. I look forward to working with you to achieve your health care goals. Below is a summary of what we talked about during our visit.   Goals Addressed            This Visit's Progress   . Pharmacy Care Plan       CARE PLAN ENTRY (see longitudinal plan of care for additional care plan information)  Current Barriers:  . Chronic Disease Management support, education, and care coordination needs related to Hypertension and Hypothyroidism   Hypertension BP Readings from Last 3 Encounters:  04/23/20 132/80  01/16/20 (!) 148/78  10/12/19 (!) 152/78   . Pharmacist Clinical Goal(s): o Over the next 90 days, patient will work with PharmD and providers to maintain BP goal <140/90 . Current regimen:  o Losartan-hydrochlorothiazide 100-25 mg daily  . Interventions: o Discussed patient's goal of increasing exercise as tolerated to goal of 150 minutes each week.  o Encouraged patient to continue the good work on reducing sodium and eating more vegetables.  o Discussed benefits of weight loss and blood pressure control.  o Patient would like to discontinue diuretic and pharmacist encouraged weight loss, healthy diet and exercise to meet that goal. Patient will bring blood pressure log to review with Natalie Perry during next appointment.  . Patient self care activities - Over the next 90 days, patient will: o Check BP daily, document, and provide at future appointments o Ensure daily salt intake < 2300 mg/day   Asthma . Pharmacist Clinical Goal(s) o Over the next 90 days, patient will work with PharmD and providers to manage asthma symptoms . Current regimen:   Albuterol 2.5 mg/3 ml 3-4 times daily as directed  Ventolin 1-2 puffs every 4-6 hours prn   Symbicort 160/4.5 mcg/act 2 puffs twice daily  . Interventions: o Patient would like to begin getting medication delivered from [facility] and need albuterol nebules soon.  Pharmacist determining if covered under part B or D for patient.  o Reviewed medication regimen and adherence.  . Patient self care activities - Over the next 90 days, patient will: o Continue to use Symbicort twice daily as prescribed.  o Refill albuterol nebules via [facility] if covered.   Medication management . Pharmacist Clinical Goal(s): o Over the next 90 days, patient will work with PharmD and providers to maintain optimal medication adherence . Current pharmacy: [facility] . Interventions o Comprehensive medication review performed. o Utilize [facility] pharmacy for medication synchronization, packaging and delivery . Patient self care activities - Over the next 90 days, patient will: o Focus on medication adherence by using pill box o Take medications as prescribed o Report any questions or concerns to PharmD and/or provider(s)  Initial goal documentation        Natalie Perry was given information about Chronic Care Management services today including:  1. CCM service includes personalized support from designated clinical staff supervised by her physician, including individualized plan of care and coordination with other care providers 2. 24/7 contact phone numbers for assistance for urgent and routine care needs. 3. Standard insurance, coinsurance, copays and deductibles apply for chronic care management only during months in which we provide at least 20 minutes of these services. Most insurances cover these services at 100%, however patients may be responsible for any copay, coinsurance and/or deductible if applicable. This service may help you avoid the need for more expensive face-to-face services. 4. Only one practitioner may furnish and  bill the service in a calendar month. 5. The patient may stop CCM services at any time (effective at the end of the month) by phone call to the office staff.  Patient agreed to services and verbal consent obtained.   The patient  verbalized understanding of instructions, educational materials, and care plan provided today and agreed to receive a mailed copy of patient instructions, educational materials, and care plan.  Telephone follow up appointment with pharmacy team member scheduled for: 08/2020  Sherre Poot, PharmD Clinical Pharmacist [facility] [phone] (office) [phone] (mobile)   [email or web address]">  DASH Eating Plan DASH stands for Dietary Approaches to Stop Hypertension. The DASH eating plan is a healthy eating plan that has been shown to:  Reduce high blood pressure (hypertension).  Reduce your risk for type 2 diabetes, heart disease, and stroke.  Help with weight loss. What are tips for following this plan? Reading food labels  Check food labels for the amount of salt (sodium) per serving. Choose foods with less than 5 percent of the Daily Value of sodium. Generally, foods with less than 300 milligrams (mg) of sodium per serving fit into this eating plan.  To find whole grains, look for the word "whole" as the first word in the ingredient list. Shopping  Buy products labeled as "low-sodium" or "no salt added."  Buy fresh foods. Avoid canned foods and pre-made or frozen meals. Cooking  Avoid adding salt when cooking. Use salt-free seasonings or herbs instead of table salt or sea salt. Check with your health care provider or pharmacist before using salt substitutes.  Do not fry foods. Cook foods using healthy methods such as baking, boiling, grilling, roasting, and broiling instead.  Cook with heart-healthy oils, such as olive, canola, avocado, soybean, or sunflower oil. Meal planning  Eat a balanced diet that includes: ? 4 or more servings of fruits and 4 or more servings of vegetables each day. Try to fill one-half of your plate with fruits and vegetables. ? 6-8 servings of whole grains each day. ? Less than 6 oz (170 g)  of lean meat, poultry, or fish each day. A 3-oz (85-g) serving of meat is about the same size as a deck of cards. One egg equals 1 oz (28 g). ? 2-3 servings of low-fat dairy each day. One serving is 1 cup (237 mL). ? 1 serving of nuts, seeds, or beans 5 times each week. ? 2-3 servings of heart-healthy fats. Healthy fats called omega-3 fatty acids are found in foods such as walnuts, flaxseeds, fortified milks, and eggs. These fats are also found in cold-water fish, such as sardines, salmon, and mackerel.  Limit how much you eat of: ? Canned or prepackaged foods. ? Food that is high in trans fat, such as some fried foods. ? Food that is high in saturated fat, such as fatty meat. ? Desserts and other sweets, sugary drinks, and other foods with added sugar. ? Full-fat dairy products.  Do not salt foods before eating.  Do not eat more than 4 egg yolks a week.  Try to eat at least 2 vegetarian meals a week.  Eat more home-cooked food and less restaurant, buffet, and fast food.   Lifestyle  When eating at a restaurant, ask that your food be prepared with less salt or no salt, if possible.  If you drink alcohol: ? Limit how much you use to:  0-1 drink a day for women who are not pregnant.  0-2 drinks a day  for men. ? Be aware of how much alcohol is in your drink. In the [place] one drink equals one 12 oz bottle of beer (355 mL), one 5 oz glass of wine (148 mL), or one 1 oz glass of hard liquor (44 mL). General information  Avoid eating more than 2,300 mg of salt a day. If you have hypertension, you may need to reduce your sodium intake to 1,500 mg a day.  Work with your health care provider to maintain a healthy body weight or to lose weight. Ask what an ideal weight is for you.  Get at least 30 minutes of exercise that causes your heart to beat faster (aerobic exercise) most days of the week. Activities may include walking, swimming, or biking.  Work with your health care provider or  dietitian to adjust your eating plan to your individual calorie needs. What foods should I eat? Fruits All fresh, dried, or frozen fruit. Canned fruit in natural juice (without added sugar). Vegetables Fresh or frozen vegetables (raw, steamed, roasted, or grilled). Low-sodium or reduced-sodium tomato and vegetable juice. Low-sodium or reduced-sodium tomato sauce and tomato paste. Low-sodium or reduced-sodium canned vegetables. Grains Whole-grain or whole-wheat bread. Whole-grain or whole-wheat pasta. Marianela Mandrell rice. [place] Morrow. Bulgur. Whole-grain and low-sodium cereals. Pita bread. Low-fat, low-sodium crackers. Whole-wheat flour tortillas. Meats and other proteins Skinless chicken or [place]. Ground chicken or [place]. Pork with fat trimmed off. Fish and seafood. Egg whites. Dried beans, peas, or lentils. Unsalted nuts, nut butters, and seeds. Unsalted canned beans. Lean cuts of beef with fat trimmed off. Low-sodium, lean precooked or cured meat, such as sausages or meat loaves. Dairy Low-fat (1%) or fat-free (skim) milk. Reduced-fat, low-fat, or fat-free cheeses. Nonfat, low-sodium ricotta or cottage cheese. Low-fat or nonfat yogurt. Low-fat, low-sodium cheese. Fats and oils Soft margarine without trans fats. Vegetable oil. Reduced-fat, low-fat, or light mayonnaise and salad dressings (reduced-sodium). Canola, safflower, olive, avocado, soybean, and sunflower oils. Avocado. Seasonings and condiments Herbs. Spices. Seasoning mixes without salt. Other foods Unsalted popcorn and pretzels. Fat-free sweets. The items listed above may not be a complete list of foods and beverages you can eat. Contact a dietitian for more information. What foods should I avoid? Fruits Canned fruit in a light or heavy syrup. Fried fruit. Fruit in cream or butter sauce. Vegetables Creamed or fried vegetables. Vegetables in a cheese sauce. Regular canned vegetables (not low-sodium or reduced-sodium). Regular canned  tomato sauce and paste (not low-sodium or reduced-sodium). Regular tomato and vegetable juice (not low-sodium or reduced-sodium). Angie Fava. Olives. Grains Baked goods made with fat, such as croissants, muffins, or some breads. Dry pasta or rice meal packs. Meats and other proteins Fatty cuts of meat. Ribs. Fried meat. Berniece Salines. Bologna, salami, and other precooked or cured meats, such as sausages or meat loaves. Fat from the back of a pig (fatback). Bratwurst. Salted nuts and seeds. Canned beans with added salt. Canned or smoked fish. Whole eggs or egg yolks. Chicken or [place] with skin. Dairy Whole or 2% milk, cream, and half-and-half. Whole or full-fat cream cheese. Whole-fat or sweetened yogurt. Full-fat cheese. Nondairy creamers. Whipped toppings. Processed cheese and cheese spreads. Fats and oils Butter. Stick margarine. Lard. Shortening. Ghee. Bacon fat. Tropical oils, such as coconut, palm kernel, or palm oil. Seasonings and condiments Onion salt, garlic salt, seasoned salt, table salt, and sea salt. Worcestershire sauce. Tartar sauce. Barbecue sauce. Teriyaki sauce. Soy sauce, including reduced-sodium. Steak sauce. Canned and packaged gravies. Fish sauce. Oyster sauce. Cocktail sauce. Store-bought  horseradish. Ketchup. Mustard. Meat flavorings and tenderizers. Bouillon cubes. Hot sauces. Pre-made or packaged marinades. Pre-made or packaged taco seasonings. Relishes. Regular salad dressings. Other foods Salted popcorn and pretzels. The items listed above may not be a complete list of foods and beverages you should avoid. Contact a dietitian for more information. Where to find more information  National Heart, Lung, and Blood Institute: https://wilson-eaton.com/  American Heart Association: www.heart.org  Academy of Nutrition and Dietetics: www.eatright.[place]: www.kidney.org Summary  The DASH eating plan is a healthy eating plan that has been shown to reduce high blood  pressure (hypertension). It may also reduce your risk for type 2 diabetes, heart disease, and stroke.  When on the DASH eating plan, aim to eat more fresh fruits and vegetables, whole grains, lean proteins, low-fat dairy, and heart-healthy fats.  With the DASH eating plan, you should limit salt (sodium) intake to 2,300 mg a day. If you have hypertension, you may need to reduce your sodium intake to 1,500 mg a day.  Work with your health care provider or dietitian to adjust your eating plan to your individual calorie needs. This information is not intended to replace advice given to you by your health care provider. Make sure you discuss any questions you have with your health care provider. Document Revised: 03/30/2019 Document Reviewed: 03/30/2019 Elsevier Patient Education  2021 [employer].

## 2020-05-23 ENCOUNTER — Telehealth: Payer: Self-pay

## 2020-05-23 NOTE — Progress Notes (Signed)
Chronic Care Management Pharmacy Assistant   Name: Natalie Perry  MRN: [id number] DOB: 1960-09-05  Reason for Encounter: Medication Review for [facility] coordination   PCP : [name], PA-C  Allergies:   Allergies  Allergen Reactions   Penicillins Hives, Swelling, Rash and Other (See Comments)    Medications: Outpatient Encounter Medications as of 05/23/2020  Medication Sig   albuterol (PROVENTIL) (2.5 MG/3ML) 0.083% nebulizer solution INHALE 1 VIAL VIA NEBULIZER 3 TO 4 TIMES A DAY AS DIRECTED   albuterol (VENTOLIN HFA) 108 (90 Base) MCG/ACT inhaler INHALE 1 TO 2 PUFFS BY MOUTH EVERY 4 TO 6 HOURS AS NEEDED   betamethasone dipropionate (DIPROLENE) 0.05 % ointment Apply topically daily as needed.   budesonide-formoterol (SYMBICORT) 160-4.5 MCG/ACT inhaler Inhale 2 puffs into the lungs 2 (two) times daily.   busPIRone (BUSPAR) 15 MG tablet TK 1/2 T PO BID (Patient not taking: Reported on 05/22/2020)   cholecalciferol (VITAMIN D3) 25 MCG (1000 UNIT) tablet Take 1,000 Units by mouth daily.   citalopram (CELEXA) 20 MG tablet Take 1 tablet (20 mg total) by mouth daily. (Patient not taking: Reported on 05/22/2020)   IRON, FERROUS SULFATE, PO Take 1 tablet by mouth daily.   lactulose (CHRONULAC) 10 GM/15ML solution  (Patient not taking: Reported on 05/22/2020)   levothyroxine (SYNTHROID) 100 MCG tablet TAKE 1 TABLET(100 MCG) BY MOUTH EVERY DAY   LORazepam (ATIVAN) 1 MG tablet TAKE 1 TABLET(1 MG) BY MOUTH THREE TIMES DAILY AS NEEDED FOR ANXIETY   losartan-hydrochlorothiazide (HYZAAR) 100-25 MG tablet TAKE 1 TABLET BY MOUTH DAILY   meloxicam (MOBIC) 7.5 MG tablet Take 7.5 mg by mouth 2 (two) times daily as needed.   oxyCODONE (OXY IR/ROXICODONE) 5 MG immediate release tablet Take 1 tablet (5 mg total) by mouth 2 (two) times daily as needed.   potassium chloride SA (KLOR-CON) 20 MEQ tablet TAKE 2 TABLETS BY MOUTH TWICE DAILY   promethazine (PHENERGAN) 25 MG tablet  Take 1 tablet (25 mg total) by mouth every 8 (eight) hours as needed for nausea or vomiting. (Patient not taking: Reported on 05/22/2020)   Vitamin D, Ergocalciferol, (DRISDOL) 1.25 MG (50000 UNIT) CAPS capsule Take 1 capsule (50,000 Units total) by mouth every 7 (seven) days. (Patient not taking: Reported on 05/22/2020)   No facility-administered encounter medications on file as of 05/23/2020.    Current Diagnosis: Patient Active Problem List   Diagnosis Date Noted   Visit for screening mammogram 04/23/2020   Polyuria 01/16/2020   Need for prophylactic vaccination and inoculation against influenza 01/16/2020   Need for prophylactic vaccination against Streptococcus pneumoniae (pneumococcus) 01/16/2020   Moderate persistent asthma without complication 03/03/8526   Herpes zoster without complication 78/24/2353   Vitamin D deficiency 07/02/2019   Thrombocytopenia ([place]) 07/05/2016   History of hepatitis C 07/05/2016   Hypothyroid 10/17/2012   Asthma 10/17/2012   GERD (gastroesophageal reflux disease) 10/17/2012   Chronic hepatitis C virus infection ([place]) 10/16/2012   ALLERGIC RHINITIS 07/31/2008   PULMONARY NODULE 06/21/2007   DYSPNEA 06/06/2007   COUGH 06/06/2007   Benign hypertension 05/25/2007   ASTHMA 05/25/2007   Reviewed chart for medication changes ahead of medication coordination call.  No OVs, Consults, or hospital visits since last care coordination call/Pharmacist visit. (If appropriate, list visit date, provider name)  No medication changes indicated OR if recent visit, treatment plan here.  BP Readings from Last 3 Encounters:  04/23/20 132/80  01/16/20 (!) 148/78  10/12/19 (!) 152/78    No  results found for: HGBA1C   Patient obtains medications through Vials  90 Days   Last adherence delivery included: Patient has no prior deliveries.  Patient has not declined any previous medication  Patient is due for next adherence delivery on:  06/10/2020  Called patient and reviewed medications and coordinated delivery.  This delivery to include: Levothyroxine 138mcg Potassium ER [facility]  Patient will need a short fill of (med), prior to adherence delivery. (To align with sync date or if PRN med)  Coordinated acute fill for (med) to be delivered (date).  Patient declined the following medications  Albuterol Nebules- has 1 month left- Will call  Albuterol Inhaler- 1 left - Will call Budesonide Inhaler 160/4.5- 1 left-Will call Lorazapam 1MG -PRN Losartan HCTZ 100/25- Refill 90DS on 05/07/20 Meloxicam 7.5MG  PRN Oxycodone 5MG - PRN  Betamethasone 0.05% cream- PRN Vitamin D3 1000 Unit- gets OTC Ferrous Sulfate- gets OTC Multivitamin - gets OTC  Patient needs no refills.  Confirmed delivery date of 06/10/2020, advised patient that pharmacy will contact them the morning of delivery.  Follow-Up:  Comptroller and Pharmacist Review  Donette Larry, CPP  Notified  Clarita Leber, [place] Pharmacist Assistant [phone]

## 2020-06-04 DIAGNOSIS — Z1231 Encounter for screening mammogram for malignant neoplasm of breast: Secondary | ICD-10-CM | POA: Diagnosis not present

## 2020-06-06 ENCOUNTER — Telehealth: Payer: Self-pay

## 2020-06-06 NOTE — Progress Notes (Signed)
Chronic Care Management Pharmacy Assistant   Name: Natalie Perry  MRN: [id number] DOB: [birth year]  Reason for Encounter: Medication Review for delivery with [facility]  [facility] has been unable to reach patient about her delivery.  At the request of Donette Larry, CPP, I called and was able to reach patient.  I let her know that our pharmacy had been trying to reach her.  She stated she had an appointment for Mammogram and one other time she was playing bingo.  I confirmed the pharmacy number with her and asked her to call as soon as possible.  PCP : [name], PA-C  Allergies:   Allergies  Allergen Reactions   Penicillins Hives, Swelling, Rash and Other (See Comments)    Medications: Outpatient Encounter Medications as of 06/06/2020  Medication Sig   albuterol (PROVENTIL) (2.5 MG/3ML) 0.083% nebulizer solution INHALE 1 VIAL VIA NEBULIZER 3 TO 4 TIMES A DAY AS DIRECTED   albuterol (VENTOLIN HFA) 108 (90 Base) MCG/ACT inhaler INHALE 1 TO 2 PUFFS BY MOUTH EVERY 4 TO 6 HOURS AS NEEDED   betamethasone dipropionate (DIPROLENE) 0.05 % ointment Apply topically daily as needed.   budesonide-formoterol (SYMBICORT) 160-4.5 MCG/ACT inhaler Inhale 2 puffs into the lungs 2 (two) times daily.   busPIRone (BUSPAR) 15 MG tablet TK 1/2 T PO BID (Patient not taking: Reported on 05/22/2020)   cholecalciferol (VITAMIN D3) 25 MCG (1000 UNIT) tablet Take 1,000 Units by mouth daily.   citalopram (CELEXA) 20 MG tablet Take 1 tablet (20 mg total) by mouth daily. (Patient not taking: Reported on 05/22/2020)   IRON, FERROUS SULFATE, PO Take 1 tablet by mouth daily.   lactulose (CHRONULAC) 10 GM/15ML solution  (Patient not taking: Reported on 05/22/2020)   levothyroxine (SYNTHROID) 100 MCG tablet TAKE 1 TABLET(100 MCG) BY MOUTH EVERY DAY   LORazepam (ATIVAN) 1 MG tablet TAKE 1 TABLET(1 MG) BY MOUTH THREE TIMES DAILY AS NEEDED FOR ANXIETY   losartan-hydrochlorothiazide (HYZAAR) 100-25 MG  tablet TAKE 1 TABLET BY MOUTH DAILY   meloxicam (MOBIC) 7.5 MG tablet Take 7.5 mg by mouth 2 (two) times daily as needed.   oxyCODONE (OXY IR/ROXICODONE) 5 MG immediate release tablet Take 1 tablet (5 mg total) by mouth 2 (two) times daily as needed.   potassium chloride SA (KLOR-CON) 20 MEQ tablet TAKE 2 TABLETS BY MOUTH TWICE DAILY   promethazine (PHENERGAN) 25 MG tablet Take 1 tablet (25 mg total) by mouth every 8 (eight) hours as needed for nausea or vomiting. (Patient not taking: Reported on 05/22/2020)   Vitamin D, Ergocalciferol, (DRISDOL) 1.25 MG (50000 UNIT) CAPS capsule Take 1 capsule (50,000 Units total) by mouth every 7 (seven) days. (Patient not taking: Reported on 05/22/2020)   No facility-administered encounter medications on file as of 06/06/2020.    Current Diagnosis: Patient Active Problem List   Diagnosis Date Noted   Visit for screening mammogram 04/23/2020   Polyuria 01/16/2020   Need for prophylactic vaccination and inoculation against influenza 01/16/2020   Need for prophylactic vaccination against Streptococcus pneumoniae (pneumococcus) 01/16/2020   Moderate persistent asthma without complication 71/10/2692   Herpes zoster without complication 85/46/2703   Vitamin D deficiency 07/02/2019   Thrombocytopenia ([place]) 07/05/2016   History of hepatitis C 07/05/2016   Hypothyroid 10/17/2012   Asthma 10/17/2012   GERD (gastroesophageal reflux disease) 10/17/2012   Chronic hepatitis C virus infection ([place]) 10/16/2012   ALLERGIC RHINITIS 07/31/2008   PULMONARY NODULE 06/21/2007   DYSPNEA 06/06/2007   COUGH 06/06/2007  Benign hypertension 05/25/2007   ASTHMA 05/25/2007    Follow-Up:  Coordination of Enhanced Pharmacy Services and Pharmacist Review  Donette Larry, CPP notified  Clarita Leber, [place] Pharmacist Assistant [phone]

## 2020-07-15 ENCOUNTER — Encounter: Payer: Self-pay | Admitting: Physician Assistant

## 2020-07-16 DIAGNOSIS — J4541 Moderate persistent asthma with (acute) exacerbation: Secondary | ICD-10-CM | POA: Diagnosis not present

## 2020-07-16 DIAGNOSIS — J988 Other specified respiratory disorders: Secondary | ICD-10-CM | POA: Diagnosis not present

## 2020-07-16 DIAGNOSIS — R350 Frequency of micturition: Secondary | ICD-10-CM | POA: Diagnosis not present

## 2020-07-20 ENCOUNTER — Other Ambulatory Visit: Payer: Self-pay | Admitting: Family Medicine

## 2020-07-21 ENCOUNTER — Telehealth: Payer: Self-pay

## 2020-07-21 NOTE — Progress Notes (Signed)
Chronic Care Management Pharmacy Assistant   Name: Natalie Perry  MRN: [id number] DOB: 09/30/1960   Reason for Encounter: Medication Review for Upstream delivery     Recent office visits:  06/04/20-mammogram  Recent consult visits:  none  Hospital visits:  10/05/19-[facility]  Medications: Outpatient Encounter Medications as of 07/21/2020  Medication Sig  . albuterol (PROVENTIL) (2.5 MG/3ML) 0.083% nebulizer solution INHALE 1 VIAL VIA NEBULIZER 3 TO 4 TIMES A DAY AS DIRECTED  . albuterol (VENTOLIN HFA) 108 (90 Base) MCG/ACT inhaler INHALE 1 TO 2 PUFFS BY MOUTH EVERY 4 TO 6 HOURS AS NEEDED  . betamethasone dipropionate (DIPROLENE) 0.05 % ointment Apply topically daily as needed.  . budesonide-formoterol (SYMBICORT) 160-4.5 MCG/ACT inhaler Inhale 2 puffs into the lungs 2 (two) times daily.  . busPIRone (BUSPAR) 15 MG tablet TK 1/2 T PO BID (Patient not taking: Reported on 05/22/2020)  . cholecalciferol (VITAMIN D3) 25 MCG (1000 UNIT) tablet Take 1,000 Units by mouth daily.  . citalopram (CELEXA) 20 MG tablet Take 1 tablet (20 mg total) by mouth daily. (Patient not taking: Reported on 05/22/2020)  . IRON, FERROUS SULFATE, PO Take 1 tablet by mouth daily.  Marland Kitchen lactulose (CHRONULAC) 10 GM/15ML solution  (Patient not taking: Reported on 05/22/2020)  . levothyroxine (SYNTHROID) 100 MCG tablet TAKE 1 TABLET(100 MCG) BY MOUTH EVERY DAY  . LORazepam (ATIVAN) 1 MG tablet TAKE 1 TABLET(1 MG) BY MOUTH THREE TIMES DAILY AS NEEDED FOR ANXIETY  . losartan-hydrochlorothiazide (HYZAAR) 100-25 MG tablet TAKE 1 TABLET BY MOUTH DAILY  . meloxicam (MOBIC) 7.5 MG tablet Take 7.5 mg by mouth 2 (two) times daily as needed.  Marland Kitchen oxyCODONE (OXY IR/ROXICODONE) 5 MG immediate release tablet Take 1 tablet (5 mg total) by mouth 2 (two) times daily as needed.  . potassium chloride SA (KLOR-CON) 20 MEQ tablet TAKE 2 TABLETS BY MOUTH TWICE DAILY  . promethazine (PHENERGAN) 25 MG tablet Take 1 tablet (25 mg  total) by mouth every 8 (eight) hours as needed for nausea or vomiting. (Patient not taking: Reported on 05/22/2020)  . Vitamin D, Ergocalciferol, (DRISDOL) 1.25 MG (50000 UNIT) CAPS capsule Take 1 capsule (50,000 Units total) by mouth every 7 (seven) days. (Patient not taking: Reported on 05/22/2020)   No facility-administered encounter medications on file as of 07/21/2020.   Reviewed chart for medication changes ahead of medication coordination call.  No OVs, Consults, or hospital visits since last care coordination call/Pharmacist visit. (If appropriate, list visit date, provider name)  No medication changes indicated OR if recent visit, treatment plan here.  BP Readings from Last 3 Encounters:  04/23/20 132/80  01/16/20 (!) 148/78  10/12/19 (!) 152/78    No results found for: HGBA1C   Patient obtains medications through Vials  90 Days   Last adherence delivery included:  Levothyroxine 162mcg Potassium ER [facility]  Patient declined (meds) last month due to PRN use/additional supply on hand. Albuterol Nebules- has 1 month left- Will call  Albuterol Inhaler- 1 left - Will call Budesonide Inhaler 160/4.5- 1 left-Will call Lorazapam 1MG -PRN Losartan HCTZ 100/25- Refill 90DS on 05/07/20 Meloxicam 7.5MG  PRN Oxycodone 5MG - PRN  Betamethasone 0.05% cream- PRN Vitamin D3 1000 Unit- gets OTC Ferrous Sulfate- gets OTC Multivitamin - gets OTC  Patient is due for next adherence delivery on: 08/01/20. Called patient and reviewed medications and coordinated delivery.  This delivery to include: Levothyroxine 139mcg Potassium ER 20MCG Albuterol Inhaler Losartan/HCTZ 100/25  Patient declined the following medications (meds) due to (reason)  Meloxicam 7.5MG  PRN Oxycodone 5MG - PRN  Betamethasone 0.05% cream- PRN Vitamin D3 1000 Unit- gets OTC Ferrous Sulfate- gets OTC Multivitamin - gets OTC   Patient needs refills for  Potassium 54mcg Losartan/HCTZ  100/25  Confirmed delivery date  of 08/01/20, advised patient that pharmacy will contact them the morning of delivery.  I have filled out form for Donette Larry, CPP to review  Clarita Leber, [place] Pharmacist Assistant [phone]

## 2020-07-22 ENCOUNTER — Other Ambulatory Visit: Payer: Self-pay

## 2020-07-22 DIAGNOSIS — K7460 Unspecified cirrhosis of liver: Secondary | ICD-10-CM

## 2020-07-22 DIAGNOSIS — D696 Thrombocytopenia, unspecified: Secondary | ICD-10-CM

## 2020-07-23 MED ORDER — LOSARTAN POTASSIUM-HCTZ 100-25 MG PO TABS: 1.0000 | ORAL_TABLET | Freq: Every day | ORAL | 0 refills | Status: DC

## 2020-07-23 MED ORDER — POTASSIUM CHLORIDE CRYS ER 20 MEQ PO TBCR
40.0000 meq | EXTENDED_RELEASE_TABLET | Freq: Two times a day (BID) | ORAL | 0 refills | Status: DC
Start: 2020-07-23 — End: 2020-10-22

## 2020-07-30 ENCOUNTER — Other Ambulatory Visit: Payer: Self-pay

## 2020-07-30 ENCOUNTER — Ambulatory Visit (INDEPENDENT_AMBULATORY_CARE_PROVIDER_SITE_OTHER): Payer: PPO | Admitting: Physician Assistant

## 2020-07-30 ENCOUNTER — Encounter: Payer: Self-pay | Admitting: Physician Assistant

## 2020-07-30 VITALS — BP 134/78 | HR 100 | Temp 97.3°F | Resp 18 | Ht 63.0 in | Wt 212.0 lb

## 2020-07-30 DIAGNOSIS — E039 Hypothyroidism, unspecified: Secondary | ICD-10-CM | POA: Diagnosis not present

## 2020-07-30 DIAGNOSIS — E559 Vitamin D deficiency, unspecified: Secondary | ICD-10-CM

## 2020-07-30 DIAGNOSIS — M25561 Pain in right knee: Secondary | ICD-10-CM

## 2020-07-30 DIAGNOSIS — I10 Essential (primary) hypertension: Secondary | ICD-10-CM

## 2020-07-30 DIAGNOSIS — Z8619 Personal history of other infectious and parasitic diseases: Secondary | ICD-10-CM | POA: Diagnosis not present

## 2020-07-30 DIAGNOSIS — J441 Chronic obstructive pulmonary disease with (acute) exacerbation: Secondary | ICD-10-CM

## 2020-07-30 DIAGNOSIS — G8929 Other chronic pain: Secondary | ICD-10-CM | POA: Diagnosis not present

## 2020-07-30 DIAGNOSIS — M25562 Pain in left knee: Secondary | ICD-10-CM

## 2020-07-30 DIAGNOSIS — D696 Thrombocytopenia, unspecified: Secondary | ICD-10-CM

## 2020-07-30 DIAGNOSIS — R059 Cough, unspecified: Secondary | ICD-10-CM | POA: Diagnosis not present

## 2020-07-30 DIAGNOSIS — R0602 Shortness of breath: Secondary | ICD-10-CM | POA: Diagnosis not present

## 2020-07-30 MED ORDER — OXYCODONE HCL 5 MG PO TABS: 5.0000 mg | ORAL_TABLET | Freq: Two times a day (BID) | ORAL | 0 refills | Status: DC | PRN

## 2020-07-30 MED ORDER — LEVOFLOXACIN 500 MG PO TABS: 500.0000 mg | ORAL_TABLET | Freq: Every day | ORAL | 0 refills | Status: DC

## 2020-07-30 MED ORDER — PREDNISONE 20 MG PO TABS: ORAL_TABLET | ORAL | 0 refills | Status: AC

## 2020-07-30 NOTE — Progress Notes (Signed)
Established Patient Office Visit  Subjective:  Patient ID: Natalie Perry, female    DOB: July 21, 1960  Age: 60 y.o. MRN: [id number]  CC:  Chief Complaint  Patient presents with  . Hypertension  . Hypothyroidism    HPI [name] presents for follow up chronic health problems  Pt for follow up of hypertension - she is tolerating medication well - currently on losartan/hctz 100/25 qd - states that she is not having chest pain or edema  Pt with history of asthma/COPD - she recently had acute flare and put on short dose of prednisone and doxycycline - still having cough, congestion and wheezing  Pt with chronic history of pain in knee and back - she is not candidate for surgery - uses mobic 7.5mg  bid and oxycodone as needed- requests refill of pain med today  Pt with history of thrombocytopenia - does follow with hematology regularly  Pt with history of anxiety - she currently takes buspar and ativan which is controlling her symptoms well  Pt taking vit D - is due for labwork  Pt with history of hypothyroidism - currently on synthroid 1108mcg qd - due for labwork  Past Medical History:  Diagnosis Date  . Arthritis   . Asthma   . Bipolar affective disorder ([place])   . Cirrhosis of liver due to hepatitis C   . Thyroid disease     Past Surgical History:  Procedure Laterality Date  . ABDOMINAL HYSTERECTOMY    . ABDOMINAL SURGERY    . APPENDECTOMY    . CHOLECYSTECTOMY      Family History  Problem Relation Age of Onset  . Asthma Mother   . Bipolar disorder Mother   . CAD Father   . Hypertension Father     Social History   Socioeconomic History  . Marital status: Divorced    Spouse name: Not on file  . Number of children: 2  . Years of education: Not on file  . Highest education level: Not on file  Occupational History  . Occupation: disabled  Tobacco Use  . Smoking status: Never Smoker  . Smokeless tobacco: Never Used  Vaping Use  . Vaping Use: Never  used  Substance and Sexual Activity  . Alcohol use: No  . Drug use: Never  . Sexual activity: Not on file  Other Topics Concern  . Not on file  Social History Narrative  . Not on file   Social Determinants of Health   Financial Resource Strain: Not on file  Food Insecurity: No Food Insecurity  . Worried About Charity fundraiser in the Last Year: Never true  . Ran Out of Food in the Last Year: Never true  Transportation Needs: No Transportation Needs  . Lack of Transportation (Medical): No  . Lack of Transportation (Non-Medical): No  Physical Activity: Not on file  Stress: Not on file  Social Connections: Not on file  Intimate Partner Violence: Not on file     Current Outpatient Medications:  .  albuterol (PROVENTIL) (2.5 MG/3ML) 0.083% nebulizer solution, INHALE 1 VIAL VIA NEBULIZER 3 TO 4 TIMES A DAY AS DIRECTED, Disp: 300 mL, Rfl: 3 .  albuterol (VENTOLIN HFA) 108 (90 Base) MCG/ACT inhaler, INHALE 1 TO 2 PUFFS BY MOUTH EVERY 4 TO 6 HOURS AS NEEDED, Disp: 8.5 g, Rfl: 5 .  betamethasone dipropionate (DIPROLENE) 0.05 % ointment, Apply topically daily as needed., Disp: , Rfl:  .  budesonide-formoterol (SYMBICORT) 160-4.5 MCG/ACT inhaler, Inhale 2 puffs  into the lungs 2 (two) times daily., Disp: 1 each, Rfl: 5 .  busPIRone (BUSPAR) 15 MG tablet, , Disp: , Rfl: 1 .  cholecalciferol (VITAMIN D3) 25 MCG (1000 UNIT) tablet, Take 1,000 Units by mouth daily., Disp: , Rfl:  .  citalopram (CELEXA) 20 MG tablet, Take 1 tablet (20 mg total) by mouth daily., Disp: 90 tablet, Rfl: 1 .  IRON, FERROUS SULFATE, PO, Take 1 tablet by mouth daily., Disp: , Rfl:  .  lactulose (CHRONULAC) 10 GM/15ML solution, , Disp: , Rfl:  .  levofloxacin (LEVAQUIN) 500 MG tablet, Take 1 tablet (500 mg total) by mouth daily., Disp: 7 tablet, Rfl: 0 .  levothyroxine (SYNTHROID) 100 MCG tablet, TAKE 1 TABLET(100 MCG) BY MOUTH EVERY DAY, Disp: 30 tablet, Rfl: 3 .  LORazepam (ATIVAN) 1 MG tablet, TAKE 1 TABLET(1 MG) BY  MOUTH THREE TIMES DAILY AS NEEDED FOR ANXIETY, Disp: 90 tablet, Rfl: 0 .  losartan-hydrochlorothiazide (HYZAAR) 100-25 MG tablet, Take 1 tablet by mouth daily., Disp: 90 tablet, Rfl: 0 .  meloxicam (MOBIC) 7.5 MG tablet, Take 7.5 mg by mouth 2 (two) times daily as needed., Disp: , Rfl:  .  potassium chloride SA (KLOR-CON) 20 MEQ tablet, Take 2 tablets (40 mEq total) by mouth 2 (two) times daily., Disp: 360 tablet, Rfl: 0 .  predniSONE (DELTASONE) 20 MG tablet, Take 3 tablets (60 mg total) by mouth daily with breakfast for 3 days, THEN 2 tablets (40 mg total) daily with breakfast for 3 days, THEN 1 tablet (20 mg total) daily with breakfast for 3 days., Disp: 18 tablet, Rfl: 0 .  promethazine (PHENERGAN) 25 MG tablet, Take 1 tablet (25 mg total) by mouth every 8 (eight) hours as needed for nausea or vomiting., Disp: 30 tablet, Rfl: 2 .  Vitamin D, Ergocalciferol, (DRISDOL) 1.25 MG (50000 UNIT) CAPS capsule, Take 1 capsule (50,000 Units total) by mouth every 7 (seven) days., Disp: 12 capsule, Rfl: 1 .  oxyCODONE (OXY IR/ROXICODONE) 5 MG immediate release tablet, Take 1 tablet (5 mg total) by mouth 2 (two) times daily as needed., Disp: 60 tablet, Rfl: 0   Allergies  Allergen Reactions  . Penicillins Hives, Swelling, Rash and Other (See Comments)  . Nicotine     ROS CONSTITUTIONAL: Negative for chills, fatigue, fever, unintentional weight gain and unintentional weight loss.   CARDIOVASCULAR: Negative for chest pain, dizziness, palpitations and pedal edema.  RESPIRATORY: see HPI GASTROINTESTINAL: Negative for abdominal pain, acid reflux symptoms, constipation, diarrhea, nausea and vomiting.  MSK: see HPI INTEGUMENTARY: negative NEUROLOGICAL: Negative for dizziness and headaches.  PSYCHIATRIC: Negative for sleep disturbance and to question depression screen.  Negative for depression, negative for anhedonia.     No visits with results within 1 Day(s) from this visit.  Latest known visit with  results is:  Scanned Document on 07/15/2020  Component Date Value Ref Range Status  . Cologuard 09/11/2018 Negative  Negative Final      Objective:    PHYSICAL EXAM:   VS: BP 134/78 (BP Location: Right Arm, Patient Position: Sitting, Cuff Size: Normal)   Pulse 100   Temp (!) 97.3 F (36.3 C) (Temporal)   Resp 18   Ht 5\' 3"  (1.6 m)   Wt 212 lb (96.2 kg)   SpO2 96%   BMI 37.55 kg/m   PHYSICAL EXAM:   VS: BP 134/78 (BP Location: Right Arm, Patient Position: Sitting, Cuff Size: Normal)   Pulse 100   Temp (!) 97.3 F (36.3 C) (Temporal)  Resp 18   Ht 5\' 3"  (1.6 m)   Wt 212 lb (96.2 kg)   SpO2 96%   BMI 37.55 kg/m   GEN: Well nourished, well developed, in no acute distress  HEENT: normal external ears and nose - - normal nasal mucosa and septum - Lips, Teeth and Gums - normal  Oropharynx - normal mucosa, palate, and posterior pharynx Cardiac: RRR; no murmurs, rubs, or gallops,no edema - no significant varicosities Respiratory:  scattered rhonchi and wheezes noted  MS: no deformity or atrophy however limps and walks with cane and knee brace Skin: warm and dry, no rash  Neuro:  Alert and Oriented x 3, Strength and sensation are intact - CN II-Xii grossly intact Psych: euthymic mood, appropriate affect and demeanor    BP 134/78 (BP Location: Right Arm, Patient Position: Sitting, Cuff Size: Normal)   Pulse 100   Temp (!) 97.3 F (36.3 C) (Temporal)   Resp 18   Ht 5\' 3"  (1.6 m)   Wt 212 lb (96.2 kg)   SpO2 96%   BMI 37.55 kg/m  Wt Readings from Last 3 Encounters:  07/30/20 212 lb (96.2 kg)  04/23/20 208 lb (94.3 kg)  01/16/20 208 lb (94.3 kg)   No visits with results within 1 Day(s) from this visit.  Latest known visit with results is:  Scanned Document on 07/15/2020  Component Date Value Ref Range Status  . Cologuard 09/11/2018 Negative  Negative Final     There are no preventive care reminders to display for this patient.  There are no preventive care  reminders to display for this patient.  Lab Results  Component Value Date   TSH 4.320 01/16/2020   Lab Results  Component Value Date   WBC 6.9 04/23/2020   HGB 13.0 04/23/2020   HCT 37.1 04/23/2020   MCV 88 04/23/2020   PLT 112 (L) 04/23/2020   Lab Results  Component Value Date   NA 137 04/23/2020   K 3.3 (L) 04/23/2020   CO2 22 04/23/2020   GLUCOSE 100 (H) 04/23/2020   BUN 14 04/23/2020   CREATININE 0.72 04/23/2020   BILITOT 1.5 (H) 04/23/2020   ALKPHOS 75 04/23/2020   AST 37 04/23/2020   ALT 23 04/23/2020   PROT 6.6 04/23/2020   ALBUMIN 4.2 04/23/2020   CALCIUM 9.1 04/23/2020   Lab Results  Component Value Date   CHOL 115 01/16/2020   Lab Results  Component Value Date   HDL 45 01/16/2020   Lab Results  Component Value Date   LDLCALC 59 01/16/2020   Lab Results  Component Value Date   TRIG 46 01/16/2020   Lab Results  Component Value Date   CHOLHDL 2.6 01/16/2020   No results found for: HGBA1C    Assessment & Plan:   Problem List Items Addressed This Visit      Cardiovascular and Mediastinum   Benign hypertension - Primary   Relevant Orders   CBC with Differential/Platelet   Comprehensive metabolic panel Continue current meds  Anxiety - continue meds as directed  COPD with exacerbation Chest xray rx for prednisone and levaquin Use her inhalers as directed  Hypothyroidism TSH pending  Chronic knee pain Continue current meds as directed  Thrombocytopenia Continue follow up with hematology      Meds ordered this encounter  Medications  . levofloxacin (LEVAQUIN) 500 MG tablet    Sig: Take 1 tablet (500 mg total) by mouth daily.    Dispense:  7 tablet  Refill:  0    Order Specific Question:   Supervising Provider    AnswerRochel Brome [id number]  . predniSONE (DELTASONE) 20 MG tablet    Sig: Take 3 tablets (60 mg total) by mouth daily with breakfast for 3 days, THEN 2 tablets (40 mg total) daily with breakfast for 3 days, THEN  1 tablet (20 mg total) daily with breakfast for 3 days.    Dispense:  18 tablet    Refill:  0    Order Specific Question:   Supervising Provider    AnswerRochel Brome [id number]  . oxyCODONE (OXY IR/ROXICODONE) 5 MG immediate release tablet    Sig: Take 1 tablet (5 mg total) by mouth 2 (two) times daily as needed.    Dispense:  60 tablet    Refill:  0    Order Specific Question:   Supervising Provider    Answer:   Shelton Silvas    Follow-up: Return in about 3 months (around 10/30/2020) for chronic fasting follow up.    SARA R Jamica Woodyard, PA-C

## 2020-07-31 LAB — CBC WITH DIFFERENTIAL/PLATELET
Basophils Absolute: 0 10*3/uL (ref 0.0–0.2)
Basos: 1 %
EOS (ABSOLUTE): 0.2 10*3/uL (ref 0.0–0.4)
Eos: 3 %
Hematocrit: 38.6 % (ref 34.0–46.6)
Hemoglobin: 13.2 g/dL (ref 11.1–15.9)
Immature Grans (Abs): 0 10*3/uL (ref 0.0–0.1)
Immature Granulocytes: 0 %
Lymphocytes Absolute: 1 10*3/uL (ref 0.7–3.1)
Lymphs: 15 %
MCH: 30.6 pg (ref 26.6–33.0)
MCHC: 34.2 g/dL (ref 31.5–35.7)
MCV: 90 fL (ref 79–97)
Monocytes Absolute: 0.6 10*3/uL (ref 0.1–0.9)
Monocytes: 9 %
Neutrophils Absolute: 4.9 10*3/uL (ref 1.4–7.0)
Neutrophils: 72 %
Platelets: 90 10*3/uL — CL (ref 150–450)
RBC: 4.31 x10E6/uL (ref 3.77–5.28)
RDW: 13.2 % (ref 11.7–15.4)
WBC: 6.8 10*3/uL (ref 3.4–10.8)

## 2020-07-31 LAB — CARDIOVASCULAR RISK ASSESSMENT

## 2020-07-31 LAB — COMPREHENSIVE METABOLIC PANEL
ALT: 23 IU/L (ref 0–32)
AST: 34 IU/L (ref 0–40)
Albumin/Globulin Ratio: 1.6 (ref 1.2–2.2)
Albumin: 3.9 g/dL (ref 3.8–4.9)
Alkaline Phosphatase: 69 IU/L (ref 44–121)
BUN/Creatinine Ratio: 17 (ref 12–28)
BUN: 11 mg/dL (ref 8–27)
Bilirubin Total: 1 mg/dL (ref 0.0–1.2)
CO2: 20 mmol/L (ref 20–29)
Calcium: 8.9 mg/dL (ref 8.7–10.3)
Chloride: 103 mmol/L (ref 96–106)
Creatinine, Ser: 0.63 mg/dL (ref 0.57–1.00)
Globulin, Total: 2.4 g/dL (ref 1.5–4.5)
Glucose: 92 mg/dL (ref 65–99)
Potassium: 3.8 mmol/L (ref 3.5–5.2)
Sodium: 140 mmol/L (ref 134–144)
Total Protein: 6.3 g/dL (ref 6.0–8.5)
eGFR: 101 mL/min/{1.73_m2} (ref >59)

## 2020-07-31 LAB — VITAMIN D 25 HYDROXY (VIT D DEFICIENCY, FRACTURES): Vit D, 25-Hydroxy: 36.5 ng/mL (ref 30.0–100.0)

## 2020-07-31 LAB — LIPID PANEL
Chol/HDL Ratio: 2.7 ratio (ref 0.0–4.4)
Cholesterol, Total: 118 mg/dL (ref 100–199)
HDL: 43 mg/dL (ref >39)
LDL Chol Calc (NIH): 61 mg/dL (ref 0–99)
Triglycerides: 65 mg/dL (ref 0–149)
VLDL Cholesterol Cal: 14 mg/dL (ref 5–40)

## 2020-07-31 LAB — TSH: TSH: 2.97 u[IU]/mL (ref 0.450–4.500)

## 2020-08-01 ENCOUNTER — Other Ambulatory Visit: Payer: Self-pay | Admitting: Hematology and Oncology

## 2020-08-01 ENCOUNTER — Other Ambulatory Visit: Payer: Self-pay

## 2020-08-01 DIAGNOSIS — D696 Thrombocytopenia, unspecified: Secondary | ICD-10-CM

## 2020-08-01 MED ORDER — ALBUTEROL SULFATE HFA 108 (90 BASE) MCG/ACT IN AERS: INHALATION_SPRAY | RESPIRATORY_TRACT | 5 refills | Status: DC

## 2020-08-04 ENCOUNTER — Other Ambulatory Visit: Payer: Self-pay

## 2020-08-04 ENCOUNTER — Telehealth: Payer: Self-pay

## 2020-08-04 MED ORDER — LEVOTHYROXINE SODIUM 100 MCG PO TABS: ORAL_TABLET | ORAL | 3 refills | Status: DC

## 2020-08-04 NOTE — Progress Notes (Signed)
Chronic Care Management Pharmacy Assistant   Name: Natalie Perry  MRN: [id number] DOB: 10-Sep-1960   Reason for Encounter: Adherence review    Medications: Outpatient Encounter Medications as of 08/04/2020  Medication Sig  . albuterol (PROVENTIL) (2.5 MG/3ML) 0.083% nebulizer solution INHALE 1 VIAL VIA NEBULIZER 3 TO 4 TIMES A DAY AS DIRECTED  . albuterol (VENTOLIN HFA) 108 (90 Base) MCG/ACT inhaler INHALE 1 TO 2 PUFFS BY MOUTH EVERY 4 TO 6 HOURS AS NEEDED  . betamethasone dipropionate (DIPROLENE) 0.05 % ointment Apply topically daily as needed.  . budesonide-formoterol (SYMBICORT) 160-4.5 MCG/ACT inhaler Inhale 2 puffs into the lungs 2 (two) times daily.  . busPIRone (BUSPAR) 15 MG tablet   . cholecalciferol (VITAMIN D3) 25 MCG (1000 UNIT) tablet Take 1,000 Units by mouth daily.  . citalopram (CELEXA) 20 MG tablet Take 1 tablet (20 mg total) by mouth daily.  . IRON, FERROUS SULFATE, PO Take 1 tablet by mouth daily.  Marland Kitchen lactulose (CHRONULAC) 10 GM/15ML solution   . levofloxacin (LEVAQUIN) 500 MG tablet Take 1 tablet (500 mg total) by mouth daily.  Marland Kitchen levothyroxine (SYNTHROID) 100 MCG tablet TAKE 1 TABLET(100 MCG) BY MOUTH EVERY DAY  . LORazepam (ATIVAN) 1 MG tablet TAKE 1 TABLET(1 MG) BY MOUTH THREE TIMES DAILY AS NEEDED FOR ANXIETY  . losartan-hydrochlorothiazide (HYZAAR) 100-25 MG tablet Take 1 tablet by mouth daily.  . meloxicam (MOBIC) 7.5 MG tablet Take 7.5 mg by mouth 2 (two) times daily as needed.  Marland Kitchen oxyCODONE (OXY IR/ROXICODONE) 5 MG immediate release tablet Take 1 tablet (5 mg total) by mouth 2 (two) times daily as needed.  . potassium chloride SA (KLOR-CON) 20 MEQ tablet Take 2 tablets (40 mEq total) by mouth 2 (two) times daily.  . predniSONE (DELTASONE) 20 MG tablet Take 3 tablets (60 mg total) by mouth daily with breakfast for 3 days, THEN 2 tablets (40 mg total) daily with breakfast for 3 days, THEN 1 tablet (20 mg total) daily with breakfast for 3 days.  . promethazine  (PHENERGAN) 25 MG tablet Take 1 tablet (25 mg total) by mouth every 8 (eight) hours as needed for nausea or vomiting.  . Vitamin D, Ergocalciferol, (DRISDOL) 1.25 MG (50000 UNIT) CAPS capsule Take 1 capsule (50,000 Units total) by mouth every 7 (seven) days.   No facility-administered encounter medications on file as of 08/04/2020.    Chart review noted the patient has the following appointments: 08/20/20-Sara Owens Shark, [place] 11/05/20-Sara Rosana Hoes, PA-C  Medication adherence Total Gaps - All Measures 3  Total Gaps - Star Measures 0 ? Breast Cancer Screening (BCS) Met: Breast Cancer Screening Performed: Normal Result ? Controlling High Blood Pressure (CBP) 0 ? Diabetes: HbA1c Control <= 9.0% (CDC-A1c) 0 ? Diabetes: Eye exam (retinal) performed (CDC-Eye) 0 ? Diabetes: Medical attention for nephropathy (CDC-Neph) 0 ? Care for Older Adults (COA): Medication Review 0 ? Care for Older Adults (COA): Pain Assessment 0 ? Colorectal Cancer Screening (COL) Met: Colorectal Cancer Screening Compliant ? Medication Adherence for Cholesterol (Statins) (MAC) 0 ? Medication Adherence for Diabetes Medications (MAD) 0 ? Medication Adherence for Hypertension (RAS antogoniststs) [facility]) Met: Med Compliance HTN:  80-89% ? Medication Reconciliation Post-Discharge (MRP) 0 ? Osteoporosis Management in Women Who Had a Fracture (OMW) 0 ? Plan All-Cause Readmissions (PCR) 0 ? Statin Therapy for Patients with Cardiovascular Disease (SPC) 0 ? Statin Use in Persons with Diabetes (SUPD) 0 ? Statin Use in Persons with Diabetes (SUPD) 0 Visits: Wellness [facility]) Not Met: AWV  not performed Visits: Annual Wellness Visit (AWV) Not Met: AWV not performed Controlling High Blood Pressure (CBP) Not Met: BP > 140/90 Diabetes: HbA1c Testing (CDC-A1c Testing) 0 Diabetes: Eye exam (retinal) performed (CDC-Eye) 0 Care for Older Adults (COA): Medication Review 0 Care for Older Adults (COA): Pain Assessment 0 Plan All-Cause  Readmissions (PCR) [place], [place] Pharmacist Assistant [phone]

## 2020-08-07 ENCOUNTER — Other Ambulatory Visit: Payer: Self-pay | Admitting: Oncology

## 2020-08-07 DIAGNOSIS — D696 Thrombocytopenia, unspecified: Secondary | ICD-10-CM

## 2020-08-07 NOTE — Progress Notes (Signed)
Natalie Perry  [address] [place],  [place]  [address] [phone]  Clinic Day:  08/08/2020  Referring physician: [name], PA-C   HISTORY OF PRESENT ILLNESS:  The patient is a 60 y.o. 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  Labs dating back to 2011 have shown her platelet count consistently around 100K.  As she has not been severely thrombocytopenic, her platelets have been followed conservatively.  She comes in today for routine follow-up.  Since her last visit, the patient has been doing fairly well.  She denies having any subcutaneous bruising/bleeding issues which concern her for a declining platelet count.  PHYSICAL EXAM:  Blood pressure (!) 148/77, pulse (!) 102, temperature 98.3 F (36.8 C), resp. rate 16, height 5\' 3"  (1.6 m), weight 210 lb 1.6 oz (95.3 kg), SpO2 96 %. Wt Readings from Last 3 Encounters:  08/08/20 210 lb 1.6 oz (95.3 kg)  07/30/20 212 lb (96.2 kg)  04/23/20 208 lb (94.3 kg)   Body mass index is 37.22 kg/m. Performance status (ECOG): 1 - Symptomatic but completely ambulatory Physical Exam Constitutional:      Appearance: Normal appearance. She is not ill-appearing.  HENT:     Mouth/Throat:     Mouth: Mucous membranes are moist.     Pharynx: Oropharynx is clear. No oropharyngeal exudate or posterior oropharyngeal erythema.  Cardiovascular:     Rate and Rhythm: Normal rate and regular rhythm.     Heart sounds: No murmur heard. No friction rub. No gallop.   Pulmonary:     Effort: Pulmonary effort is normal. No respiratory distress.     Breath sounds: Normal breath sounds. No wheezing, rhonchi or rales.  Chest:  Breasts:     Right: No axillary adenopathy or supraclavicular adenopathy.     Left: No axillary adenopathy or supraclavicular adenopathy.    Abdominal:     General: Bowel sounds are normal. There is no distension.     Palpations: Abdomen is soft.  There is no mass.     Tenderness: There is no abdominal tenderness.  Musculoskeletal:        General: No swelling.     Right lower leg: No edema.     Left lower leg: No edema.  Lymphadenopathy:     Cervical: No cervical adenopathy.     Upper Body:     Right upper body: No supraclavicular or axillary adenopathy.     Left upper body: No supraclavicular or axillary adenopathy.     Lower Body: No right inguinal adenopathy. No left inguinal adenopathy.  Skin:    General: Skin is warm.     Coloration: Skin is not jaundiced.     Findings: No lesion or rash.  Neurological:     General: No focal deficit present.     Mental Status: She is alert and oriented to person, place, and time. Mental status is at baseline.     Cranial Nerves: Cranial nerves are intact.  Psychiatric:        Mood and Affect: Mood normal.        Behavior: Behavior normal.        Thought Content: Thought content normal.    LABS:   CBC Latest Ref Rng & Units 08/08/2020 07/30/2020 04/23/2020  WBC - 6.4 6.8 6.9  Hemoglobin 12.0 - 16.0 13.6 13.2 13.0  Hematocrit 36 - 46 41 38.6 37.1  Platelets 150 - 399 97(A) 90(LL) 112(L)   CMP  Latest Ref Rng & Units 08/08/2020 07/30/2020 04/23/2020  Glucose 65 - 99 mg/dL - 92 100(H)  BUN 4 - 21 11 11 14   Creatinine 0.5 - 1.1 0.5 0.63 0.72  Sodium 137 - 147 136(A) 140 137  Potassium 3.4 - 5.3 3.8 3.8 3.3(L)  Chloride 99 - 108 105 103 99  CO2 13 - 22 24(A) 20 22  Calcium 8.7 - 10.7 8.8 8.9 9.1  Total Protein 6.0 - 8.5 g/dL - 6.3 6.6  Total Bilirubin 0.0 - 1.2 mg/dL - 1.0 1.5(H)  Alkaline Phos 25 - 125 70 69 75  AST 13 - 35 40(A) 34 37  ALT 7 - 35 29 23 23     Ref. Range 08/08/2020 14:03  Iron Latest Ref Range: 28 - 170 ug/dL 100  UIBC Latest Units: ug/dL 233  TIBC Latest Ref Range: 250 - 450 ug/dL 333  Saturation Ratios Latest Ref Range: 10.4 - 31.8 % 30  Ferritin Latest Ref Range: 11 - 307 ng/mL 88    ASSESSMENT & PLAN:  Assessment/Plan:  A 60 y.o. female with thrombocytopenia  due to hepatitis-C-induced cirrhosis and secondary splenomegaly.  Her platelet count of 97 today is very similar to what it has been over numerous years.  As long as her platelet count remains well above 20,000, she should not develop any significant bleeding/bruising issues.  I also made sure the patient understood that her platelet count is good enough for her to undergo any type of surgery, including upcoming right surgery.  As her thrombocytopenia is stable and she is doing well from a hematologic perspective, she will continue to be followed yearly.  The patient understands all the plans discussed today and is in agreement with them.     Dayquan Buys Macarthur Critchley, MD

## 2020-08-08 ENCOUNTER — Inpatient Hospital Stay: Payer: PPO

## 2020-08-08 ENCOUNTER — Other Ambulatory Visit: Payer: Self-pay | Admitting: Hematology and Oncology

## 2020-08-08 ENCOUNTER — Other Ambulatory Visit: Payer: Self-pay | Admitting: Oncology

## 2020-08-08 ENCOUNTER — Inpatient Hospital Stay: Payer: PPO | Attending: Oncology | Admitting: Oncology

## 2020-08-08 ENCOUNTER — Other Ambulatory Visit: Payer: Self-pay

## 2020-08-08 ENCOUNTER — Telehealth: Payer: Self-pay | Admitting: Oncology

## 2020-08-08 VITALS — BP 148/77 | HR 102 | Temp 98.3°F | Resp 16 | Ht 63.0 in | Wt 210.1 lb

## 2020-08-08 DIAGNOSIS — D696 Thrombocytopenia, unspecified: Secondary | ICD-10-CM

## 2020-08-08 DIAGNOSIS — D649 Anemia, unspecified: Secondary | ICD-10-CM | POA: Diagnosis not present

## 2020-08-08 DIAGNOSIS — K7460 Unspecified cirrhosis of liver: Secondary | ICD-10-CM | POA: Insufficient documentation

## 2020-08-08 DIAGNOSIS — D6959 Other secondary thrombocytopenia: Secondary | ICD-10-CM | POA: Insufficient documentation

## 2020-08-08 LAB — CBC AND DIFFERENTIAL
HCT: 41 (ref 36–46)
Hemoglobin: 13.6 (ref 12.0–16.0)
Neutrophils Absolute: 4.67
Platelets: 97 — AB (ref 150–399)
WBC: 6.4

## 2020-08-08 LAB — CBC: RBC: 4.47 (ref 3.87–5.11)

## 2020-08-08 LAB — IRON AND TIBC
Iron: 100 ug/dL (ref 28–170)
Saturation Ratios: 30 % (ref 10.4–31.8)
TIBC: 333 ug/dL (ref 250–450)
UIBC: 233 ug/dL

## 2020-08-08 LAB — BASIC METABOLIC PANEL
BUN: 11 (ref 4–21)
CO2: 24 — AB (ref 13–22)
Chloride: 105 (ref 99–108)
Creatinine: 0.5 (ref 0.5–1.1)
Glucose: 95
Potassium: 3.8 (ref 3.4–5.3)
Sodium: 136 — AB (ref 137–147)

## 2020-08-08 LAB — COMPREHENSIVE METABOLIC PANEL
Albumin: 4.1 (ref 3.5–5.0)
Calcium: 8.8 (ref 8.7–10.7)

## 2020-08-08 LAB — HEPATIC FUNCTION PANEL
ALT: 29 (ref 7–35)
AST: 40 — AB (ref 13–35)
Alkaline Phosphatase: 70 (ref 25–125)
Bilirubin, Total: 1.4

## 2020-08-08 LAB — FERRITIN: Ferritin: 88 ng/mL (ref 11–307)

## 2020-08-08 NOTE — Telephone Encounter (Signed)
Per 4/1 LOS, patient scheduled for March 2023 Appt's.  Gave patient Appt Summary

## 2020-08-19 NOTE — Progress Notes (Incomplete)
Chronic Care Management Pharmacy Note  08/19/2020 Name:  Natalie Perry MRN:  [id number] DOB:  1960-11-10  Subjective: Natalie Perry is an 60 y.o. year old female who is a primary patient of [name], [place].  The CCM team was consulted for assistance with disease management and care coordination needs.    Engaged with patient by telephone for follow up visit in response to provider referral for pharmacy case management and/or care coordination services.   Consent to Services:  The patient was given information about Chronic Care Management services, agreed to services, and gave verbal consent prior to initiation of services.  Please see initial visit note for detailed documentation.   Patient Care Team: [name], PA-C as PCP - General (Physician Assistant) Burnice Logan, [facility] as Pharmacist (Pharmacist)  Recent office visits: *** None since last CCM visit Recent consult visits: *** None since last CCM visit Hospital visits: {Hospital DC Yes/No:25215}  Objective:  Lab Results  Component Value Date   CREATININE 0.5 08/08/2020   BUN 11 08/08/2020   GFRNONAA 91 04/23/2020   GFRAA 105 04/23/2020   NA 136 (A) 08/08/2020   K 3.8 08/08/2020   CALCIUM 8.8 08/08/2020   CO2 24 (A) 08/08/2020   GLUCOSE 92 07/30/2020    No results found for: HGBA1C, FRUCTOSAMINE, GFR, MICROALBUR  Last diabetic Eye exam: No results found for: HMDIABEYEEXA  Last diabetic Foot exam: No results found for: HMDIABFOOTEX   Lab Results  Component Value Date   CHOL 118 07/30/2020   HDL 43 07/30/2020   LDLCALC 61 07/30/2020   TRIG 65 07/30/2020   CHOLHDL 2.7 07/30/2020    Hepatic Function Latest Ref Rng & Units 08/08/2020 07/30/2020 04/23/2020  Total Protein 6.0 - 8.5 g/dL - 6.3 6.6  Albumin 3.5 - 5.0 4.1 3.9 4.2  AST 13 - 35 40(A) 34 37  ALT 7 - 35 _0 Alk Phosphatase 25 - 125 70 69 75  Total Bilirubin 0.0 - 1.2 mg/dL - 1.0 1.5(H)    Lab Results  Component Value Date/Time    TSH 2.970 07/30/2020 10:42 AM   TSH 4.320 01/16/2020 11:05 AM    CBC Latest Ref Rng & Units 08/08/2020 07/30/2020 04/23/2020  WBC - 6.4 6.8 6.9  Hemoglobin 12.0 - 16.0 13.6 13.2 13.0  Hematocrit 36 - 46 41 38.6 37.1  Platelets 150 - 399 97(A) 90(LL) 112(L)    Lab Results  Component Value Date/Time   VD25OH 36.5 07/30/2020 10:42 AM   VD25OH 44.9 01/16/2020 11:05 AM    Clinical ASCVD: No  The ASCVD Risk score ([place]., et al., 2013) failed to calculate for the following reasons:   The valid total cholesterol range is 130 to 320 mg/dL    Depression screen [facility] 2/9 05/22/2020 04/23/2020  Decreased Interest 0 0  Down, Depressed, Hopeless 0 0  PHQ - 2 Score 0 0     ***Other: (CHADS2VASc if Afib, MMRC or CAT for COPD, ACT, DEXA)  Social History   Tobacco Use  Smoking Status Never Smoker  Smokeless Tobacco Never Used   BP Readings from Last 3 Encounters:  08/08/20 (!) 148/77  07/30/20 134/78  04/23/20 132/80   Pulse Readings from Last 3 Encounters:  08/08/20 (!) 102  07/30/20 100  04/23/20 80   Wt Readings from Last 3 Encounters:  08/08/20 210 lb 1.6 oz (95.3 kg)  07/30/20 212 lb (96.2 kg)  04/23/20 208 lb (94.3 kg)   BMI Readings  from Last 3 Encounters:  08/08/20 37.22 kg/m  07/30/20 37.55 kg/m  04/23/20 36.85 kg/m    Assessment/Interventions: Review of patient past medical history, allergies, medications, health status, including review of consultants reports, laboratory and other test data, was performed as part of comprehensive evaluation and provision of chronic care management services.   SDOH:  (Social Determinants of Health) assessments and interventions performed: Yes  SDOH Screenings   Alcohol Screen: Not on file  Depression (PHQ2-9): Low Risk   . PHQ-2 Score: 0  Financial Resource Strain: Not on file  Food Insecurity: No Food Insecurity  . Worried About Charity fundraiser in the Last Year: Never true  . Ran Out of Food in the Last Year: Never  true  Housing: Low Risk   . Last Housing Risk Score: 0  Physical Activity: Not on file  Social Connections: Not on file  Stress: Not on file  Tobacco Use: Low Risk   . Smoking Tobacco Use: Never Smoker  . Smokeless Tobacco Use: Never Used  Transportation Needs: No Transportation Needs  . Lack of Transportation (Medical): No  . Lack of Transportation (Non-Medical): No    CCM Care Plan  Allergies  Allergen Reactions  . Penicillins Hives, Swelling, Rash and Other (See Comments)  . Nicotine     Medications Reviewed Today    Reviewed by Eliot Ford (Physician Assistant) on 07/30/20 at 1149  Med List Status: <None>  Medication Order Taking? Sig Documenting Provider Last Dose Status Informant  albuterol (PROVENTIL) (2.5 MG/3ML) 0.083% nebulizer solution [id number] Yes INHALE 1 VIAL VIA NEBULIZER 3 TO 4 TIMES A DAY AS DIRECTED [name], PA-C Taking Active   albuterol (VENTOLIN HFA) 108 (90 Base) MCG/ACT inhaler [id number] Yes INHALE 1 TO 2 PUFFS BY MOUTH EVERY 4 TO 6 HOURS AS NEEDED [name], PA-C Taking Active   betamethasone dipropionate (DIPROLENE) 0.05 % ointment [id number] Yes Apply topically daily as needed. [provider] Taking Active   budesonide-formoterol (SYMBICORT) 160-4.5 MCG/ACT inhaler [id number] Yes Inhale 2 puffs into the lungs 2 (two) times daily. [name], PA-C Taking Active   busPIRone (BUSPAR) 15 MG tablet [id number] Yes  [provider] Taking Active   cholecalciferol (VITAMIN D3) 25 MCG (1000 UNIT) tablet [id number] Yes Take 1,000 Units by mouth daily. [provider] Taking Active   citalopram (CELEXA) 20 MG tablet [id number] Yes Take 1 tablet (20 mg total) by mouth daily. [name], PA-C Taking Active   IRON, FERROUS SULFATE, PO [id number] Yes Take 1 tablet by mouth daily. [provider] Taking Active   lactulose (CHRONULAC) 10 GM/15ML solution [id number] Yes  [provider] Taking Active   levofloxacin  (LEVAQUIN) 500 MG tablet [id number] Yes Take 1 tablet (500 mg total) by mouth daily. [name], PA-C  Active   levothyroxine (SYNTHROID) 100 MCG tablet [id number] Yes TAKE 1 TABLET(100 MCG) BY MOUTH EVERY DAY Cox, Kirsten, MD Taking Active   LORazepam (ATIVAN) 1 MG tablet [id number] Yes TAKE 1 TABLET(1 MG) BY MOUTH THREE TIMES DAILY AS NEEDED FOR ANXIETY [name], PA-C Taking Active   losartan-hydrochlorothiazide (HYZAAR) 100-25 MG tablet [id number] Yes Take 1 tablet by mouth daily. [name], PA-C Taking Active   meloxicam (MOBIC) 7.5 MG tablet [id number] Yes Take 7.5 mg by mouth 2 (two) times daily as needed. [provider] Taking Active   oxyCODONE (OXY IR/ROXICODONE) 5 MG immediate release tablet [id number]  Take 1 tablet (5 mg total) by mouth 2 (two) times daily as needed.  [name], PA-C  Active   potassium chloride SA (KLOR-CON) 20 MEQ tablet [id number] Yes Take 2 tablets (40 mEq total) by mouth 2 (two) times daily. [name], PA-C Taking Active   predniSONE (DELTASONE) 20 MG tablet [id number] Yes Take 3 tablets (60 mg total) by mouth daily with breakfast for 3 days, THEN 2 tablets (40 mg total) daily with breakfast for 3 days, THEN 1 tablet (20 mg total) daily with breakfast for 3 days. [name], PA-C  Active   promethazine (PHENERGAN) 25 MG tablet [id number] Yes Take 1 tablet (25 mg total) by mouth every 8 (eight) hours as needed for nausea or vomiting. [name], PA-C Taking Active   Vitamin D, Ergocalciferol, (DRISDOL) 1.25 MG (50000 UNIT) CAPS capsule [id number] Yes Take 1 capsule (50,000 Units total) by mouth every 7 (seven) days. [name], PA-C Taking Active           Patient Active Problem List   Diagnosis Date Noted  . COPD with acute exacerbation ([place]) 07/30/2020  . Chronic pain of both knees 07/30/2020  . Visit for screening mammogram 04/23/2020  . Polyuria 01/16/2020  . Need for prophylactic vaccination and inoculation against influenza 01/16/2020  . Need  for prophylactic vaccination against Streptococcus pneumoniae (pneumococcus) 01/16/2020  . Moderate persistent asthma without complication 23/76/2831  . Herpes zoster without complication 51/76/1607  . Vitamin D deficiency 07/02/2019  . Thrombocytopenia ([place]) 07/05/2016  . History of hepatitis C 07/05/2016  . Hypothyroid 10/17/2012  . Asthma 10/17/2012  . GERD (gastroesophageal reflux disease) 10/17/2012  . Chronic hepatitis C virus infection ([place]) 10/16/2012  . ALLERGIC RHINITIS 07/31/2008  . PULMONARY NODULE 06/21/2007  . DYSPNEA 06/06/2007  . COUGH 06/06/2007  . Benign hypertension 05/25/2007  . ASTHMA 05/25/2007    Immunization History  Administered Date(s) Administered  . Influenza Inj Mdck Quad Pf 01/16/2020  . Influenza-Unspecified 02/08/2019  . PFIZER(Purple Top)SARS-COV-2 Vaccination 08/10/2019, 08/31/2019, 06/26/2020  . Pneumococcal Conjugate-13 03/18/2014  . Pneumococcal Polysaccharide-23 01/16/2020  . Pneumococcal-Unspecified 04/06/2011  . Tdap 01/31/2012    Conditions to be addressed/monitored:  Hypertension and Asthma  There are no care plans that you recently modified to display for this patient.    Medication Assistance: None required.  Patient affirms current coverage meets needs.  Patient's preferred pharmacy is:  [facility] - [place], [place] - [address] [place] [address] Phone: [phone] Fax: [phone]  [facility] [place], [place] - [address] AT [place] [address] [place] [address] Phone: [phone] Fax: [phone]  Uses pill box? {Yes or If no, why not?:20788} Pt endorses ***% compliance  We discussed: Benefits of medication synchronization, packaging and delivery as well as enhanced pharmacist oversight with [facility]. Patient decided to: Utilize [facility] pharmacy for medication synchronization, packaging and  delivery  Care Plan and Follow Up Patient Decision:  Patient agrees to Care Plan and Follow-up.  Plan: Telephone follow up appointment with care management team member scheduled for:  ***  ***    Current Barriers:  . {pharmacybarriers:24917} . ***  Pharmacist Clinical Goal(s):  Marland Kitchen Patient will {PHARMACYGOALCHOICES:24921} through collaboration with PharmD and provider.  . ***  Interventions: . 1:1 collaboration with [name], PA-C regarding development and update of comprehensive plan of care as evidenced by provider attestation and co-signature . Inter-disciplinary care team collaboration (see longitudinal plan of care) . Comprehensive medication review performed; medication list updated in electronic medical  record  Hypertension (BP goal {CHL HP [facility] Pharmacist BP ranges:[phone]}) -{US controlled/uncontrolled:25276} -Current treatment: . *** -Medications previously tried: ***  -Current home readings: *** -Current dietary habits: *** -Current exercise habits: *** -{ACTIONS;DENIES/REPORTS:[id number]::"Denies"} hypotensive/hypertensive symptoms -Educated on {CCM BP Counseling:25124} -Counseled to monitor BP at home ***, document, and provide log at future appointments -{CCMPHARMDINTERVENTION:25122}  COPD (Goal: control symptoms and prevent exacerbations) -{US controlled/uncontrolled:25276} -Current treatment  . *** -Medications previously tried: ***  -Gold Grade: {CHL HP [facility] COPD Gold NGBMB:[phone]} -Current COPD Classification:  {CHL HP [facility] COPD [phone]} -MMRC/CAT score: *** -Pulmonary function testing: *** -Exacerbations requiring treatment in last 6 months: *** -Patient {Actions; denies-reports:120008} consistent use of maintenance inhaler -Frequency of rescue inhaler use: *** -Counseled on {CCMINHALERCOUNSELING:25121} -{CCMPHARMDINTERVENTION:25122}  Depression/Anxiety (Goal: ***) -{US  controlled/uncontrolled:25276} -Current treatment: . *** -Medications previously tried/failed: *** -PHQ9: *** -GAD7: *** -Connected with *** for mental health support -Educated on {CCM mental health counseling:25127} -{CCMPHARMDINTERVENTION:25122}   Patient Goals/Self-Care Activities . Patient will:  - {pharmacypatientgoals:24919}  Follow Up Plan: {CM FOLLOW UP EVQW:03794}

## 2020-08-20 ENCOUNTER — Telehealth: Payer: PPO

## 2020-08-21 ENCOUNTER — Telehealth: Payer: Self-pay

## 2020-08-21 NOTE — Progress Notes (Signed)
    Chronic Care Management Pharmacy Assistant   Name: Natalie Perry  MRN: [id number] DOB: Oct 02, 1960   Reason for Encounter: Medication Coordination for [facility] delivery  Recent office visits:  08/08/20-Dr. Bobby Perry Oncology- return 1 year  07/30/20-Natalie Perry- hypertension, start Prednisone dose pack, start Levofloxacin, completed Doxycycline. Follow up 36mos  Recent consult visits:  none  Hospital visits:  None in previous 6 months  Medications: Outpatient Encounter Medications as of 08/21/2020  Medication Sig  . albuterol (PROVENTIL) (2.5 MG/3ML) 0.083% nebulizer solution INHALE 1 VIAL VIA NEBULIZER 3 TO 4 TIMES A DAY AS DIRECTED  . albuterol (VENTOLIN HFA) 108 (90 Base) MCG/ACT inhaler INHALE 1 TO 2 PUFFS BY MOUTH EVERY 4 TO 6 HOURS AS NEEDED  . betamethasone dipropionate (DIPROLENE) 0.05 % ointment Apply topically daily as needed.  . budesonide-formoterol (SYMBICORT) 160-4.5 MCG/ACT inhaler Inhale 2 puffs into the lungs 2 (two) times daily.  . busPIRone (BUSPAR) 15 MG tablet   . cholecalciferol (VITAMIN D3) 25 MCG (1000 UNIT) tablet Take 1,000 Units by mouth daily.  . citalopram (CELEXA) 20 MG tablet Take 1 tablet (20 mg total) by mouth daily.  . IRON, FERROUS SULFATE, PO Take 1 tablet by mouth daily.  Marland Kitchen lactulose (CHRONULAC) 10 GM/15ML solution   . levofloxacin (LEVAQUIN) 500 MG tablet Take 1 tablet (500 mg total) by mouth daily.  Marland Kitchen levothyroxine (SYNTHROID) 100 MCG tablet TAKE 1 TABLET(100 MCG) BY MOUTH EVERY DAY  . LORazepam (ATIVAN) 1 MG tablet TAKE 1 TABLET(1 MG) BY MOUTH THREE TIMES DAILY AS NEEDED FOR ANXIETY  . losartan-hydrochlorothiazide (HYZAAR) 100-25 MG tablet Take 1 tablet by mouth daily.  . meloxicam (MOBIC) 7.5 MG tablet Take 7.5 mg by mouth 2 (two) times daily as needed.  Marland Kitchen oxyCODONE (OXY IR/ROXICODONE) 5 MG immediate release tablet Take 1 tablet (5 mg total) by mouth 2 (two) times daily as needed.  . potassium chloride SA (KLOR-CON) 20 MEQ tablet Take 2  tablets (40 mEq total) by mouth 2 (two) times daily.  . promethazine (PHENERGAN) 25 MG tablet Take 1 tablet (25 mg total) by mouth every 8 (eight) hours as needed for nausea or vomiting.  . Vitamin D, Ergocalciferol, (DRISDOL) 1.25 MG (50000 UNIT) CAPS capsule Take 1 capsule (50,000 Units total) by mouth every 7 (seven) days.   No facility-administered encounter medications on file as of 08/21/2020.    Reviewed chart for medication changes ahead of medication coordination call.  No OVs, Consults, or hospital visits since last care coordination call/Pharmacist visit. (If appropriate, list visit date, provider name)  No medication changes indicated OR if recent visit, treatment plan here.  BP Readings from Last 3 Encounters:  08/08/20 (!) 148/77  07/30/20 134/78  04/23/20 132/80    No results found for: HGBA1C   Patient obtains medications through Vials  90 Days   Last adherence delivery included:  Levothyroxine 13mcg Potassium ER [facility]  Patient declined (meds) last month due to PRN use/additional supply on hand. Albuterol Nebules- has 1 month left- Will call  Albuterol Inhaler- 1 left - Will call Budesonide Inhaler 160/4.5- 1 left-Will call Lorazapam 1MG -PRN Losartan HCTZ 100/25- Refill 90DS on 05/07/20 Meloxicam 7.5MG  PRN Oxycodone 5MG - PRN  Betamethasone 0.05% cream- PRN Vitamin D3 1000 Unit- gets OTC Ferrous Sulfate- gets OTC Multivitamin - gets OTC   Unable to reach patient, multiple attempts made.  She received 90day supply from [facility]  on 07/31/20  Natalie Perry, Natalie Perry Pharmacist Assistant [phone]

## 2020-09-02 ENCOUNTER — Other Ambulatory Visit: Payer: Self-pay | Admitting: Physician Assistant

## 2020-09-02 DIAGNOSIS — K7460 Unspecified cirrhosis of liver: Secondary | ICD-10-CM

## 2020-09-02 DIAGNOSIS — D696 Thrombocytopenia, unspecified: Secondary | ICD-10-CM

## 2020-09-21 ENCOUNTER — Other Ambulatory Visit: Payer: Self-pay | Admitting: Physician Assistant

## 2020-10-08 DIAGNOSIS — I2699 Other pulmonary embolism without acute cor pulmonale: Secondary | ICD-10-CM

## 2020-10-08 HISTORY — DX: Other pulmonary embolism without acute cor pulmonale: I26.99

## 2020-10-14 ENCOUNTER — Telehealth: Payer: Self-pay

## 2020-10-14 NOTE — Telephone Encounter (Signed)
I left a message on the number(s) listed in the patients chart requesting the patient to call back regarding the [place] appointment for 11/05/2020. The provider is out of the office that day. The appointment has been canceled. Waiting for the patient to return the call.

## 2020-10-16 DIAGNOSIS — Z20822 Contact with and (suspected) exposure to covid-19: Secondary | ICD-10-CM | POA: Diagnosis not present

## 2020-10-16 DIAGNOSIS — R112 Nausea with vomiting, unspecified: Secondary | ICD-10-CM | POA: Diagnosis not present

## 2020-10-21 ENCOUNTER — Telehealth: Payer: Self-pay

## 2020-10-21 NOTE — Progress Notes (Signed)
Chronic Care Management Pharmacy Assistant   Name: Natalie Perry  MRN: [id number] DOB: 1960/09/08  Reason for Encounter: Medication coordination for [facility]   Recent office visits:  08/08/20-Dr. Bobby Perry Oncology- return 1 year   07/30/20-Natalie Perry- hypertension, start Prednisone dose pack, start Levofloxacin, completed Doxycycline. Follow up 14mos  Recent consult visits:  none  Hospital visits:  None in previous 6 months  Medications: Outpatient Encounter Medications as of 10/21/2020  Medication Sig   albuterol (PROVENTIL) (2.5 MG/3ML) 0.083% nebulizer solution INHALE 1 VIAL VIA NEBULIZER 3 TO 4 TIMES A DAY AS DIRECTED   albuterol (VENTOLIN HFA) 108 (90 Base) MCG/ACT inhaler INHALE 1 TO 2 PUFFS BY MOUTH EVERY 4 TO 6 HOURS AS NEEDED   betamethasone dipropionate (DIPROLENE) 0.05 % ointment Apply topically daily as needed.   budesonide-formoterol (SYMBICORT) 160-4.5 MCG/ACT inhaler Inhale 2 puffs into the lungs 2 (two) times daily.   busPIRone (BUSPAR) 15 MG tablet    cholecalciferol (VITAMIN D3) 25 MCG (1000 UNIT) tablet Take 1,000 Units by mouth daily.   citalopram (CELEXA) 20 MG tablet Take 1 tablet (20 mg total) by mouth daily.   IRON, FERROUS SULFATE, PO Take 1 tablet by mouth daily.   lactulose (CHRONULAC) 10 GM/15ML solution    levofloxacin (LEVAQUIN) 500 MG tablet Take 1 tablet (500 mg total) by mouth daily.   levothyroxine (SYNTHROID) 100 MCG tablet TAKE 1 TABLET(100 MCG) BY MOUTH EVERY DAY   LORazepam (ATIVAN) 1 MG tablet TAKE 1 TABLET(1 MG) BY MOUTH THREE TIMES DAILY AS NEEDED FOR ANXIETY   losartan-hydrochlorothiazide (HYZAAR) 100-25 MG tablet Take 1 tablet by mouth daily.   meloxicam (MOBIC) 7.5 MG tablet Take 7.5 mg by mouth 2 (two) times daily as needed.   oxyCODONE (OXY IR/ROXICODONE) 5 MG immediate release tablet Take 1 tablet (5 mg total) by mouth 2 (two) times daily as needed.   potassium chloride SA (KLOR-CON) 20 MEQ tablet Take 2 tablets (40 mEq total) by  mouth 2 (two) times daily.   promethazine (PHENERGAN) 25 MG tablet Take 1 tablet (25 mg total) by mouth every 8 (eight) hours as needed for nausea or vomiting.   Vitamin D, Ergocalciferol, (DRISDOL) 1.25 MG (50000 UNIT) CAPS capsule Take 1 capsule (50,000 Units total) by mouth every 7 (seven) days.   No facility-administered encounter medications on file as of 10/21/2020.    Reviewed chart for medication changes ahead of medication coordination call.  No OVs, Consults, or hospital visits since last care coordination call/Pharmacist visit. (If appropriate, list visit date, provider name)  No medication changes indicated OR if recent visit, treatment plan here.  BP Readings from Last 3 Encounters:  08/08/20 (!) 148/77  07/30/20 134/78  04/23/20 132/80    No results found for: HGBA1C   Patient obtains medications through Vials  90 Days   Last adherence delivery included:  Levothyroxine 155mcg-one daily Potassium ER 20MCG- one tab twice daily  Patient declined (meds) last month due to PRN use/additional supply on hand. Albuterol Nebules- has 1 month left- Will call Albuterol Inhaler- 1 left - Will call Budesonide Inhaler 160/4.5- 1 left-Will call Lorazapam 1MG -PRN Losartan HCTZ 100/25- Refill 90DS on 05/07/20 Meloxicam 7.5MG  PRN Oxycodone 5MG - PRN Betamethasone 0.05% cream- PRN Vitamin D3 1000 Unit- gets OTC Ferrous Sulfate- gets OTC Multivitamin - gets OTC  Patient is due for next adherence delivery on:10/30/20. Called patient and reviewed medications and coordinated delivery.  This delivery to include: Levothyroxine 143mcg-one daily Potassium ER 20MCG- one tab twice  daily Losartan/HCTZ  100-25-one daily [id number]   Patient declined the following medications (meds) due to (reason) Vitamin D3 1000 Unit- gets OTC Ferrous Sulfate- gets OTC Multivitamin - gets OTC Meloxicam 7.5MG  PRN Symbicort-2 puffs twice a day- has enough will call  Albuterol Inhaler- has on hand,  will call  Nebules- has on hand will call Lorazapam 1MG -PRN Meloxicam 7.5MG  PRN Oxycodone 5MG - PRN Betamethasone 0.05% cream- PRN  Patient needs refills for . Levothyroxine 143mcg-one daily Potassium ER 20MCG- one tab twice daily Losartan/HCTZ  100-25-one daily   Confirmed delivery date of 10/30/20, advised patient that pharmacy will contact them the morning of delivery.  Natalie Perry, [place] Pharmacist Assistant [phone]

## 2020-10-22 ENCOUNTER — Other Ambulatory Visit: Payer: Self-pay

## 2020-10-22 DIAGNOSIS — D696 Thrombocytopenia, unspecified: Secondary | ICD-10-CM

## 2020-10-22 DIAGNOSIS — K7460 Unspecified cirrhosis of liver: Secondary | ICD-10-CM

## 2020-10-22 MED ORDER — LOSARTAN POTASSIUM-HCTZ 100-25 MG PO TABS: 1.0000 | ORAL_TABLET | Freq: Every day | ORAL | 0 refills | Status: DC

## 2020-10-22 MED ORDER — LEVOTHYROXINE SODIUM 100 MCG PO TABS: ORAL_TABLET | ORAL | 0 refills | Status: DC

## 2020-10-22 MED ORDER — POTASSIUM CHLORIDE CRYS ER 20 MEQ PO TBCR: 40.0000 meq | EXTENDED_RELEASE_TABLET | Freq: Two times a day (BID) | ORAL | 0 refills | Status: DC

## 2020-10-25 ENCOUNTER — Other Ambulatory Visit: Payer: Self-pay | Admitting: Physician Assistant

## 2020-10-31 ENCOUNTER — Ambulatory Visit (INDEPENDENT_AMBULATORY_CARE_PROVIDER_SITE_OTHER): Payer: PPO | Admitting: Physician Assistant

## 2020-10-31 ENCOUNTER — Encounter: Payer: Self-pay | Admitting: Physician Assistant

## 2020-10-31 ENCOUNTER — Other Ambulatory Visit: Payer: Self-pay

## 2020-10-31 VITALS — BP 128/78 | HR 111 | Temp 97.8°F | Resp 20 | Ht 63.0 in | Wt 210.0 lb

## 2020-10-31 DIAGNOSIS — J441 Chronic obstructive pulmonary disease with (acute) exacerbation: Secondary | ICD-10-CM | POA: Diagnosis not present

## 2020-10-31 DIAGNOSIS — R911 Solitary pulmonary nodule: Secondary | ICD-10-CM | POA: Diagnosis not present

## 2020-10-31 DIAGNOSIS — M79661 Pain in right lower leg: Secondary | ICD-10-CM | POA: Diagnosis not present

## 2020-10-31 DIAGNOSIS — E860 Dehydration: Secondary | ICD-10-CM | POA: Diagnosis not present

## 2020-10-31 DIAGNOSIS — C3432 Malignant neoplasm of lower lobe, left bronchus or lung: Secondary | ICD-10-CM | POA: Diagnosis not present

## 2020-10-31 DIAGNOSIS — E039 Hypothyroidism, unspecified: Secondary | ICD-10-CM | POA: Diagnosis not present

## 2020-10-31 DIAGNOSIS — R0902 Hypoxemia: Secondary | ICD-10-CM | POA: Diagnosis not present

## 2020-10-31 DIAGNOSIS — E876 Hypokalemia: Secondary | ICD-10-CM | POA: Diagnosis not present

## 2020-10-31 DIAGNOSIS — I10 Essential (primary) hypertension: Secondary | ICD-10-CM | POA: Diagnosis not present

## 2020-10-31 DIAGNOSIS — J4540 Moderate persistent asthma, uncomplicated: Secondary | ICD-10-CM | POA: Diagnosis not present

## 2020-10-31 DIAGNOSIS — J45901 Unspecified asthma with (acute) exacerbation: Secondary | ICD-10-CM | POA: Diagnosis not present

## 2020-10-31 DIAGNOSIS — Z79899 Other long term (current) drug therapy: Secondary | ICD-10-CM | POA: Diagnosis not present

## 2020-10-31 DIAGNOSIS — F411 Generalized anxiety disorder: Secondary | ICD-10-CM | POA: Diagnosis not present

## 2020-10-31 DIAGNOSIS — I2694 Multiple subsegmental pulmonary emboli without acute cor pulmonale: Secondary | ICD-10-CM | POA: Diagnosis not present

## 2020-10-31 DIAGNOSIS — I2699 Other pulmonary embolism without acute cor pulmonale: Secondary | ICD-10-CM | POA: Diagnosis not present

## 2020-10-31 DIAGNOSIS — Z7901 Long term (current) use of anticoagulants: Secondary | ICD-10-CM | POA: Diagnosis not present

## 2020-10-31 DIAGNOSIS — D688 Other specified coagulation defects: Secondary | ICD-10-CM | POA: Diagnosis not present

## 2020-10-31 DIAGNOSIS — Z8619 Personal history of other infectious and parasitic diseases: Secondary | ICD-10-CM | POA: Diagnosis not present

## 2020-10-31 DIAGNOSIS — G8929 Other chronic pain: Secondary | ICD-10-CM | POA: Diagnosis not present

## 2020-10-31 DIAGNOSIS — M1990 Unspecified osteoarthritis, unspecified site: Secondary | ICD-10-CM | POA: Diagnosis not present

## 2020-10-31 DIAGNOSIS — Z87891 Personal history of nicotine dependence: Secondary | ICD-10-CM | POA: Diagnosis not present

## 2020-10-31 DIAGNOSIS — Z9981 Dependence on supplemental oxygen: Secondary | ICD-10-CM | POA: Diagnosis not present

## 2020-10-31 DIAGNOSIS — R0602 Shortness of breath: Secondary | ICD-10-CM | POA: Diagnosis not present

## 2020-10-31 DIAGNOSIS — Z79891 Long term (current) use of opiate analgesic: Secondary | ICD-10-CM | POA: Diagnosis not present

## 2020-10-31 DIAGNOSIS — M7989 Other specified soft tissue disorders: Secondary | ICD-10-CM | POA: Diagnosis not present

## 2020-10-31 NOTE — Progress Notes (Signed)
Acute Office Visit  Subjective:    Patient ID: Natalie Perry, female    DOB: [birth year], 60 y.o.   MRN: [id number]  Chief Complaint  Patient presents with   Shortness of Breath    HPI Patient is in today for shortness of breath - pt states 3 weeks ago she started with symptoms of cough, congestion and trouble breathing - went to Urgent Care but was not treated with any medications - did have COVID testing which was negative at that time She states she seemed to improve until last night and she began having some shortness of breath -she does have known asthma and states she feels congested and has been wheezing -  HOWEVER SHE HAS NOT BEEN USING HER INHALERS/NEBULIZER AT HOME She did driver herself here to office today  Past Medical History:  Diagnosis Date   Arthritis    Asthma    Bipolar affective disorder ([place])    Cirrhosis of liver due to hepatitis C    Thyroid disease     Past Surgical History:  Procedure Laterality Date   ABDOMINAL HYSTERECTOMY     ABDOMINAL SURGERY     APPENDECTOMY     CHOLECYSTECTOMY      Family History  Problem Relation Age of Onset   Asthma Mother    Bipolar disorder Mother    CAD Father    Hypertension Father     Social History   Socioeconomic History   Marital status: Divorced    Spouse name: Not on file   Number of children: 2   Years of education: Not on file   Highest education level: Not on file  Occupational History   Occupation: disabled  Tobacco Use   Smoking status: Never   Smokeless tobacco: Never  Vaping Use   Vaping Use: Never used  Substance and Sexual Activity   Alcohol use: No   Drug use: Never   Sexual activity: Not on file  Other Topics Concern   Not on file  Social History Narrative   Not on file   Social Determinants of Health   Financial Resource Strain: Not on file  Food Insecurity: No Food Insecurity   Worried About Running Out of Food in the Last Year: Never true   [place] in the Last  Year: Never true  Transportation Needs: No Transportation Needs   Lack of Transportation (Medical): No   Lack of Transportation (Non-Medical): No  Physical Activity: Not on file  Stress: Not on file  Social Connections: Not on file  Intimate Partner Violence: Not on file    Outpatient Medications Prior to Visit  Medication Sig Dispense Refill   albuterol (PROVENTIL) (2.5 MG/3ML) 0.083% nebulizer solution INHALE 1 VIAL VIA NEBULIZER 3 TO 4 TIMES A DAY AS DIRECTED 300 mL 3   albuterol (VENTOLIN HFA) 108 (90 Base) MCG/ACT inhaler INHALE 1 TO 2 PUFFS BY MOUTH EVERY 4 TO 6 HOURS AS NEEDED 8.5 g 5   betamethasone dipropionate (DIPROLENE) 0.05 % ointment Apply topically daily as needed.     budesonide-formoterol (SYMBICORT) 160-4.5 MCG/ACT inhaler Inhale 2 puffs into the lungs 2 (two) times daily. 1 each 5   busPIRone (BUSPAR) 15 MG tablet   1   cholecalciferol (VITAMIN D3) 25 MCG (1000 UNIT) tablet Take 1,000 Units by mouth daily.     citalopram (CELEXA) 20 MG tablet Take 1 tablet (20 mg total) by mouth daily. 90 tablet 1   IRON, FERROUS SULFATE, PO Take 1  tablet by mouth daily.     lactulose (CHRONULAC) 10 GM/15ML solution      levofloxacin (LEVAQUIN) 500 MG tablet Take 1 tablet (500 mg total) by mouth daily. 7 tablet 0   levothyroxine (SYNTHROID) 100 MCG tablet TAKE 1 TABLET(100 MCG) BY MOUTH EVERY DAY 90 tablet 0   LORazepam (ATIVAN) 1 MG tablet TAKE 1 TABLET(1 MG) BY MOUTH THREE TIMES DAILY AS NEEDED FOR ANXIETY 90 tablet 1   losartan-hydrochlorothiazide (HYZAAR) 100-25 MG tablet TAKE 1 TABLET BY MOUTH DAILY 90 tablet 0   meloxicam (MOBIC) 7.5 MG tablet Take 7.5 mg by mouth 2 (two) times daily as needed.     oxyCODONE (OXY IR/ROXICODONE) 5 MG immediate release tablet Take 1 tablet (5 mg total) by mouth 2 (two) times daily as needed. 60 tablet 0   potassium chloride SA (KLOR-CON) 20 MEQ tablet Take 2 tablets (40 mEq total) by mouth 2 (two) times daily. 360 tablet 0   promethazine (PHENERGAN)  25 MG tablet Take 1 tablet (25 mg total) by mouth every 8 (eight) hours as needed for nausea or vomiting. 30 tablet 2   Vitamin D, Ergocalciferol, (DRISDOL) 1.25 MG (50000 UNIT) CAPS capsule Take 1 capsule (50,000 Units total) by mouth every 7 (seven) days. 12 capsule 1   No facility-administered medications prior to visit.    Allergies  Allergen Reactions   Penicillins Hives, Swelling, Rash and Other (See Comments)   Nicotine     Review of Systems CONSTITUTIONAL: denies fever - has had some fatigue E/N/T: Negative for ear pain, nasal congestion and sore throat.  CARDIOVASCULAR: Negative for chest pain, dizziness, palpitations and pedal edema.  RESPIRATORY: see HPI GASTROINTESTINAL: Negative for abdominal pain, acid reflux symptoms, constipation, diarrhea, nausea and vomiting.  INTEGUMENTARY: Negative for rash.          Objective:    Physical Exam  BP 128/78   Pulse (!) 111   Temp 97.8 F (36.6 C)   Resp 20   Ht _0  (1.6 m)   Wt 210 lb (95.3 kg)   SpO2 95%   BMI 37.20 kg/m  Wt Readings from Last 3 Encounters:  10/31/20 210 lb (95.3 kg)  08/08/20 210 lb 1.6 oz (95.3 kg)  07/30/20 212 lb (96.2 kg)  PHYSICAL EXAM:   VS: BP 128/78   Pulse (!) 111   Temp 97.8 F (36.6 C)   Resp 20   Ht _1  (1.6 m)   Wt 210 lb (95.3 kg)   SpO2 95%   BMI 37.20 kg/m   GEN: Well nourished, well developed, - is tachypneic in office HEENT: normal external ears and nose - normal external auditory canals and TMS -  - Lips, Teeth and Gums - normal  Oropharynx - normal mucosa, palate, and posterior pharynx Cardiac: RRR; no murmurs, rubs, or gallops,no edema -  Respiratory:  very tight breath sounds with scattered wheezes Skin: warm and dry, no rash  Psych: euthymic mood, appropriate affect and demeanor  Duoneb given with no improvement of symptoms Pulse ox range from 87-97% Health Maintenance Due  Topic Date Due   Pneumococcal Vaccine 41-88 Years old (1 - PCV) Never done    Zoster Vaccines- Shingrix (1 of 2) Never done   COVID-19 Vaccine (4 - Booster for Pfizer series) 09/23/2020    There are no preventive care reminders to display for this patient.   Lab Results  Component Value Date   TSH 2.970 07/30/2020   Lab Results  Component Value  Date   WBC 6.4 08/08/2020   HGB 13.6 08/08/2020   HCT 41 08/08/2020   MCV 90 07/30/2020   PLT 97 (A) 08/08/2020   Lab Results  Component Value Date   NA 136 (A) 08/08/2020   K 3.8 08/08/2020   CO2 24 (A) 08/08/2020   GLUCOSE 92 07/30/2020   BUN 11 08/08/2020   CREATININE 0.5 08/08/2020   BILITOT 1.0 07/30/2020   ALKPHOS 70 08/08/2020   AST 40 (A) 08/08/2020   ALT 29 08/08/2020   PROT 6.3 07/30/2020   ALBUMIN 4.1 08/08/2020   CALCIUM 8.8 08/08/2020   EGFR 101 07/30/2020   Lab Results  Component Value Date   CHOL 118 07/30/2020   Lab Results  Component Value Date   HDL 43 07/30/2020   Lab Results  Component Value Date   LDLCALC 61 07/30/2020   Lab Results  Component Value Date   TRIG 65 07/30/2020   Lab Results  Component Value Date   CHOLHDL 2.7 07/30/2020   No results found for: HGBA1C     Assessment & Plan:  1. Moderate persistent asthma without complication Nebulizer treatment given Sent to ED for further treatment 2. Hypoxia  Recommend pt go to ED by EMS but refused and drove self to [facility] ED - triage notified No orders of the defined types were placed in this encounter.   No orders of the defined types were placed in this encounter.     Follow-up: Return if symptoms worsen or fail to improve.  An After Visit Summary was printed and given to the patient.  Yetta Flock [facility] [phone]

## 2020-11-04 DIAGNOSIS — I2699 Other pulmonary embolism without acute cor pulmonale: Secondary | ICD-10-CM | POA: Diagnosis not present

## 2020-11-04 DIAGNOSIS — R0602 Shortness of breath: Secondary | ICD-10-CM | POA: Diagnosis not present

## 2020-11-05 ENCOUNTER — Ambulatory Visit: Payer: PPO | Admitting: Physician Assistant

## 2020-11-05 DIAGNOSIS — M7989 Other specified soft tissue disorders: Secondary | ICD-10-CM | POA: Diagnosis not present

## 2020-11-05 DIAGNOSIS — M79661 Pain in right lower leg: Secondary | ICD-10-CM | POA: Diagnosis not present

## 2020-11-11 ENCOUNTER — Telehealth: Payer: Self-pay

## 2020-11-11 NOTE — Telephone Encounter (Signed)
  Transition Care Management Follow-up Telephone Call    ERNESTA TRABERT [birth year]  Admit Date: 10/31/20 Discharge Date: 11/10/20 Discharged from where: [facility]  Diagnoses: PE, Pulmonary nodule  2 day post discharge: 11/12/20 7 day post discharge: 11/17/20 14 day post discharge: 11/24/20  ROSELL KHOURI was discharged from [facility] on 11/10/20 with the diagnoses listed above.  She was contacted today via telephone in regards to transition of care.  I called her home and was told she is staying with a family member since discharge.  I sent her a message via MyChart to contact the office in regards to a hospital follow-up appointment.  Letesha was sent to the ED from this office for [facility] and subsequently admitted. -Chest X-Ray was negative for acute findings. -CTA chest - showed bilateral PE in the lower lobes as well as a 2.1 x 1 cm pulmonary nodule in the left lower lobe concerning for malignancy.  Outpatient follow-up recommended for PET scan and biopsy. -BL Venous Study (lower extremity) NEGATIVE for DVT She was started on Eliquis 10 mg PO BID x 7 days then lowered to 5 mg BID She was also started on Norvasc 5 mg PO QD   Discharge Instructions: Follow-up with PCP in one week, Follow-up with Dr Alcide Clever in one week  Items Reviewed: Did the pt receive and understand the discharge instructions provided? Yes  Medications obtained and verified? Yes  Any new allergies since your discharge? No  Dietary orders reviewed? Yes - Low Salt Do you have support at home? Yes  staying with family member  [place] and Equipment/Supplies: Were home health services ordered? no Were any new equipment or medical supplies ordered?  No  Functional Questionnaire: (I = Independent and D = Dependent) ADLs: I  Bathing/Dressing- I  Meal Prep- I  Eating- I  Maintaining continence- I  Transferring/Ambulation- I  Managing Meds- I   Follow up appointments reviewed: PCP Hospital f/u  appt confirmed? No   Specialist Hospital f/u appt confirmed? No  Are transportation arrangements needed? No  If their condition worsens, is the pt aware to call PCP or go to the Emergency Dept.? Yes Was the patient provided with contact information for the PCP's office/after hours number? Yes Was to pt encouraged to call back with questions or concerns? Yes    Shelle Iron, LPN 72/25/75 0:51 PM

## 2020-11-18 ENCOUNTER — Telehealth: Payer: Self-pay | Admitting: Oncology

## 2020-11-18 ENCOUNTER — Encounter: Payer: Self-pay | Admitting: Physician Assistant

## 2020-11-18 ENCOUNTER — Other Ambulatory Visit: Payer: Self-pay

## 2020-11-18 ENCOUNTER — Ambulatory Visit (INDEPENDENT_AMBULATORY_CARE_PROVIDER_SITE_OTHER): Payer: PPO | Admitting: Physician Assistant

## 2020-11-18 VITALS — BP 132/78 | HR 91 | Temp 97.7°F | Ht 63.0 in | Wt 210.0 lb

## 2020-11-18 DIAGNOSIS — G8929 Other chronic pain: Secondary | ICD-10-CM

## 2020-11-18 DIAGNOSIS — M25561 Pain in right knee: Secondary | ICD-10-CM | POA: Diagnosis not present

## 2020-11-18 DIAGNOSIS — I2694 Multiple subsegmental pulmonary emboli without acute cor pulmonale: Secondary | ICD-10-CM

## 2020-11-18 DIAGNOSIS — R911 Solitary pulmonary nodule: Secondary | ICD-10-CM | POA: Diagnosis not present

## 2020-11-18 DIAGNOSIS — M25562 Pain in left knee: Secondary | ICD-10-CM

## 2020-11-18 DIAGNOSIS — R899 Unspecified abnormal finding in specimens from other organs, systems and tissues: Secondary | ICD-10-CM | POA: Diagnosis not present

## 2020-11-18 DIAGNOSIS — I10 Essential (primary) hypertension: Secondary | ICD-10-CM | POA: Diagnosis not present

## 2020-11-18 MED ORDER — OXYCODONE HCL 5 MG PO TABS: 5.0000 mg | ORAL_TABLET | Freq: Two times a day (BID) | ORAL | 0 refills | Status: DC | PRN

## 2020-11-18 MED ORDER — VITAMIN D (ERGOCALCIFEROL) 1.25 MG (50000 UNIT) PO CAPS: 50000.0000 [IU] | ORAL_CAPSULE | ORAL | 1 refills | Status: DC

## 2020-11-18 NOTE — Telephone Encounter (Signed)
Per Dr Rochel Brome, please scheduled patient for Hospital Follow Up for Lung Mass.  Appt made for 11/19/20 at 9:15 am

## 2020-11-18 NOTE — Progress Notes (Signed)
Subjective:  Patient ID: Natalie Perry, female    DOB: August 30, 1960  Age: 60 y.o. MRN: [id number]  Chief Complaint  Patient presents with   Hospitalization Follow-up    HPI  Pt here for hospital follow up - she was admitted to [facility] on 10/31/20 and discharged on 11/10/2020 - states that overall she is feeling better and voices no problems or concerns today Pt was diagnosed with bilateral subsegmental pulmonary emboli and started on eliquis - her current dose is 5mg  bid She was also found to have a spiculated lung mass - she has appt with pulmonologist tomorrow Pt does see Dr Bobby Rumpf for thrombocytopenia and so with her new findings will call to set up appointment for further follow up and recommendation to get PET scan  Pt with history of hypertension - amlodipine was added in to her medications - she is tolerating medication well without side effects  Pt requests refill of vitamin D and pain meds for her chronic knee pain today Current Outpatient Medications on File Prior to Visit  Medication Sig Dispense Refill   albuterol (PROVENTIL) (2.5 MG/3ML) 0.083% nebulizer solution INHALE 1 VIAL VIA NEBULIZER 3 TO 4 TIMES A DAY AS DIRECTED 300 mL 3   albuterol (VENTOLIN HFA) 108 (90 Base) MCG/ACT inhaler INHALE 1 TO 2 PUFFS BY MOUTH EVERY 4 TO 6 HOURS AS NEEDED 8.5 g 5   amLODipine (NORVASC) 5 MG tablet Take 5 mg by mouth daily.     betamethasone dipropionate (DIPROLENE) 0.05 % ointment Apply topically daily as needed.     budesonide-formoterol (SYMBICORT) 160-4.5 MCG/ACT inhaler Inhale 2 puffs into the lungs 2 (two) times daily. 1 each 5   citalopram (CELEXA) 20 MG tablet Take 1 tablet (20 mg total) by mouth daily. 90 tablet 1   ELIQUIS 5 MG TABS tablet Take 5 mg by mouth 2 (two) times daily.     lactulose (CHRONULAC) 10 GM/15ML solution      levothyroxine (SYNTHROID) 100 MCG tablet TAKE 1 TABLET(100 MCG) BY MOUTH EVERY DAY 90 tablet 0   LORazepam (ATIVAN) 1 MG tablet TAKE 1 TABLET(1  MG) BY MOUTH THREE TIMES DAILY AS NEEDED FOR ANXIETY 90 tablet 1   losartan-hydrochlorothiazide (HYZAAR) 100-25 MG tablet TAKE 1 TABLET BY MOUTH DAILY 90 tablet 0   meloxicam (MOBIC) 7.5 MG tablet Take 7.5 mg by mouth 2 (two) times daily as needed.     nystatin (MYCOSTATIN) 100000 UNIT/ML suspension Take 1 mL by mouth 3 (three) times daily.     potassium chloride SA (KLOR-CON) 20 MEQ tablet Take 2 tablets (40 mEq total) by mouth 2 (two) times daily. 360 tablet 0   predniSONE (DELTASONE) 10 MG tablet Take 10 mg by mouth daily.     promethazine (PHENERGAN) 25 MG tablet Take 1 tablet (25 mg total) by mouth every 8 (eight) hours as needed for nausea or vomiting. 30 tablet 2   No current facility-administered medications on file prior to visit.   Past Medical History:  Diagnosis Date   Arthritis    Asthma    Bipolar affective disorder ([place])    Cirrhosis of liver due to hepatitis C    Thyroid disease    Past Surgical History:  Procedure Laterality Date   ABDOMINAL HYSTERECTOMY     ABDOMINAL SURGERY     APPENDECTOMY     CHOLECYSTECTOMY      Family History  Problem Relation Age of Onset   Asthma Mother    Bipolar disorder Mother  CAD Father    Hypertension Father    Social History   Socioeconomic History   Marital status: Divorced    Spouse name: Not on file   Number of children: 2   Years of education: Not on file   Highest education level: Not on file  Occupational History   Occupation: disabled  Tobacco Use   Smoking status: Never   Smokeless tobacco: Never  Vaping Use   Vaping Use: Never used  Substance and Sexual Activity   Alcohol use: No   Drug use: Never   Sexual activity: Not on file  Other Topics Concern   Not on file  Social History Narrative   Not on file   Social Determinants of Health   Financial Resource Strain: Not on file  Food Insecurity: No Food Insecurity   Worried About Running Out of Food in the Last Year: Never true   [place] in  the Last Year: Never true  Transportation Needs: No Transportation Needs   Lack of Transportation (Medical): No   Lack of Transportation (Non-Medical): No  Physical Activity: Not on file  Stress: Not on file  Social Connections: Not on file    Review of Systems  CONSTITUTIONAL: Negative for chills, fatigue, fever, unintentional weight gain and unintentional weight loss.  CARDIOVASCULAR: Negative for chest pain, dizziness, palpitations and pedal edema.  RESPIRATORY: Negative for recent cough and dyspnea.  GASTROINTESTINAL: Negative for abdominal pain, acid reflux symptoms, constipation, diarrhea, nausea and vomiting.  MSK: Negative for arthralgias and myalgias.  INTEGUMENTARY: Negative for rash.  PSYCHIATRIC: Negative for sleep disturbance and to question depression screen.  Negative for depression, negative for anhedonia.      Objective:  BP 132/78 (BP Location: Right Arm, Patient Position: Sitting, Cuff Size: Normal)   Pulse 91   Temp 97.7 F (36.5 C) (Temporal)   Ht 5\' 3"  (1.6 m)   Wt 210 lb (95.3 kg)   SpO2 94%   BMI 37.20 kg/m   BP/Weight 11/18/2020 7/98/9211 01/12/1739  Systolic BP 814 481 856  Diastolic BP 78 78 77  Wt. (Lbs) 210 210 210.1  BMI 37.2 37.2 37.22    Physical Exam PHYSICAL EXAM:   VS: BP 132/78 (BP Location: Right Arm, Patient Position: Sitting, Cuff Size: Normal)   Pulse 91   Temp 97.7 F (36.5 C) (Temporal)   Ht 5\' 3"  (1.6 m)   Wt 210 lb (95.3 kg)   SpO2 94%   BMI 37.20 kg/m   GEN: Well nourished, well developed, in no acute distress  Cardiac: RRR; no murmurs, rubs, or gallops,no edema -  Respiratory:  normal respiratory rate and pattern with no distress - normal breath sounds with no rales, rhonchi, wheezes or rubs Skin: warm and dry, no rash  Psych: euthymic mood, appropriate affect and demeanor  Diabetic Foot Exam - Simple   No data filed      Lab Results  Component Value Date   WBC 6.4 08/08/2020   HGB 13.6 08/08/2020   HCT 41  08/08/2020   PLT 97 (A) 08/08/2020   GLUCOSE 92 07/30/2020   CHOL 118 07/30/2020   TRIG 65 07/30/2020   HDL 43 07/30/2020   LDLCALC 61 07/30/2020   ALT 29 08/08/2020   AST 40 (A) 08/08/2020   NA 136 (A) 08/08/2020   K 3.8 08/08/2020   CL 105 08/08/2020   CREATININE 0.5 08/08/2020   BUN 11 08/08/2020   CO2 24 (A) 08/08/2020   TSH 2.970  07/30/2020   INR 1.1 11/16/2006      Assessment & Plan:   1. Multiple subsegmental pulmonary emboli without acute cor pulmonale (HCC) Continue current meds - recommend evaluation with hematologist 2. Lung nodule Follow up with pulmonologist as scheduled and also with hematologist for PET scan 3. Abnormal laboratory test - Comprehensive metabolic panel - Magnesium  4. Chronic pain of both knees - oxyCODONE (OXY IR/ROXICODONE) 5 MG immediate release tablet; Take 1 tablet (5 mg total) by mouth 2 (two) times daily as needed.  Dispense: 60 tablet; Refill: 0  5. Benign hypertension  Continue current meds  Meds ordered this encounter  Medications   Vitamin D, Ergocalciferol, (DRISDOL) 1.25 MG (50000 UNIT) CAPS capsule    Sig: Take 1 capsule (50,000 Units total) by mouth every 7 (seven) days.    Dispense:  12 capsule    Refill:  1    Order Specific Question:   Supervising Provider    Answer:   Shelton Silvas   oxyCODONE (OXY IR/ROXICODONE) 5 MG immediate release tablet    Sig: Take 1 tablet (5 mg total) by mouth 2 (two) times daily as needed.    Dispense:  60 tablet    Refill:  0    Order Specific Question:   Supervising Provider    AnswerShelton Silvas    Orders Placed This Encounter  Procedures   Comprehensive metabolic panel   Magnesium     Follow-up: Return in about 4 weeks (around 12/16/2020) for follow up.  An After Visit Summary was printed and given to the patient.  Yetta Flock [facility] [phone]

## 2020-11-19 ENCOUNTER — Ambulatory Visit: Payer: PPO | Admitting: Oncology

## 2020-11-19 ENCOUNTER — Telehealth: Payer: Self-pay | Admitting: Oncology

## 2020-11-19 DIAGNOSIS — G4733 Obstructive sleep apnea (adult) (pediatric): Secondary | ICD-10-CM | POA: Diagnosis not present

## 2020-11-19 DIAGNOSIS — R911 Solitary pulmonary nodule: Secondary | ICD-10-CM | POA: Diagnosis not present

## 2020-11-19 DIAGNOSIS — J4540 Moderate persistent asthma, uncomplicated: Secondary | ICD-10-CM | POA: Diagnosis not present

## 2020-11-19 DIAGNOSIS — R5383 Other fatigue: Secondary | ICD-10-CM | POA: Diagnosis not present

## 2020-11-19 LAB — COMPREHENSIVE METABOLIC PANEL
ALT: 34 IU/L — ABNORMAL HIGH (ref 0–32)
AST: 29 IU/L (ref 0–40)
Albumin/Globulin Ratio: 1.7 (ref 1.2–2.2)
Albumin: 4.2 g/dL (ref 3.8–4.9)
Alkaline Phosphatase: 60 IU/L (ref 44–121)
BUN/Creatinine Ratio: 23 (ref 12–28)
BUN: 16 mg/dL (ref 8–27)
Bilirubin Total: 1.1 mg/dL (ref 0.0–1.2)
CO2: 21 mmol/L (ref 20–29)
Calcium: 9.3 mg/dL (ref 8.7–10.3)
Chloride: 97 mmol/L (ref 96–106)
Creatinine, Ser: 0.7 mg/dL (ref 0.57–1.00)
Globulin, Total: 2.5 g/dL (ref 1.5–4.5)
Glucose: 113 mg/dL — ABNORMAL HIGH (ref 65–99)
Potassium: 3.7 mmol/L (ref 3.5–5.2)
Sodium: 136 mmol/L (ref 134–144)
Total Protein: 6.7 g/dL (ref 6.0–8.5)
eGFR: 99 mL/min/{1.73_m2} (ref >59)

## 2020-11-19 LAB — MAGNESIUM: Magnesium: 2 mg/dL (ref 1.6–2.3)

## 2020-11-19 NOTE — Telephone Encounter (Signed)
Patient rescheduled 7/13 Follow Up to 7/18 at 4:15 pm

## 2020-11-19 NOTE — Telephone Encounter (Signed)
Patient called to [facility] 7/18 Follow Up - Dr Alcide Clever in the process of ordering PET Scan

## 2020-11-20 ENCOUNTER — Telehealth: Payer: Self-pay

## 2020-11-20 NOTE — Progress Notes (Signed)
    Chronic Care Management Pharmacy Assistant   Name: Natalie Perry  MRN: [id number] DOB: [birth year]   Reason for Encounter: Disease State for hospital follow up    Recent office visits:  11/18/20-[name], hospital follow up, labs ordered, the following medications were stopped Buspar, Vitamin D, Iron, Levaquin, follow 4 weeks   Recent consult visits:  none  Hospital visits:  [facility]-Admission 10/31/20, discharge 11/10/20, diagnosis PE, Pulmonary nodule. Eliquis and Amlodipine were added   Medications: Outpatient Encounter Medications as of 11/20/2020  Medication Sig   albuterol (PROVENTIL) (2.5 MG/3ML) 0.083% nebulizer solution INHALE 1 VIAL VIA NEBULIZER 3 TO 4 TIMES A DAY AS DIRECTED   albuterol (VENTOLIN HFA) 108 (90 Base) MCG/ACT inhaler INHALE 1 TO 2 PUFFS BY MOUTH EVERY 4 TO 6 HOURS AS NEEDED   amLODipine (NORVASC) 5 MG tablet Take 5 mg by mouth daily.   betamethasone dipropionate (DIPROLENE) 0.05 % ointment Apply topically daily as needed.   budesonide-formoterol (SYMBICORT) 160-4.5 MCG/ACT inhaler Inhale 2 puffs into the lungs 2 (two) times daily.   citalopram (CELEXA) 20 MG tablet Take 1 tablet (20 mg total) by mouth daily.   ELIQUIS 5 MG TABS tablet Take 5 mg by mouth 2 (two) times daily.   lactulose (CHRONULAC) 10 GM/15ML solution    levothyroxine (SYNTHROID) 100 MCG tablet TAKE 1 TABLET(100 MCG) BY MOUTH EVERY DAY   LORazepam (ATIVAN) 1 MG tablet TAKE 1 TABLET(1 MG) BY MOUTH THREE TIMES DAILY AS NEEDED FOR ANXIETY   losartan-hydrochlorothiazide (HYZAAR) 100-25 MG tablet TAKE 1 TABLET BY MOUTH DAILY   meloxicam (MOBIC) 7.5 MG tablet Take 7.5 mg by mouth 2 (two) times daily as needed.   nystatin (MYCOSTATIN) 100000 UNIT/ML suspension Take 1 mL by mouth 3 (three) times daily.   oxyCODONE (OXY IR/ROXICODONE) 5 MG immediate release tablet Take 1 tablet (5 mg total) by mouth 2 (two) times daily as needed.   potassium chloride SA (KLOR-CON) 20 MEQ tablet Take  2 tablets (40 mEq total) by mouth 2 (two) times daily.   predniSONE (DELTASONE) 10 MG tablet Take 10 mg by mouth daily.   promethazine (PHENERGAN) 25 MG tablet Take 1 tablet (25 mg total) by mouth every 8 (eight) hours as needed for nausea or vomiting.   Vitamin D, Ergocalciferol, (DRISDOL) 1.25 MG (50000 UNIT) CAPS capsule Take 1 capsule (50,000 Units total) by mouth every 7 (seven) days.   No facility-administered encounter medications on file as of 11/20/2020.   Spoke to patient, she stated she is doing some better.  She stated she still gets short of breath, she will lay down and rest and it gets better.  I advised her if it gets to bad or increases more often then she is to call the office ASAP.  Patient stated she has all of her medications.  She is now going to be on Eliquis  and Amlodipine.  She stated she may need to have her blood pressure medication adjusted, she is having increased urination and is having to wear a depend garment at this time.    She stated she was unable to tolerate the Buspar, but she has started back on her Vitamin D, she has 2 Symbicrt inhalers, she will call when she starts the last one.   Clarita Leber, [place] Pharmacist Assistant [phone]

## 2020-11-24 ENCOUNTER — Ambulatory Visit: Payer: PPO | Admitting: Oncology

## 2020-11-25 ENCOUNTER — Telehealth: Payer: PPO

## 2020-11-25 ENCOUNTER — Other Ambulatory Visit: Payer: Self-pay | Admitting: Physician Assistant

## 2020-12-03 ENCOUNTER — Other Ambulatory Visit: Payer: Self-pay

## 2020-12-03 MED ORDER — ELIQUIS 5 MG PO TABS: 5.0000 mg | ORAL_TABLET | Freq: Two times a day (BID) | ORAL | 0 refills | Status: DC

## 2020-12-04 DIAGNOSIS — R7301 Impaired fasting glucose: Secondary | ICD-10-CM | POA: Diagnosis not present

## 2020-12-04 DIAGNOSIS — R911 Solitary pulmonary nodule: Secondary | ICD-10-CM | POA: Diagnosis not present

## 2020-12-04 DIAGNOSIS — J439 Emphysema, unspecified: Secondary | ICD-10-CM | POA: Diagnosis not present

## 2020-12-17 ENCOUNTER — Telehealth: Payer: Self-pay

## 2020-12-17 NOTE — Telephone Encounter (Signed)
error 

## 2020-12-22 ENCOUNTER — Telehealth: Payer: Self-pay

## 2020-12-22 NOTE — Telephone Encounter (Signed)
Pt returned call. Pt VU.   Natalie Perry 12/22/20 3:44 PM

## 2020-12-22 NOTE — Telephone Encounter (Signed)
Attempted to call pt. Left generic VM for return call on nonidentifying VM.   Royce Macadamia, [place] 12/22/20 1:24 PM

## 2020-12-22 NOTE — Telephone Encounter (Signed)
Ok to stop prednisone lp

## 2020-12-22 NOTE — Telephone Encounter (Signed)
Pt calling questioning if she is to continue the prednisone she was d/c from hospital on. D/c from hospital on 7/4 and given prednisone taper, #40 tabs. D/c paperwork in media. Pt has finished but questioning if she needs more.   Sending to provider and provider that is in office.   Royce Macadamia, [place] 12/22/20 11:56 AM

## 2020-12-24 ENCOUNTER — Ambulatory Visit: Payer: PPO | Admitting: Physician Assistant

## 2020-12-29 ENCOUNTER — Telehealth: Payer: Self-pay

## 2020-12-29 DIAGNOSIS — R5383 Other fatigue: Secondary | ICD-10-CM | POA: Diagnosis not present

## 2020-12-29 DIAGNOSIS — J4540 Moderate persistent asthma, uncomplicated: Secondary | ICD-10-CM | POA: Diagnosis not present

## 2020-12-29 DIAGNOSIS — G4733 Obstructive sleep apnea (adult) (pediatric): Secondary | ICD-10-CM | POA: Diagnosis not present

## 2020-12-29 DIAGNOSIS — R911 Solitary pulmonary nodule: Secondary | ICD-10-CM | POA: Diagnosis not present

## 2020-12-29 DIAGNOSIS — I2721 Secondary pulmonary arterial hypertension: Secondary | ICD-10-CM | POA: Diagnosis not present

## 2020-12-29 NOTE — Chronic Care Management (AMB) (Signed)
    Chronic Care Management Pharmacy Assistant   Name: Natalie Perry  MRN: [id number] DOB: 1960/06/22  Reason for Encounter: Acute Medication Coordination  Outpatient Encounter Medications as of 12/29/2020  Medication Sig   albuterol (PROVENTIL) (2.5 MG/3ML) 0.083% nebulizer solution INHALE 1 VIAL VIA NEBULIZER 3 TO 4 TIMES A DAY AS DIRECTED   albuterol (VENTOLIN HFA) 108 (90 Base) MCG/ACT inhaler INHALE 1 TO 2 PUFFS BY MOUTH EVERY 4 TO 6 HOURS AS NEEDED   amLODipine (NORVASC) 5 MG tablet TAKE 1 TABLET BY MOUTH EVERY DAY   betamethasone dipropionate (DIPROLENE) 0.05 % ointment Apply topically daily as needed.   budesonide-formoterol (SYMBICORT) 160-4.5 MCG/ACT inhaler Inhale 2 puffs into the lungs 2 (two) times daily.   citalopram (CELEXA) 20 MG tablet Take 1 tablet (20 mg total) by mouth daily.   ELIQUIS 5 MG TABS tablet Take 1 tablet (5 mg total) by mouth 2 (two) times daily.   lactulose (CHRONULAC) 10 GM/15ML solution    levothyroxine (SYNTHROID) 100 MCG tablet TAKE 1 TABLET(100 MCG) BY MOUTH EVERY DAY   LORazepam (ATIVAN) 1 MG tablet TAKE 1 TABLET(1 MG) BY MOUTH THREE TIMES DAILY AS NEEDED FOR ANXIETY   losartan-hydrochlorothiazide (HYZAAR) 100-25 MG tablet TAKE 1 TABLET BY MOUTH DAILY   meloxicam (MOBIC) 7.5 MG tablet Take 7.5 mg by mouth 2 (two) times daily as needed.   nystatin (MYCOSTATIN) 100000 UNIT/ML suspension Take 1 mL by mouth 3 (three) times daily.   oxyCODONE (OXY IR/ROXICODONE) 5 MG immediate release tablet Take 1 tablet (5 mg total) by mouth 2 (two) times daily as needed.   potassium chloride SA (KLOR-CON) 20 MEQ tablet Take 2 tablets (40 mEq total) by mouth 2 (two) times daily.   predniSONE (DELTASONE) 10 MG tablet Take 10 mg by mouth daily.   promethazine (PHENERGAN) 25 MG tablet Take 1 tablet (25 mg total) by mouth every 8 (eight) hours as needed for nausea or vomiting.   Vitamin D, Ergocalciferol, (DRISDOL) 1.25 MG (50000 UNIT) CAPS capsule Take 1 capsule (50,000  Units total) by mouth every 7 (seven) days.   No facility-administered encounter medications on file as of 12/29/2020.    Coordinated acute fill  to be delivered 08.24.22 for Symbicort 160-4.5 mch/act Albuterol 108 mcg/act  [address] [place], [place]

## 2021-01-02 ENCOUNTER — Other Ambulatory Visit: Payer: Self-pay | Admitting: Physician Assistant

## 2021-01-07 ENCOUNTER — Ambulatory Visit (INDEPENDENT_AMBULATORY_CARE_PROVIDER_SITE_OTHER): Payer: PPO | Admitting: Physician Assistant

## 2021-01-07 ENCOUNTER — Other Ambulatory Visit: Payer: Self-pay

## 2021-01-07 ENCOUNTER — Encounter: Payer: Self-pay | Admitting: Physician Assistant

## 2021-01-07 VITALS — BP 102/62 | HR 90 | Temp 97.5°F | Ht 63.0 in | Wt 204.6 lb

## 2021-01-07 DIAGNOSIS — R3589 Other polyuria: Secondary | ICD-10-CM

## 2021-01-07 DIAGNOSIS — Z23 Encounter for immunization: Secondary | ICD-10-CM | POA: Diagnosis not present

## 2021-01-07 DIAGNOSIS — R911 Solitary pulmonary nodule: Secondary | ICD-10-CM

## 2021-01-07 DIAGNOSIS — I2694 Multiple subsegmental pulmonary emboli without acute cor pulmonale: Secondary | ICD-10-CM | POA: Diagnosis not present

## 2021-01-07 LAB — POCT URINALYSIS DIP (CLINITEK)
Bilirubin, UA: NEGATIVE
Blood, UA: NEGATIVE
Glucose, UA: NEGATIVE mg/dL
Ketones, POC UA: NEGATIVE mg/dL
Nitrite, UA: NEGATIVE
POC PROTEIN,UA: NEGATIVE
Spec Grav, UA: 1.01 (ref 1.010–1.025)
Urobilinogen, UA: 0.2 E.U./dL
pH, UA: 8.5 — AB (ref 5.0–8.0)

## 2021-01-07 NOTE — Progress Notes (Signed)
Subjective:  Patient ID: Natalie Perry, female    DOB: [birth year]  Age: 60 y.o. MRN: [id number]  Chief Complaint  Patient presents with   Follow-up    4 week follow up    Urinary Tract Infection    Patient states she has been going to the restroom more often for more then 3 days. No burning, smell, itching.     HPI  Pt here for follow up after being diagnosed with pulmonary emboli -- states her symptoms are stable and denies chest pain or shortness of breath Is currently following with Dr Alcide Clever and had a PET scan - she states she was told lesion noncancerous however I printed out PET scan results for patient that showed morphology of area is suspicious for bronchogenic carcinoma --- pt is unsure of what the plan is and states she was told 'it would be watched' ---- recommend she follow up with Dr Alcide Clever for plan of care for this area Also noted were reactive right axillary lymph nodes that would need follow up Will request records from Dr Alcide Clever to review   Pt has had some polyuria for the past 2 days - denies hematuria or dysuria  Pt would like flu shot today Current Outpatient Medications on File Prior to Visit  Medication Sig Dispense Refill   albuterol (PROVENTIL) (2.5 MG/3ML) 0.083% nebulizer solution INHALE 1 VIAL VIA NEBULIZER 3 TO 4 TIMES A DAY AS DIRECTED 300 mL 3   albuterol (VENTOLIN HFA) 108 (90 Base) MCG/ACT inhaler INHALE 1 TO 2 PUFFS BY MOUTH EVERY 4 TO 6 HOURS AS NEEDED 8.5 g 5   amLODipine (NORVASC) 5 MG tablet TAKE 1 TABLET BY MOUTH EVERY DAY 30 tablet 2   betamethasone dipropionate (DIPROLENE) 0.05 % ointment Apply topically daily as needed.     budesonide-formoterol (SYMBICORT) 160-4.5 MCG/ACT inhaler Inhale 2 puffs into the lungs 2 (two) times daily. 1 each 5   citalopram (CELEXA) 20 MG tablet Take 1 tablet (20 mg total) by mouth daily. 90 tablet 1   ELIQUIS 5 MG TABS tablet TAKE 1 TABLET(5 MG) BY MOUTH TWICE DAILY 60 tablet 0   lactulose (CHRONULAC) 10 GM/15ML  solution      levothyroxine (SYNTHROID) 100 MCG tablet TAKE 1 TABLET(100 MCG) BY MOUTH EVERY DAY 90 tablet 0   LORazepam (ATIVAN) 1 MG tablet TAKE 1 TABLET(1 MG) BY MOUTH THREE TIMES DAILY AS NEEDED FOR ANXIETY 90 tablet 1   losartan-hydrochlorothiazide (HYZAAR) 100-25 MG tablet TAKE 1 TABLET BY MOUTH DAILY 90 tablet 0   nystatin (MYCOSTATIN) 100000 UNIT/ML suspension Take 1 mL by mouth 3 (three) times daily.     oxyCODONE (OXY IR/ROXICODONE) 5 MG immediate release tablet Take 1 tablet (5 mg total) by mouth 2 (two) times daily as needed. 60 tablet 0   potassium chloride SA (KLOR-CON) 20 MEQ tablet Take 2 tablets (40 mEq total) by mouth 2 (two) times daily. 360 tablet 0   promethazine (PHENERGAN) 25 MG tablet Take 1 tablet (25 mg total) by mouth every 8 (eight) hours as needed for nausea or vomiting. 30 tablet 2   Vitamin D, Ergocalciferol, (DRISDOL) 1.25 MG (50000 UNIT) CAPS capsule Take 1 capsule (50,000 Units total) by mouth every 7 (seven) days. 12 capsule 1   No current facility-administered medications on file prior to visit.   Past Medical History:  Diagnosis Date   Arthritis    Asthma    Bipolar affective disorder ([place])    Cirrhosis of liver due to  hepatitis C    Thyroid disease    Past Surgical History:  Procedure Laterality Date   ABDOMINAL HYSTERECTOMY     ABDOMINAL SURGERY     APPENDECTOMY     CHOLECYSTECTOMY      Family History  Problem Relation Age of Onset   Asthma Mother    Bipolar disorder Mother    CAD Father    Hypertension Father    Social History   Socioeconomic History   Marital status: Divorced    Spouse name: Not on file   Number of children: 2   Years of education: Not on file   Highest education level: Not on file  Occupational History   Occupation: disabled  Tobacco Use   Smoking status: Never   Smokeless tobacco: Never  Vaping Use   Vaping Use: Never used  Substance and Sexual Activity   Alcohol use: No   Drug use: Never   Sexual  activity: Not on file  Other Topics Concern   Not on file  Social History Narrative   Not on file   Social Determinants of Health   Financial Resource Strain: Not on file  Food Insecurity: No Food Insecurity   Worried About Running Out of Food in the Last Year: Never true   [place] in the Last Year: Never true  Transportation Needs: No Transportation Needs   Lack of Transportation (Medical): No   Lack of Transportation (Non-Medical): No  Physical Activity: Not on file  Stress: Not on file  Social Connections: Not on file    Review of Systems CONSTITUTIONAL: Negative for chills, fatigue, fever, unintentional weight gain and unintentional weight loss.  E/N/T: Negative for ear pain, nasal congestion and sore throat.  CARDIOVASCULAR: Negative for chest pain, dizziness, palpitations and pedal edema.  RESPIRATORY: Negative for recent cough and dyspnea.  GASTROINTESTINAL: Negative for abdominal pain, acid reflux symptoms, constipation, diarrhea, nausea and vomiting.  MSK: Negative for arthralgias and myalgias.  INTEGUMENTARY: Negative for rash.  NEUROLOGICAL: Negative for dizziness and headaches.  PSYCHIATRIC: Negative for sleep disturbance and to question depression screen.  Negative for depression, negative for anhedonia.       Objective:  BP 102/62   Pulse 90   Temp (!) 97.5 F (36.4 C)   Ht 5\' 3"  (1.6 m)   Wt 204 lb 9.6 oz (92.8 kg)   SpO2 98%   BMI 36.24 kg/m   BP/Weight 01/07/2021 11/18/2020 2/50/5397  Systolic BP 673 419 379  Diastolic BP 62 78 78  Wt. (Lbs) 204.6 210 210  BMI 36.24 37.2 37.2    Physical Exam PHYSICAL EXAM:   VS: BP 102/62   Pulse 90   Temp (!) 97.5 F (36.4 C)   Ht 5\' 3"  (1.6 m)   Wt 204 lb 9.6 oz (92.8 kg)   SpO2 98%   BMI 36.24 kg/m   GEN: Well nourished, well developed, in no acute distress  Cardiac: RRR; no murmurs, rubs, or gallops,no edema -  Respiratory:  normal respiratory rate and pattern with no distress - normal  breath sounds with no rales, rhonchi, wheezes or rubGI: normal bowel sounds, no masses or tenderness MS: no deformity or atrophy  Skin: warm and dry, no rash   Diabetic Foot Exam - Simple   No data filed      Lab Results  Component Value Date   WBC 6.4 08/08/2020   HGB 13.6 08/08/2020   HCT 41 08/08/2020   PLT 97 (A) 08/08/2020  GLUCOSE 113 (H) 11/18/2020   CHOL 118 07/30/2020   TRIG 65 07/30/2020   HDL 43 07/30/2020   LDLCALC 61 07/30/2020   ALT 34 (H) 11/18/2020   AST 29 11/18/2020   NA 136 11/18/2020   K 3.7 11/18/2020   CL 97 11/18/2020   CREATININE 0.70 11/18/2020   BUN 16 11/18/2020   CO2 21 11/18/2020   TSH 2.970 07/30/2020   INR 1.1 11/16/2006      Assessment & Plan:   1. Multiple subsegmental pulmonary emboli without acute cor pulmonale (HCC) Continue current meds as directed 2. Lung nodule Follow up with Dr Alcide Clever and discuss treatment/follow up plan with him Will send for records 3. Polyuria - POCT URINALYSIS DIP (CLINITEK) Follow up if symptoms persist/worsen 4. Need for prophylactic vaccination and inoculation against influenza - Flu Vaccine MDCK QUAD PF   No orders of the defined types were placed in this encounter.   Orders Placed This Encounter  Procedures   Flu Vaccine MDCK QUAD PF   POCT URINALYSIS DIP (CLINITEK)     Follow-up: Return in about 4 weeks (around 02/04/2021) for chronic fasting follow up.  An After Visit Summary was printed and given to the patient.  Yetta Flock [facility] [phone]

## 2021-01-13 ENCOUNTER — Telehealth: Payer: Self-pay

## 2021-01-13 NOTE — Chronic Care Management (AMB) (Signed)
    Chronic Care Management Pharmacy Assistant   Name: SIYA FLURRY  MRN: [id number] DOB: 11-25-1960  Reason for Encounter: Medication Coordination  Recent office visits:  01/07/21- [name], PA-C- seen for 4 week follow up of pulmonary emboli, no medication changes, follow up 4 weeks  Recent consult visits:  No visits noted  Hospital visits:  None in previous 6 months  Medications: Outpatient Encounter Medications as of 01/13/2021  Medication Sig   albuterol (PROVENTIL) (2.5 MG/3ML) 0.083% nebulizer solution INHALE 1 VIAL VIA NEBULIZER 3 TO 4 TIMES A DAY AS DIRECTED   albuterol (VENTOLIN HFA) 108 (90 Base) MCG/ACT inhaler INHALE 1 TO 2 PUFFS BY MOUTH EVERY 4 TO 6 HOURS AS NEEDED   amLODipine (NORVASC) 5 MG tablet TAKE 1 TABLET BY MOUTH EVERY DAY   betamethasone dipropionate (DIPROLENE) 0.05 % ointment Apply topically daily as needed.   budesonide-formoterol (SYMBICORT) 160-4.5 MCG/ACT inhaler Inhale 2 puffs into the lungs 2 (two) times daily.   citalopram (CELEXA) 20 MG tablet Take 1 tablet (20 mg total) by mouth daily.   ELIQUIS 5 MG TABS tablet TAKE 1 TABLET(5 MG) BY MOUTH TWICE DAILY   lactulose (CHRONULAC) 10 GM/15ML solution    levothyroxine (SYNTHROID) 100 MCG tablet TAKE 1 TABLET(100 MCG) BY MOUTH EVERY DAY   LORazepam (ATIVAN) 1 MG tablet TAKE 1 TABLET(1 MG) BY MOUTH THREE TIMES DAILY AS NEEDED FOR ANXIETY   losartan-hydrochlorothiazide (HYZAAR) 100-25 MG tablet TAKE 1 TABLET BY MOUTH DAILY   nystatin (MYCOSTATIN) 100000 UNIT/ML suspension Take 1 mL by mouth 3 (three) times daily.   oxyCODONE (OXY IR/ROXICODONE) 5 MG immediate release tablet Take 1 tablet (5 mg total) by mouth 2 (two) times daily as needed.   potassium chloride SA (KLOR-CON) 20 MEQ tablet Take 2 tablets (40 mEq total) by mouth 2 (two) times daily.   promethazine (PHENERGAN) 25 MG tablet Take 1 tablet (25 mg total) by mouth every 8 (eight) hours as needed for nausea or vomiting.   Vitamin D,  Ergocalciferol, (DRISDOL) 1.25 MG (50000 UNIT) CAPS capsule Take 1 capsule (50,000 Units total) by mouth every 7 (seven) days.   No facility-administered encounter medications on file as of 01/13/2021.    Reviewed chart for medication changes ahead of medication coordination call.  No OVs, Consults, or hospital visits since last care coordination call/Pharmacist visit. (If appropriate, list visit date, provider name)  No medication changes indicated OR if recent visit, treatment plan here.  BP Readings from Last 3 Encounters:  01/07/21 102/62  11/18/20 132/78  10/31/20 128/78    No results found for: HGBA1C   Several unsuccessful attempts made to contact patient   Patient obtains medications through Vials  90 Days   Last adherence delivery included:  Symbicort 160-4.5 mch/act Albuterol 108 mcg/act  Patient is due for next adherence delivery on: Marland Kitchen Called patient and reviewed medications and coordinated delivery.  This delivery to include: Patient will need a short fill of (med), prior to adherence delivery. (To align with sync date or if PRN med)  Coordinated acute fill for (med) to be delivered (date).  Patient declined the following medications (meds) due to (reason)  Patient needs refills for .  Confirmed delivery date of , advised patient that pharmacy will contact them the morning of delivery.  Wilford Sports CPA,CMA

## 2021-01-14 DIAGNOSIS — R062 Wheezing: Secondary | ICD-10-CM | POA: Diagnosis not present

## 2021-01-19 ENCOUNTER — Other Ambulatory Visit: Payer: Self-pay | Admitting: Physician Assistant

## 2021-01-19 DIAGNOSIS — K7460 Unspecified cirrhosis of liver: Secondary | ICD-10-CM

## 2021-01-19 DIAGNOSIS — D696 Thrombocytopenia, unspecified: Secondary | ICD-10-CM

## 2021-01-20 ENCOUNTER — Other Ambulatory Visit: Payer: Self-pay | Admitting: Physician Assistant

## 2021-01-22 ENCOUNTER — Other Ambulatory Visit: Payer: Self-pay | Admitting: Physician Assistant

## 2021-01-22 ENCOUNTER — Other Ambulatory Visit: Payer: Self-pay | Admitting: Family Medicine

## 2021-01-22 NOTE — Telephone Encounter (Signed)
Your patient. KC °

## 2021-01-23 ENCOUNTER — Telehealth: Payer: Self-pay

## 2021-01-23 NOTE — Telephone Encounter (Signed)
I left a message on the number(s) listed in the patients chart requesting the patient to call back regarding the [place] appointment for 02/06/2021. The provider is out of the office that day. The appointment has been canceled. Waiting for the patient to return the call.

## 2021-01-23 NOTE — Chronic Care Management (AMB) (Signed)
Left patient a voicemail to reschedule 01-27-2021 telephone appointment with [place] [facility] [facility]. Informed patient that I will cancel appointment and wait for her call to reschedule.  [place] Pharmacist Assistant [phone]

## 2021-01-26 DIAGNOSIS — J45998 Other asthma: Secondary | ICD-10-CM | POA: Diagnosis not present

## 2021-01-26 DIAGNOSIS — I2721 Secondary pulmonary arterial hypertension: Secondary | ICD-10-CM | POA: Diagnosis not present

## 2021-01-26 DIAGNOSIS — J4540 Moderate persistent asthma, uncomplicated: Secondary | ICD-10-CM | POA: Diagnosis not present

## 2021-01-26 DIAGNOSIS — R911 Solitary pulmonary nodule: Secondary | ICD-10-CM | POA: Diagnosis not present

## 2021-01-26 DIAGNOSIS — R5383 Other fatigue: Secondary | ICD-10-CM | POA: Diagnosis not present

## 2021-01-26 DIAGNOSIS — G4733 Obstructive sleep apnea (adult) (pediatric): Secondary | ICD-10-CM | POA: Diagnosis not present

## 2021-01-27 ENCOUNTER — Telehealth: Payer: PPO

## 2021-02-01 ENCOUNTER — Other Ambulatory Visit: Payer: Self-pay | Admitting: Physician Assistant

## 2021-02-06 ENCOUNTER — Ambulatory Visit: Payer: PPO | Admitting: Physician Assistant

## 2021-02-11 ENCOUNTER — Encounter: Payer: Self-pay | Admitting: Physician Assistant

## 2021-02-11 ENCOUNTER — Ambulatory Visit (INDEPENDENT_AMBULATORY_CARE_PROVIDER_SITE_OTHER): Payer: PPO | Admitting: Physician Assistant

## 2021-02-11 ENCOUNTER — Other Ambulatory Visit: Payer: Self-pay

## 2021-02-11 VITALS — BP 130/82 | HR 99 | Temp 97.2°F | Ht 63.0 in | Wt 202.0 lb

## 2021-02-11 DIAGNOSIS — M25562 Pain in left knee: Secondary | ICD-10-CM

## 2021-02-11 DIAGNOSIS — G8929 Other chronic pain: Secondary | ICD-10-CM

## 2021-02-11 DIAGNOSIS — E039 Hypothyroidism, unspecified: Secondary | ICD-10-CM

## 2021-02-11 DIAGNOSIS — J4540 Moderate persistent asthma, uncomplicated: Secondary | ICD-10-CM

## 2021-02-11 DIAGNOSIS — M25561 Pain in right knee: Secondary | ICD-10-CM | POA: Diagnosis not present

## 2021-02-11 DIAGNOSIS — E559 Vitamin D deficiency, unspecified: Secondary | ICD-10-CM

## 2021-02-11 DIAGNOSIS — N3000 Acute cystitis without hematuria: Secondary | ICD-10-CM | POA: Diagnosis not present

## 2021-02-11 DIAGNOSIS — I10 Essential (primary) hypertension: Secondary | ICD-10-CM

## 2021-02-11 LAB — POCT URINALYSIS DIP (CLINITEK)
Bilirubin, UA: NEGATIVE
Blood, UA: NEGATIVE
Glucose, UA: NEGATIVE mg/dL
Ketones, POC UA: NEGATIVE mg/dL
Nitrite, UA: NEGATIVE
Spec Grav, UA: 1.01 (ref 1.010–1.025)
Urobilinogen, UA: 0.2 E.U./dL
pH, UA: 7.5 (ref 5.0–8.0)

## 2021-02-11 MED ORDER — OXYCODONE HCL 5 MG PO TABS: 5.0000 mg | ORAL_TABLET | Freq: Two times a day (BID) | ORAL | 0 refills | Status: DC | PRN

## 2021-02-11 MED ORDER — NITROFURANTOIN MONOHYD MACRO 100 MG PO CAPS: 100.0000 mg | ORAL_CAPSULE | Freq: Two times a day (BID) | ORAL | 0 refills | Status: DC

## 2021-02-11 NOTE — Progress Notes (Signed)
Established Patient Office Visit  Subjective:  Patient ID: Natalie Perry, female    DOB: 1960/06/20  Age: 60 y.o. MRN: [id number]  CC:  Chief Complaint  Patient presents with   Hypertension    HPI Natalie Perry presents for follow up chronic health problems  Pt for follow up of hypertension - she is tolerating medication well - currently on losartan/hctz 100/25 qd - states that she is not having chest pain or edema  Pt with history of asthma/COPD - pt states that her breathing has been well - she has a recent history of bilateral PE - is following with Dr Alcide Clever  Pt was found to have spiculated lung mass as well as reactive axillary lymph nodes - pt states that Dr Alcide Clever has told her the plan is to 'continue to watch ' at this point and pt is unaware of exactly what that meant --- will send for records It also appears that she missed her follow up appt with Dr Bobby Rumpf for her chronic thrombocytopenia and I have advised she reschedule that ASAP - I will send him a note about this patient as well  Pt with chronic history of pain in knee and back - she is not candidate for surgery - uses mobic 7.57m bid and oxycodone as needed- requests refill of pain med today  Pt with history of anxiety - she currently takes buspar and ativan which is controlling her symptoms well  Pt taking vit D - is due for labwork  Pt with history of hypothyroidism - currently on synthroid 1033m qd - due for labwork  Pt complains today of UTI symptoms - dysuria and urgency since last week Past Medical History:  Diagnosis Date   Arthritis    Asthma    [place]   Cirrhosis of liver due to hepatitis C    Thyroid disease     Past Surgical History:  Procedure Laterality Date   ABDOMINAL HYSTERECTOMY     ABDOMINAL SURGERY     APPENDECTOMY     CHOLECYSTECTOMY      Family History  Problem Relation Age of Onset   Asthma Mother    Bipolar disorder Mother    CAD Father     Hypertension Father     Social History   Socioeconomic History   Marital status: Divorced    Spouse name: Not on file   Number of children: 2   Years of education: Not on file   Highest education level: Not on file  Occupational History   Occupation: disabled  Tobacco Use   Smoking status: Never   Smokeless tobacco: Never  Vaping Use   Vaping Use: Never used  Substance and Sexual Activity   Alcohol use: No   Drug use: Never   Sexual activity: Not on file  Other Topics Concern   Not on file  Social History Narrative   Not on file   Social Determinants of Health   Financial Resource Strain: Not on file  Food Insecurity: No Food Insecurity   Worried About Running Out of Food in the Last Year: Never true   Ran Out of Food in the Last Year: Never true  Transportation Needs: No Transportation Needs   Lack of Transportation (Medical): No   Lack of Transportation (Non-Medical): No  Physical Activity: Not on file  Stress: Not on file  Social Connections: Not on file  Intimate Partner Violence: Not on file     Current Outpatient  Medications:    albuterol (PROVENTIL) (2.5 MG/3ML) 0.083% nebulizer solution, INHALE 1 VIAL VIA NEBULIZER 3 TO 4 TIMES A DAY AS DIRECTED, Disp: 300 mL, Rfl: 3   albuterol (VENTOLIN HFA) 108 (90 Base) MCG/ACT inhaler, INHALE 1 TO 2 PUFFS BY MOUTH EVERY 4 TO 6 HOURS AS NEEDED, Disp: 8.5 g, Rfl: 5   amLODipine (NORVASC) 5 MG tablet, TAKE 1 TABLET BY MOUTH EVERY DAY, Disp: 30 tablet, Rfl: 2   betamethasone dipropionate (DIPROLENE) 0.05 % ointment, Apply topically daily as needed., Disp: , Rfl:    budesonide-formoterol (SYMBICORT) 160-4.5 MCG/ACT inhaler, Inhale 2 puffs into the lungs 2 (two) times daily., Disp: 1 each, Rfl: 5   citalopram (CELEXA) 20 MG tablet, Take 1 tablet (20 mg total) by mouth daily., Disp: 90 tablet, Rfl: 1   ELIQUIS 5 MG TABS tablet, TAKE 1 TABLET(5 MG) BY MOUTH TWICE DAILY, Disp: 60 tablet, Rfl: 0   lactulose (CHRONULAC) 10 GM/15ML  solution, , Disp: , Rfl:    levothyroxine (SYNTHROID) 100 MCG tablet, TAKE ONE TABLET BY MOUTH EVERY MORNING, Disp: 90 tablet, Rfl: 0   LORazepam (ATIVAN) 1 MG tablet, TAKE 1 TABLET(1 MG) BY MOUTH THREE TIMES DAILY AS NEEDED FOR ANXIETY, Disp: 90 tablet, Rfl: 0   losartan-hydrochlorothiazide (HYZAAR) 100-25 MG tablet, TAKE ONE TABLET BY MOUTH ONCE DAILY, Disp: 90 tablet, Rfl: 0   nitrofurantoin, macrocrystal-monohydrate, (MACROBID) 100 MG capsule, Take 1 capsule (100 mg total) by mouth 2 (two) times daily., Disp: 20 capsule, Rfl: 0   nystatin (MYCOSTATIN) 100000 UNIT/ML suspension, Take 1 mL by mouth 3 (three) times daily., Disp: , Rfl:    potassium chloride SA (KLOR-CON) 20 MEQ tablet, TAKE TWO TABLETS BY MOUTH TWICE DAILY, Disp: 360 tablet, Rfl: 0   promethazine (PHENERGAN) 25 MG tablet, Take 1 tablet (25 mg total) by mouth every 8 (eight) hours as needed for nausea or vomiting., Disp: 30 tablet, Rfl: 2   Vitamin D, Ergocalciferol, (DRISDOL) 1.25 MG (50000 UNIT) CAPS capsule, TAKE 1 CAPSULE BY MOUTH EVERY 7 DAYS, Disp: 12 capsule, Rfl: 1   oxyCODONE (OXY IR/ROXICODONE) 5 MG immediate release tablet, Take 1 tablet (5 mg total) by mouth 2 (two) times daily as needed., Disp: 60 tablet, Rfl: 0   Allergies  Allergen Reactions   Penicillins Hives, Swelling, Rash and Other (See Comments)   Nicotine    CONSTITUTIONAL: Negative for chills, fatigue, fever, unintentional weight gain and unintentional weight loss.  E/N/T: Negative for ear pain, nasal congestion and sore throat.  CARDIOVASCULAR: Negative for chest pain, dizziness, palpitations and pedal edema.  RESPIRATORY: Negative for recent cough and dyspnea.  GASTROINTESTINAL: Negative for abdominal pain, acid reflux symptoms, constipation, diarrhea, nausea and vomiting.  MSK: see HPI GU - see HPI INTEGUMENTARY: Negative for rash.  NEUROLOGICAL: Negative for dizziness and headaches.  PSYCHIATRIC: Negative for sleep disturbance and to question  depression screen.  Negative for depression, negative for anhedonia.      Office Visit on 02/11/2021  Component Date Value Ref Range Status   Color, UA 02/11/2021 yellow  yellow Final   Clarity, UA 02/11/2021 clear  clear Final   Glucose, UA 02/11/2021 negative  negative mg/dL Final   Bilirubin, UA 02/11/2021 negative  negative Final   Ketones, POC UA 02/11/2021 negative  negative mg/dL Final   Spec Grav, UA 02/11/2021 1.010  1.010 - 1.025 Final   Blood, UA 02/11/2021 negative  negative Final   pH, UA 02/11/2021 7.5  5.0 - 8.0 Final   POC PROTEIN,UA  02/11/2021 trace  negative, trace Final   Urobilinogen, UA 02/11/2021 0.2  0.2 or 1.0 E.U./dL Final   Nitrite, UA 02/11/2021 Negative  Negative Final   Leukocytes, UA 02/11/2021 Large (3+) (A) Negative Final      Objective:   PHYSICAL EXAM:   VS: BP 130/82 (BP Location: Left Arm, Patient Position: Sitting, Cuff Size: Large)   Pulse 99   Temp (!) 97.2 F (36.2 C) (Temporal)   Ht 5' 3"  (1.6 m)   Wt 202 lb (91.6 kg)   SpO2 92%   BMI 35.78 kg/m   GEN: Well nourished, well developed, in no acute distress  Cardiac: RRR; no murmurs, rubs, or gallops,no edema - no significant varicosities Respiratory:  normal respiratory rate and pattern with no distress - normal breath sounds with no rales, rhonchi, wheezes or rubs MS: no deformity or atrophy  Skin: warm and dry, no rash  Neuro:  Alert and Oriented x 3, Strength and sensation are intact - CN II-Xii grossly intact Psych: euthymic mood, appropriate affect and demeanor  Office Visit on 02/11/2021  Component Date Value Ref Range Status   Color, UA 02/11/2021 yellow  yellow Final   Clarity, UA 02/11/2021 clear  clear Final   Glucose, UA 02/11/2021 negative  negative mg/dL Final   Bilirubin, UA 02/11/2021 negative  negative Final   Ketones, POC UA 02/11/2021 negative  negative mg/dL Final   Spec Grav, UA 02/11/2021 1.010  1.010 - 1.025 Final   Blood, UA 02/11/2021 negative  negative  Final   pH, UA 02/11/2021 7.5  5.0 - 8.0 Final   POC PROTEIN,UA 02/11/2021 trace  negative, trace Final   Urobilinogen, UA 02/11/2021 0.2  0.2 or 1.0 E.U./dL Final   Nitrite, UA 02/11/2021 Negative  Negative Final   Leukocytes, UA 02/11/2021 Large (3+) (A) Negative Final    Office Visit on 02/11/2021  Component Date Value Ref Range Status   Color, UA 02/11/2021 yellow  yellow Final   Clarity, UA 02/11/2021 clear  clear Final   Glucose, UA 02/11/2021 negative  negative mg/dL Final   Bilirubin, UA 02/11/2021 negative  negative Final   Ketones, POC UA 02/11/2021 negative  negative mg/dL Final   Spec Grav, UA 02/11/2021 1.010  1.010 - 1.025 Final   Blood, UA 02/11/2021 negative  negative Final   pH, UA 02/11/2021 7.5  5.0 - 8.0 Final   POC PROTEIN,UA 02/11/2021 trace  negative, trace Final   Urobilinogen, UA 02/11/2021 0.2  0.2 or 1.0 E.U./dL Final   Nitrite, UA 02/11/2021 Negative  Negative Final   Leukocytes, UA 02/11/2021 Large (3+) (A) Negative Final     Health Maintenance Due  Topic Date Due   Zoster Vaccines- Shingrix (1 of 2) Never done   COVID-19 Vaccine (4 - Booster for Pfizer series) 09/18/2020    There are no preventive care reminders to display for this patient.  Lab Results  Component Value Date   TSH 2.970 07/30/2020   Lab Results  Component Value Date   WBC 6.4 08/08/2020   HGB 13.6 08/08/2020   HCT 41 08/08/2020   MCV 90 07/30/2020   PLT 97 (A) 08/08/2020   Lab Results  Component Value Date   NA 136 11/18/2020   K 3.7 11/18/2020   CO2 21 11/18/2020   GLUCOSE 113 (H) 11/18/2020   BUN 16 11/18/2020   CREATININE 0.70 11/18/2020   BILITOT 1.1 11/18/2020   ALKPHOS 60 11/18/2020   AST 29 11/18/2020   ALT 34 (H)  11/18/2020   PROT 6.7 11/18/2020   ALBUMIN 4.2 11/18/2020   CALCIUM 9.3 11/18/2020   EGFR 99 11/18/2020   Lab Results  Component Value Date   CHOL 118 07/30/2020   Lab Results  Component Value Date   HDL 43 07/30/2020   Lab Results   Component Value Date   LDLCALC 61 07/30/2020   Lab Results  Component Value Date   TRIG 65 07/30/2020   Lab Results  Component Value Date   CHOLHDL 2.7 07/30/2020   No results found for: HGBA1C    Assessment & Plan:   Problem List Items Addressed This Visit       Cardiovascular and Mediastinum   Benign hypertension - Primary   Relevant Orders   CBC with Differential/Platelet   Comprehensive metabolic panel Continue current meds  Anxiety - continue meds as directed  Hx COPD Continue current meds  Hypothyroidism TSH pending Continue meds  Chronic knee pain Continue current meds as directed  Thrombocytopenia Continue follow up with hematology  Lung mass Continue follow up with Dr Alcide Clever Pt needs to also call and reschedule missed appt with Dr Bobby Rumpf (who she already sees for thrombocytopenia)  UTI Rx macrobid as directed Urine culture pending       Meds ordered this encounter  Medications   oxyCODONE (OXY IR/ROXICODONE) 5 MG immediate release tablet    Sig: Take 1 tablet (5 mg total) by mouth 2 (two) times daily as needed.    Dispense:  60 tablet    Refill:  0    Order Specific Question:   Supervising Provider    Answer:   Shelton Silvas   nitrofurantoin, macrocrystal-monohydrate, (MACROBID) 100 MG capsule    Sig: Take 1 capsule (100 mg total) by mouth 2 (two) times daily.    Dispense:  20 capsule    Refill:  0    Order Specific Question:   Supervising Provider    Answer:   Shelton Silvas    Follow-up: Return in about 3 months (around 05/14/2021) for chronic fasting follow up.    SARA R Enedelia Martorelli, PA-C

## 2021-02-12 LAB — CBC WITH DIFFERENTIAL/PLATELET
Basophils Absolute: 0 10*3/uL (ref 0.0–0.2)
Basos: 0 %
EOS (ABSOLUTE): 0.1 10*3/uL (ref 0.0–0.4)
Eos: 2 %
Hematocrit: 36.7 % (ref 34.0–46.6)
Hemoglobin: 12.6 g/dL (ref 11.1–15.9)
Immature Grans (Abs): 0 10*3/uL (ref 0.0–0.1)
Immature Granulocytes: 0 %
Lymphocytes Absolute: 0.8 10*3/uL (ref 0.7–3.1)
Lymphs: 14 %
MCH: 29.4 pg (ref 26.6–33.0)
MCHC: 34.3 g/dL (ref 31.5–35.7)
MCV: 86 fL (ref 79–97)
Monocytes Absolute: 0.5 10*3/uL (ref 0.1–0.9)
Monocytes: 8 %
Neutrophils Absolute: 4.3 10*3/uL (ref 1.4–7.0)
Neutrophils: 76 %
Platelets: 107 10*3/uL — ABNORMAL LOW (ref 150–450)
RBC: 4.29 x10E6/uL (ref 3.77–5.28)
RDW: 13.1 % (ref 11.7–15.4)
WBC: 5.8 10*3/uL (ref 3.4–10.8)

## 2021-02-12 LAB — COMPREHENSIVE METABOLIC PANEL
ALT: 19 IU/L (ref 0–32)
AST: 35 IU/L (ref 0–40)
Albumin/Globulin Ratio: 1.5 (ref 1.2–2.2)
Albumin: 4 g/dL (ref 3.8–4.8)
Alkaline Phosphatase: 79 IU/L (ref 44–121)
BUN/Creatinine Ratio: 19 (ref 12–28)
BUN: 13 mg/dL (ref 8–27)
Bilirubin Total: 1 mg/dL (ref 0.0–1.2)
CO2: 20 mmol/L (ref 20–29)
Calcium: 8.7 mg/dL (ref 8.7–10.3)
Chloride: 103 mmol/L (ref 96–106)
Creatinine, Ser: 0.68 mg/dL (ref 0.57–1.00)
Globulin, Total: 2.7 g/dL (ref 1.5–4.5)
Glucose: 99 mg/dL (ref 70–99)
Potassium: 3.6 mmol/L (ref 3.5–5.2)
Sodium: 140 mmol/L (ref 134–144)
Total Protein: 6.7 g/dL (ref 6.0–8.5)
eGFR: 99 mL/min/{1.73_m2} (ref >59)

## 2021-02-12 LAB — TSH: TSH: 2.95 u[IU]/mL (ref 0.450–4.500)

## 2021-02-12 LAB — LIPID PANEL
Chol/HDL Ratio: 3 ratio (ref 0.0–4.4)
Cholesterol, Total: 102 mg/dL (ref 100–199)
HDL: 34 mg/dL — ABNORMAL LOW (ref >39)
LDL Chol Calc (NIH): 55 mg/dL (ref 0–99)
Triglycerides: 55 mg/dL (ref 0–149)
VLDL Cholesterol Cal: 13 mg/dL (ref 5–40)

## 2021-02-12 LAB — VITAMIN D 25 HYDROXY (VIT D DEFICIENCY, FRACTURES): Vit D, 25-Hydroxy: 53.9 ng/mL (ref 30.0–100.0)

## 2021-02-12 LAB — CARDIOVASCULAR RISK ASSESSMENT

## 2021-02-13 LAB — URINE CULTURE

## 2021-02-25 DIAGNOSIS — J45998 Other asthma: Secondary | ICD-10-CM | POA: Diagnosis not present

## 2021-03-01 ENCOUNTER — Other Ambulatory Visit: Payer: Self-pay | Admitting: Physician Assistant

## 2021-03-07 ENCOUNTER — Other Ambulatory Visit: Payer: Self-pay | Admitting: Physician Assistant

## 2021-03-11 ENCOUNTER — Other Ambulatory Visit: Payer: Self-pay | Admitting: Physician Assistant

## 2021-03-15 DIAGNOSIS — G4733 Obstructive sleep apnea (adult) (pediatric): Secondary | ICD-10-CM | POA: Diagnosis not present

## 2021-03-23 DIAGNOSIS — R911 Solitary pulmonary nodule: Secondary | ICD-10-CM | POA: Diagnosis not present

## 2021-03-23 DIAGNOSIS — R5383 Other fatigue: Secondary | ICD-10-CM | POA: Diagnosis not present

## 2021-03-23 DIAGNOSIS — I2721 Secondary pulmonary arterial hypertension: Secondary | ICD-10-CM | POA: Diagnosis not present

## 2021-03-23 DIAGNOSIS — J4540 Moderate persistent asthma, uncomplicated: Secondary | ICD-10-CM | POA: Diagnosis not present

## 2021-03-23 DIAGNOSIS — G4733 Obstructive sleep apnea (adult) (pediatric): Secondary | ICD-10-CM | POA: Diagnosis not present

## 2021-03-28 DIAGNOSIS — J45998 Other asthma: Secondary | ICD-10-CM | POA: Diagnosis not present

## 2021-04-04 ENCOUNTER — Other Ambulatory Visit: Payer: Self-pay | Admitting: Physician Assistant

## 2021-04-13 ENCOUNTER — Telehealth: Payer: Self-pay

## 2021-04-13 NOTE — Chronic Care Management (AMB) (Signed)
Chronic Care Management Pharmacy Assistant   Name: Natalie Perry  MRN: [id number] DOB: Aug 22, 1960   Reason for Encounter: Medication Coordination for [facility]     Recent office visits:  02/11/21 [name] PA-C. Seen for Acute Cystitis. Started on Macrobid 100 mg 2 times daily.  Recent consult visits:  None  Hospital visits:  None   Medications: Outpatient Encounter Medications as of 04/13/2021  Medication Sig   albuterol (PROVENTIL) (2.5 MG/3ML) 0.083% nebulizer solution INHALE 1 VIAL VIA NEBULIZER 3 TO 4 TIMES A DAY AS DIRECTED   albuterol (VENTOLIN HFA) 108 (90 Base) MCG/ACT inhaler INHALE 1-2 PUFFS into THE lungs BY MOUTH EVERY 4 TO 6 HOURS AS NEEDED   amLODipine (NORVASC) 5 MG tablet TAKE 1 TABLET BY MOUTH EVERY DAY   betamethasone dipropionate (DIPROLENE) 0.05 % ointment Apply topically daily as needed.   budesonide-formoterol (SYMBICORT) 160-4.5 MCG/ACT inhaler Inhale 2 puffs into the lungs 2 (two) times daily.   citalopram (CELEXA) 20 MG tablet Take 1 tablet (20 mg total) by mouth daily.   ELIQUIS 5 MG TABS tablet TAKE 1 TABLET(5 MG) BY MOUTH TWICE DAILY   lactulose (CHRONULAC) 10 GM/15ML solution    levothyroxine (SYNTHROID) 100 MCG tablet TAKE ONE TABLET BY MOUTH EVERY MORNING   LORazepam (ATIVAN) 1 MG tablet TAKE 1 TABLET(1 MG) BY MOUTH THREE TIMES DAILY AS NEEDED FOR ANXIETY   losartan-hydrochlorothiazide (HYZAAR) 100-25 MG tablet TAKE ONE TABLET BY MOUTH ONCE DAILY   nitrofurantoin, macrocrystal-monohydrate, (MACROBID) 100 MG capsule Take 1 capsule (100 mg total) by mouth 2 (two) times daily.   nystatin (MYCOSTATIN) 100000 UNIT/ML suspension Take 1 mL by mouth 3 (three) times daily.   oxyCODONE (OXY IR/ROXICODONE) 5 MG immediate release tablet Take 1 tablet (5 mg total) by mouth 2 (two) times daily as needed.   potassium chloride SA (KLOR-CON) 20 MEQ tablet TAKE TWO TABLETS BY MOUTH TWICE DAILY   promethazine (PHENERGAN) 25 MG tablet Take 1 tablet (25 mg total)  by mouth every 8 (eight) hours as needed for nausea or vomiting.   Vitamin D, Ergocalciferol, (DRISDOL) 1.25 MG (50000 UNIT) CAPS capsule TAKE 1 CAPSULE BY MOUTH EVERY 7 DAYS   No facility-administered encounter medications on file as of 04/13/2021.   Reviewed chart for medication changes ahead of medication coordination call.  No OVs, Consults, or hospital visits since last care coordination call/Pharmacist visit. (If appropriate, list visit date, provider name)  No medication changes indicated OR if recent visit, treatment plan here.  BP Readings from Last 3 Encounters:  02/11/21 130/82  01/07/21 102/62  11/18/20 132/78    No results found for: HGBA1C   Patient obtains medications through Vials w/o safety caps  90 Days   Last adherence delivery included:  None   Patient is due for next adherence delivery on: 04/23/21.  Called patient and reviewed medications and coordinated delivery.  This delivery to include: Potassium Chloride 68meq 2 tabs twice daily Levothyroxine 171mcg 1 tab every morning Losartan-HCTZ 100-25mg  1 tab once daily Budesonide-Formot Inhaler 160-4.5 2 puffs twice daily Albuterol Inhaler 64mcg 1-2 puffs every 4-6 hours prn   Patient declined the following medications -Pt prefers to get these medications at [facility] for now Eliquis 5 mg  Amlodipine 5 mg Lorazepam 1 mg Oxycodone 5 mg Citalopram 20 mg Promethazine 25 mg  Patient needs refills:- Sent refill request to provider Potassium Chloride 29meq 2 tabs twice daily Levothyroxine 141mcg 1 tab every morning Losartan-HCTZ 100-25mg  1 tab once daily Budesonide-Formot Inhaler  160-4.5 2 puffs twice daily  Confirmed delivery date of 04/23/21, advised patient that pharmacy will contact them the morning of delivery.   Elray Mcgregor, [place] Pharmacist Assistant  [phone]

## 2021-04-14 ENCOUNTER — Other Ambulatory Visit: Payer: Self-pay

## 2021-04-14 DIAGNOSIS — D696 Thrombocytopenia, unspecified: Secondary | ICD-10-CM

## 2021-04-14 DIAGNOSIS — J4540 Moderate persistent asthma, uncomplicated: Secondary | ICD-10-CM

## 2021-04-14 DIAGNOSIS — K7460 Unspecified cirrhosis of liver: Secondary | ICD-10-CM

## 2021-04-14 MED ORDER — POTASSIUM CHLORIDE CRYS ER 20 MEQ PO TBCR: 40.0000 meq | EXTENDED_RELEASE_TABLET | Freq: Two times a day (BID) | ORAL | 0 refills | Status: DC

## 2021-04-14 MED ORDER — LEVOTHYROXINE SODIUM 100 MCG PO TABS: ORAL_TABLET | ORAL | 0 refills | Status: DC

## 2021-04-14 MED ORDER — LOSARTAN POTASSIUM-HCTZ 100-25 MG PO TABS: 1.0000 | ORAL_TABLET | Freq: Every day | ORAL | 0 refills | Status: DC

## 2021-04-14 MED ORDER — BUDESONIDE-FORMOTEROL FUMARATE 160-4.5 MCG/ACT IN AERO: 2.0000 | INHALATION_SPRAY | Freq: Two times a day (BID) | RESPIRATORY_TRACT | 5 refills | Status: DC

## 2021-04-15 NOTE — Telephone Encounter (Signed)
Tried calling patient to see about transferring meds to [facility]. No answer

## 2021-04-27 DIAGNOSIS — J45998 Other asthma: Secondary | ICD-10-CM | POA: Diagnosis not present

## 2021-05-05 ENCOUNTER — Other Ambulatory Visit: Payer: Self-pay | Admitting: Physician Assistant

## 2021-05-08 ENCOUNTER — Other Ambulatory Visit: Payer: Self-pay | Admitting: Physician Assistant

## 2021-05-12 DIAGNOSIS — G4733 Obstructive sleep apnea (adult) (pediatric): Secondary | ICD-10-CM | POA: Diagnosis not present

## 2021-05-18 DIAGNOSIS — R5383 Other fatigue: Secondary | ICD-10-CM | POA: Diagnosis not present

## 2021-05-18 DIAGNOSIS — I2721 Secondary pulmonary arterial hypertension: Secondary | ICD-10-CM | POA: Diagnosis not present

## 2021-05-18 DIAGNOSIS — J4540 Moderate persistent asthma, uncomplicated: Secondary | ICD-10-CM | POA: Diagnosis not present

## 2021-05-18 DIAGNOSIS — R911 Solitary pulmonary nodule: Secondary | ICD-10-CM | POA: Diagnosis not present

## 2021-05-18 DIAGNOSIS — G4733 Obstructive sleep apnea (adult) (pediatric): Secondary | ICD-10-CM | POA: Diagnosis not present

## 2021-05-20 ENCOUNTER — Ambulatory Visit (INDEPENDENT_AMBULATORY_CARE_PROVIDER_SITE_OTHER): Payer: PPO | Admitting: Physician Assistant

## 2021-05-20 ENCOUNTER — Encounter: Payer: Self-pay | Admitting: Physician Assistant

## 2021-05-20 ENCOUNTER — Other Ambulatory Visit: Payer: Self-pay

## 2021-05-20 VITALS — BP 122/72 | HR 90 | Temp 97.8°F | Ht 63.0 in | Wt 189.4 lb

## 2021-05-20 DIAGNOSIS — M25562 Pain in left knee: Secondary | ICD-10-CM | POA: Diagnosis not present

## 2021-05-20 DIAGNOSIS — E559 Vitamin D deficiency, unspecified: Secondary | ICD-10-CM | POA: Diagnosis not present

## 2021-05-20 DIAGNOSIS — G8929 Other chronic pain: Secondary | ICD-10-CM

## 2021-05-20 DIAGNOSIS — R300 Dysuria: Secondary | ICD-10-CM | POA: Diagnosis not present

## 2021-05-20 DIAGNOSIS — R911 Solitary pulmonary nodule: Secondary | ICD-10-CM | POA: Diagnosis not present

## 2021-05-20 DIAGNOSIS — I10 Essential (primary) hypertension: Secondary | ICD-10-CM | POA: Diagnosis not present

## 2021-05-20 DIAGNOSIS — Z1231 Encounter for screening mammogram for malignant neoplasm of breast: Secondary | ICD-10-CM | POA: Diagnosis not present

## 2021-05-20 DIAGNOSIS — D696 Thrombocytopenia, unspecified: Secondary | ICD-10-CM | POA: Diagnosis not present

## 2021-05-20 DIAGNOSIS — Z8619 Personal history of other infectious and parasitic diseases: Secondary | ICD-10-CM | POA: Diagnosis not present

## 2021-05-20 DIAGNOSIS — E039 Hypothyroidism, unspecified: Secondary | ICD-10-CM

## 2021-05-20 DIAGNOSIS — M25561 Pain in right knee: Secondary | ICD-10-CM

## 2021-05-20 LAB — POCT URINALYSIS DIP (CLINITEK)
Bilirubin, UA: NEGATIVE
Blood, UA: NEGATIVE
Glucose, UA: NEGATIVE mg/dL
Ketones, POC UA: NEGATIVE mg/dL
Nitrite, UA: NEGATIVE
Spec Grav, UA: 1.025 (ref 1.010–1.025)
Urobilinogen, UA: 1 E.U./dL
pH, UA: 6 (ref 5.0–8.0)

## 2021-05-20 MED ORDER — DOXYCYCLINE HYCLATE 100 MG PO TABS: 100.0000 mg | ORAL_TABLET | Freq: Two times a day (BID) | ORAL | 0 refills | Status: DC

## 2021-05-20 MED ORDER — OXYCODONE HCL 5 MG PO TABS: 5.0000 mg | ORAL_TABLET | Freq: Two times a day (BID) | ORAL | 0 refills | Status: DC | PRN

## 2021-05-20 NOTE — Progress Notes (Signed)
Subjective:  Patient ID: Natalie Perry, female    DOB: 11/20/1960  Age: 61 y.o. MRN: [id number]  Chief Complaint  Patient presents with   Hypertension   Hypothyroidism    HPI  Pt presents for follow up of hypertension. The patient is tolerating the medication well without side effects. Compliance with treatment has been good; including taking medication as directed , maintains a healthy diet and regular exercise regimen , and following up as directed. Pt taking norvasc 5mg , hyzaar 100/25  Pt with history of asthma/COPD - symptoms currently stable - uses ventolin and symbicort as directed  Pt with history of anxiety/depression - currently stable on celexa 20mg  qd and ativan as needed  Pt with history of low vitamin D - currently on weekly supplement and due for labwork  Pt takes oxycodone for chronic knee/leg pain - is not a candidate for surgery at this time and has been following with ortho  Pt with history of hypothyroidism- currently on 100mg  qd - due for labwork  Pt with history of of PE - currently on Eliquis 5mg  She is following with Dr Alcide Clever as well for her lungs - is due in February for CT lung  Pt due for mammogram - will schedule  Current Outpatient Medications on File Prior to Visit  Medication Sig Dispense Refill   albuterol (PROVENTIL) (2.5 MG/3ML) 0.083% nebulizer solution INHALE 1 VIAL VIA NEBULIZER 3 TO 4 TIMES A DAY AS DIRECTED 300 mL 3   albuterol (VENTOLIN HFA) 108 (90 Base) MCG/ACT inhaler INHALE 1-2 PUFFS into THE lungs BY MOUTH EVERY 4 TO 6 HOURS AS NEEDED 8.5 g 5   amLODipine (NORVASC) 5 MG tablet TAKE 1 TABLET BY MOUTH EVERY DAY 30 tablet 2   betamethasone dipropionate (DIPROLENE) 0.05 % ointment Apply topically daily as needed.     budesonide-formoterol (SYMBICORT) 160-4.5 MCG/ACT inhaler Inhale 2 puffs into the lungs 2 (two) times daily. 1 each 5   citalopram (CELEXA) 20 MG tablet Take 1 tablet (20 mg total) by mouth daily. 90 tablet 1   ELIQUIS 5 MG  TABS tablet TAKE 1 TABLET(5 MG) BY MOUTH TWICE DAILY 60 tablet 0   lactulose (CHRONULAC) 10 GM/15ML solution      levothyroxine (SYNTHROID) 100 MCG tablet TAKE ONE TABLET BY MOUTH EVERY MORNING 90 tablet 0   LORazepam (ATIVAN) 1 MG tablet TAKE 1 TABLET(1 MG) BY MOUTH THREE TIMES DAILY AS NEEDED FOR ANXIETY 90 tablet 1   losartan-hydrochlorothiazide (HYZAAR) 100-25 MG tablet Take 1 tablet by mouth daily. 90 tablet 0   montelukast (SINGULAIR) 10 MG tablet Take 10 mg by mouth daily.     potassium chloride SA (KLOR-CON M) 20 MEQ tablet Take 2 tablets (40 mEq total) by mouth 2 (two) times daily. 360 tablet 0   promethazine (PHENERGAN) 25 MG tablet Take 1 tablet (25 mg total) by mouth every 8 (eight) hours as needed for nausea or vomiting. 30 tablet 2   Vitamin D, Ergocalciferol, (DRISDOL) 1.25 MG (50000 UNIT) CAPS capsule TAKE 1 CAPSULE BY MOUTH EVERY 7 DAYS 12 capsule 1   No current facility-administered medications on file prior to visit.   Past Medical History:  Diagnosis Date   Arthritis    Asthma    Bipolar affective disorder ([place])    Cirrhosis of liver due to hepatitis C    Thyroid disease    Past Surgical History:  Procedure Laterality Date   ABDOMINAL HYSTERECTOMY     ABDOMINAL SURGERY  APPENDECTOMY     CHOLECYSTECTOMY      Family History  Problem Relation Age of Onset   Asthma Mother    Bipolar disorder Mother    CAD Father    Hypertension Father    Social History   Socioeconomic History   Marital status: Divorced    Spouse name: Not on file   Number of children: 2   Years of education: Not on file   Highest education level: Not on file  Occupational History   Occupation: disabled  Tobacco Use   Smoking status: Never   Smokeless tobacco: Never  Vaping Use   Vaping Use: Never used  Substance and Sexual Activity   Alcohol use: No   Drug use: Never   Sexual activity: Not on file  Other Topics Concern   Not on file  Social History Narrative   Not on file    Social Determinants of Health   Financial Resource Strain: Not on file  Food Insecurity: No Food Insecurity   Worried About Running Out of Food in the Last Year: Never true   [place] in the Last Year: Never true  Transportation Needs: No Transportation Needs   Lack of Transportation (Medical): No   Lack of Transportation (Non-Medical): No  Physical Activity: Not on file  Stress: Not on file  Social Connections: Not on file    Review of Systems CONSTITUTIONAL: Negative for chills, fatigue, fever, unintentional weight gain and unintentional weight loss.  E/N/T: Negative for ear pain, nasal congestion and sore throat.  CARDIOVASCULAR: Negative for chest pain, dizziness, palpitations and pedal edema.  RESPIRATORY: Negative for recent cough and dyspnea.  GASTROINTESTINAL: Negative for abdominal pain, acid reflux symptoms, constipation, diarrhea, nausea and vomiting.  MSK: Negative for arthralgias and myalgias.  GU - has some dysuria INTEGUMENTARY: Negative for rash.  NEUROLOGICAL: Negative for dizziness and headaches.  PSYCHIATRIC: Negative for sleep disturbance and to question depression screen.  Negative for depression, negative for anhedonia.       Objective:  BP 122/72 (BP Location: Right Arm, Patient Position: Sitting)    Pulse 90    Temp 97.8 F (36.6 C) (Temporal)    Ht 5\' 3"  (1.6 m)    Wt 189 lb 6.4 oz (85.9 kg)    SpO2 96%    BMI 33.55 kg/m   BP/Weight 05/20/2021 02/11/2021 7/84/6962  Systolic BP 952 841 324  Diastolic BP 72 82 62  Wt. (Lbs) 189.4 202 204.6  BMI 33.55 35.78 36.24    Physical Exam PHYSICAL EXAM:   VS: BP 122/72 (BP Location: Right Arm, Patient Position: Sitting)    Pulse 90    Temp 97.8 F (36.6 C) (Temporal)    Ht 5\' 3"  (1.6 m)    Wt 189 lb 6.4 oz (85.9 kg)    SpO2 96%    BMI 33.55 kg/m   GEN: Well nourished, well developed, in no acute distress  HEENT: normal external ears and nose - normal external auditory canals and TMS -  - Lips,  Teeth and Gums - normal  Oropharynx - normal mucosa, palate, and posterior pharynx Cardiac: RRR; no murmurs, rubs, or gallops,no edema -  Respiratory:  normal respiratory rate and pattern with no distress - normal breath sounds with no rales, rhonchi, wheezes or rubs GI: normal bowel sounds, no masses or tenderness MS: no deformity or atrophy  Skin: warm and dry, no rash  Neuro:  Alert and Oriented x 3, Strength and sensation are intact -  CN II-Xii grossly intact Psych: euthymic mood, appropriate affect and demeanor  Office Visit on 05/20/2021  Component Date Value Ref Range Status   Color, UA 05/20/2021 yellow  yellow Final   Clarity, UA 05/20/2021 clear  clear Final   Glucose, UA 05/20/2021 negative  negative mg/dL Final   Bilirubin, UA 05/20/2021 negative  negative Final   Ketones, POC UA 05/20/2021 negative  negative mg/dL Final   Spec Grav, UA 05/20/2021 1.025  1.010 - 1.025 Final   Blood, UA 05/20/2021 negative  negative Final   pH, UA 05/20/2021 6.0  5.0 - 8.0 Final   POC PROTEIN,UA 05/20/2021 trace  negative, trace Final   Urobilinogen, UA 05/20/2021 1.0  0.2 or 1.0 E.U./dL Final   Nitrite, UA 05/20/2021 Negative  Negative Final   Leukocytes, UA 05/20/2021 Trace (A)  Negative Final    Diabetic Foot Exam - Simple   No data filed      Lab Results  Component Value Date   WBC 5.8 02/11/2021   HGB 12.6 02/11/2021   HCT 36.7 02/11/2021   PLT 107 (L) 02/11/2021   GLUCOSE 99 02/11/2021   CHOL 102 02/11/2021   TRIG 55 02/11/2021   HDL 34 (L) 02/11/2021   LDLCALC 55 02/11/2021   ALT 19 02/11/2021   AST 35 02/11/2021   NA 140 02/11/2021   K 3.6 02/11/2021   CL 103 02/11/2021   CREATININE 0.68 02/11/2021   BUN 13 02/11/2021   CO2 20 02/11/2021   TSH 2.950 02/11/2021   INR 1.1 11/16/2006      Assessment & Plan:   Problem List Items Addressed This Visit       Cardiovascular and Mediastinum   Benign hypertension   Relevant Orders   CBC with  Differential/Platelet   Comprehensive metabolic panel   Lipid panel     Digestive   History of hepatitis C   Relevant Orders   Comprehensive metabolic panel     Endocrine   Hypothyroid   Relevant Orders   TSH     Hematopoietic and Hemostatic   Thrombocytopenia (HCC)   Relevant Orders   CBC with Differential/Platelet     Other   Vitamin D deficiency   Relevant Orders   VITAMIN D 25 Hydroxy (Vit-D Deficiency, Fractures)   Chronic pain of both knees   Relevant Medications   oxyCODONE (OXY IR/ROXICODONE) 5 MG immediate release tablet   Lung nodule   Other Visit Diagnoses     Dysuria    -  Primary   Relevant Orders   POCT URINALYSIS DIP (CLINITEK) (Completed)   Encounter for screening mammogram for breast cancer       Relevant Orders   MM DIGITAL SCREENING BILATERAL     .  Meds ordered this encounter  Medications   oxyCODONE (OXY IR/ROXICODONE) 5 MG immediate release tablet    Sig: Take 1 tablet (5 mg total) by mouth 2 (two) times daily as needed.    Dispense:  60 tablet    Refill:  0    Order Specific Question:   Supervising Provider    Answer:   Shelton Silvas   doxycycline (VIBRA-TABS) 100 MG tablet    Sig: Take 1 tablet (100 mg total) by mouth 2 (two) times daily.    Dispense:  20 tablet    Refill:  0    Order Specific Question:   Supervising Provider    AnswerShelton Silvas    Orders Placed This Encounter  Procedures   MM DIGITAL SCREENING BILATERAL   CBC with Differential/Platelet   Comprehensive metabolic panel   TSH   Lipid panel   VITAMIN D 25 Hydroxy (Vit-D Deficiency, Fractures)   POCT URINALYSIS DIP (CLINITEK)     Follow-up: Return in about 3 months (around 08/18/2021) for chronic fasting follow up.  An After Visit Summary was printed and given to the patient.  Yetta Flock [facility] [phone]

## 2021-05-21 LAB — CBC WITH DIFFERENTIAL/PLATELET
Basophils Absolute: 0 10*3/uL (ref 0.0–0.2)
Basos: 1 %
EOS (ABSOLUTE): 0.1 10*3/uL (ref 0.0–0.4)
Eos: 2 %
Hematocrit: 39.6 % (ref 34.0–46.6)
Hemoglobin: 13.5 g/dL (ref 11.1–15.9)
Immature Grans (Abs): 0 10*3/uL (ref 0.0–0.1)
Immature Granulocytes: 0 %
Lymphocytes Absolute: 0.9 10*3/uL (ref 0.7–3.1)
Lymphs: 15 %
MCH: 30.3 pg (ref 26.6–33.0)
MCHC: 34.1 g/dL (ref 31.5–35.7)
MCV: 89 fL (ref 79–97)
Monocytes Absolute: 0.5 10*3/uL (ref 0.1–0.9)
Monocytes: 9 %
Neutrophils Absolute: 4.5 10*3/uL (ref 1.4–7.0)
Neutrophils: 73 %
Platelets: 113 10*3/uL — ABNORMAL LOW (ref 150–450)
RBC: 4.46 x10E6/uL (ref 3.77–5.28)
RDW: 14.1 % (ref 11.7–15.4)
WBC: 6 10*3/uL (ref 3.4–10.8)

## 2021-05-21 LAB — COMPREHENSIVE METABOLIC PANEL
ALT: 17 IU/L (ref 0–32)
AST: 32 IU/L (ref 0–40)
Albumin/Globulin Ratio: 1.5 (ref 1.2–2.2)
Albumin: 4 g/dL (ref 3.8–4.8)
Alkaline Phosphatase: 71 IU/L (ref 44–121)
BUN/Creatinine Ratio: 17 (ref 12–28)
BUN: 13 mg/dL (ref 8–27)
Bilirubin Total: 0.9 mg/dL (ref 0.0–1.2)
CO2: 23 mmol/L (ref 20–29)
Calcium: 8.9 mg/dL (ref 8.7–10.3)
Chloride: 108 mmol/L — ABNORMAL HIGH (ref 96–106)
Creatinine, Ser: 0.75 mg/dL (ref 0.57–1.00)
Globulin, Total: 2.7 g/dL (ref 1.5–4.5)
Glucose: 103 mg/dL — ABNORMAL HIGH (ref 70–99)
Potassium: 3.9 mmol/L (ref 3.5–5.2)
Sodium: 144 mmol/L (ref 134–144)
Total Protein: 6.7 g/dL (ref 6.0–8.5)
eGFR: 91 mL/min/{1.73_m2} (ref >59)

## 2021-05-21 LAB — LIPID PANEL
Chol/HDL Ratio: 2.6 ratio (ref 0.0–4.4)
Cholesterol, Total: 111 mg/dL (ref 100–199)
HDL: 42 mg/dL (ref >39)
LDL Chol Calc (NIH): 58 mg/dL (ref 0–99)
Triglycerides: 42 mg/dL (ref 0–149)
VLDL Cholesterol Cal: 11 mg/dL (ref 5–40)

## 2021-05-21 LAB — CARDIOVASCULAR RISK ASSESSMENT

## 2021-05-21 LAB — TSH: TSH: 3.55 u[IU]/mL (ref 0.450–4.500)

## 2021-05-21 LAB — VITAMIN D 25 HYDROXY (VIT D DEFICIENCY, FRACTURES): Vit D, 25-Hydroxy: 60.8 ng/mL (ref 30.0–100.0)

## 2021-05-28 DIAGNOSIS — J45998 Other asthma: Secondary | ICD-10-CM | POA: Diagnosis not present

## 2021-06-01 ENCOUNTER — Telehealth: Payer: Self-pay | Admitting: Physician Assistant

## 2021-06-01 NOTE — Telephone Encounter (Signed)
Mammogram is scheduled at [facility] for 06/22/21

## 2021-06-04 ENCOUNTER — Telehealth: Payer: Self-pay

## 2021-06-04 NOTE — Chronic Care Management (AMB) (Signed)
Chronic Care Management Pharmacy Assistant   Name: Natalie Perry  MRN: [id number] DOB: [birth year]  Reason for Encounter: Disease State/ Hypertension  Recent office visits:  05-20-2020 [name], PA-C. Platelets= 113. Glucose= 103, Chloride= 108. Abnormal UA. START Doxycycline 100 mg twice daily for 10 days. STOP nitrofurantoin and nystatin.  Recent consult visits:  None  Hospital visits:  None in previous 6 months  Medications: Outpatient Encounter Medications as of 06/04/2021  Medication Sig   albuterol (PROVENTIL) (2.5 MG/3ML) 0.083% nebulizer solution INHALE 1 VIAL VIA NEBULIZER 3 TO 4 TIMES A DAY AS DIRECTED   albuterol (VENTOLIN HFA) 108 (90 Base) MCG/ACT inhaler INHALE 1-2 PUFFS into THE lungs BY MOUTH EVERY 4 TO 6 HOURS AS NEEDED   amLODipine (NORVASC) 5 MG tablet TAKE 1 TABLET BY MOUTH EVERY DAY   betamethasone dipropionate (DIPROLENE) 0.05 % ointment Apply topically daily as needed.   budesonide-formoterol (SYMBICORT) 160-4.5 MCG/ACT inhaler Inhale 2 puffs into the lungs 2 (two) times daily.   citalopram (CELEXA) 20 MG tablet Take 1 tablet (20 mg total) by mouth daily.   doxycycline (VIBRA-TABS) 100 MG tablet Take 1 tablet (100 mg total) by mouth 2 (two) times daily.   ELIQUIS 5 MG TABS tablet TAKE 1 TABLET(5 MG) BY MOUTH TWICE DAILY   lactulose (CHRONULAC) 10 GM/15ML solution    levothyroxine (SYNTHROID) 100 MCG tablet TAKE ONE TABLET BY MOUTH EVERY MORNING   LORazepam (ATIVAN) 1 MG tablet TAKE 1 TABLET(1 MG) BY MOUTH THREE TIMES DAILY AS NEEDED FOR ANXIETY   losartan-hydrochlorothiazide (HYZAAR) 100-25 MG tablet Take 1 tablet by mouth daily.   montelukast (SINGULAIR) 10 MG tablet Take 10 mg by mouth daily.   oxyCODONE (OXY IR/ROXICODONE) 5 MG immediate release tablet Take 1 tablet (5 mg total) by mouth 2 (two) times daily as needed.   potassium chloride SA (KLOR-CON M) 20 MEQ tablet Take 2 tablets (40 mEq total) by mouth 2 (two) times daily.   promethazine  (PHENERGAN) 25 MG tablet Take 1 tablet (25 mg total) by mouth every 8 (eight) hours as needed for nausea or vomiting.   Vitamin D, Ergocalciferol, (DRISDOL) 1.25 MG (50000 UNIT) CAPS capsule TAKE 1 CAPSULE BY MOUTH EVERY 7 DAYS   No facility-administered encounter medications on file as of 06/04/2021.   Recent Office Vitals: BP Readings from Last 3 Encounters:  05/20/21 122/72  02/11/21 130/82  01/07/21 102/62   Pulse Readings from Last 3 Encounters:  05/20/21 90  02/11/21 99  01/07/21 90    Wt Readings from Last 3 Encounters:  05/20/21 189 lb 6.4 oz (85.9 kg)  02/11/21 202 lb (91.6 kg)  01/07/21 204 lb 9.6 oz (92.8 kg)     Kidney Function Lab Results  Component Value Date/Time   CREATININE 0.75 05/20/2021 10:52 AM   CREATININE 0.68 02/11/2021 10:32 AM   CREATININE 0.65 12/13/2016 01:06 PM   CREATININE 0.72 07/05/2016 12:42 PM   GFRNONAA 91 04/23/2020 10:30 AM   GFRAA 105 04/23/2020 10:30 AM    BMP Latest Ref Rng & Units 05/20/2021 02/11/2021 11/18/2020  Glucose 70 - 99 mg/dL 103(H) 99 113(H)  BUN 8 - 27 mg/dL 13 13 16   Creatinine 0.57 - 1.00 mg/dL 0.75 0.68 0.70  BUN/Creat Ratio 12 - 28 17 19 23   Sodium 134 - 144 mmol/L 144 140 136  Potassium 3.5 - 5.2 mmol/L 3.9 3.6 3.7  Chloride 96 - 106 mmol/L 108(H) 103 97  CO2 20 - 29 mmol/L 23 20 21  Calcium 8.7 - 10.3 mg/dL 8.9 8.7 9.3    06-04-2021: 1st attempt left VM 06-08-2021: 2nd attempt left VM 06-09-2021: 3rd attempt left VM  Care Gaps: Last annual wellness visit? None Shingrix overdue Covid Booster overdue  Star Rating Drugs: Losartan/HCTZ 100-25 mg- Last filled 04-16-2021 90 DS  [place] Clinical Pharmacist Assistant [phone]

## 2021-06-11 ENCOUNTER — Other Ambulatory Visit: Payer: Self-pay | Admitting: Physician Assistant

## 2021-06-12 ENCOUNTER — Other Ambulatory Visit: Payer: Self-pay | Admitting: Physician Assistant

## 2021-06-12 ENCOUNTER — Other Ambulatory Visit: Payer: Self-pay

## 2021-06-12 MED ORDER — ELIQUIS 5 MG PO TABS: ORAL_TABLET | ORAL | 5 refills | Status: DC

## 2021-06-12 MED ORDER — ELIQUIS 5 MG PO TABS: ORAL_TABLET | ORAL | 0 refills | Status: DC

## 2021-06-21 ENCOUNTER — Other Ambulatory Visit: Payer: Self-pay | Admitting: Physician Assistant

## 2021-06-22 DIAGNOSIS — Z1231 Encounter for screening mammogram for malignant neoplasm of breast: Secondary | ICD-10-CM | POA: Diagnosis not present

## 2021-06-22 LAB — HM MAMMOGRAPHY

## 2021-06-28 DIAGNOSIS — J45998 Other asthma: Secondary | ICD-10-CM | POA: Diagnosis not present

## 2021-07-03 DIAGNOSIS — R911 Solitary pulmonary nodule: Secondary | ICD-10-CM | POA: Diagnosis not present

## 2021-07-10 ENCOUNTER — Other Ambulatory Visit: Payer: Self-pay | Admitting: Physician Assistant

## 2021-07-10 ENCOUNTER — Telehealth: Payer: Self-pay

## 2021-07-10 DIAGNOSIS — D696 Thrombocytopenia, unspecified: Secondary | ICD-10-CM

## 2021-07-10 DIAGNOSIS — K7460 Unspecified cirrhosis of liver: Secondary | ICD-10-CM

## 2021-07-10 MED ORDER — LEVOTHYROXINE SODIUM 100 MCG PO TABS: ORAL_TABLET | ORAL | 0 refills | Status: DC

## 2021-07-10 MED ORDER — POTASSIUM CHLORIDE CRYS ER 20 MEQ PO TBCR: 40.0000 meq | EXTENDED_RELEASE_TABLET | Freq: Two times a day (BID) | ORAL | 0 refills | Status: DC

## 2021-07-10 MED ORDER — LOSARTAN POTASSIUM-HCTZ 100-25 MG PO TABS: 1.0000 | ORAL_TABLET | Freq: Every day | ORAL | 0 refills | Status: DC

## 2021-07-10 NOTE — Telephone Encounter (Signed)
-----   Message from Eulis Canner sent at 07/10/2021 11:08 AM EST ----- ?Regarding: Refills ?We need refills on the following sent to [facility]  ? ?Levothyroxine 154mcg ?Potassium Chloride 73meq ?Losartan-HCTZ 100-25mg   ? ? ? ?Elray Mcgregor, CMA ?Clinical Pharmacist Assistant  ?[phone]  ? ?

## 2021-07-10 NOTE — Chronic Care Management (AMB) (Signed)
? ? ?Chronic Care Management ?Pharmacy Assistant  ? ?Name: Natalie Perry  MRN: [id number] DOB: [birth year] ? ? ?Reason for Encounter: Medication Coordination for [facility]  ?  ?Recent office visits:  ?None ? ?Recent consult visits:  ?None ? ?Hospital visits:  ?None ? ?Medications: ?Outpatient Encounter Medications as of 07/10/2021  ?Medication Sig  ? albuterol (PROVENTIL) (2.5 MG/3ML) 0.083% nebulizer solution INHALE 1 VIAL VIA NEBULIZER 3 TO 4 TIMES A DAY AS DIRECTED  ? albuterol (VENTOLIN HFA) 108 (90 Base) MCG/ACT inhaler INHALE 1-2 PUFFS into THE lungs BY MOUTH EVERY 4 TO 6 HOURS AS NEEDED  ? amLODipine (NORVASC) 5 MG tablet TAKE 1 TABLET BY MOUTH EVERY DAY  ? betamethasone dipropionate (DIPROLENE) 0.05 % ointment Apply topically daily as needed.  ? budesonide-formoterol (SYMBICORT) 160-4.5 MCG/ACT inhaler Inhale 2 puffs into the lungs 2 (two) times daily.  ? citalopram (CELEXA) 20 MG tablet Take 1 tablet (20 mg total) by mouth daily.  ? doxycycline (VIBRA-TABS) 100 MG tablet Take 1 tablet (100 mg total) by mouth 2 (two) times daily.  ? ELIQUIS 5 MG TABS tablet TAKE 1 TABLET(5 MG) BY MOUTH TWICE DAILY  ? lactulose (CHRONULAC) 10 GM/15ML solution   ? levothyroxine (SYNTHROID) 100 MCG tablet TAKE ONE TABLET BY MOUTH EVERY MORNING  ? LORazepam (ATIVAN) 1 MG tablet TAKE 1 TABLET(1 MG) BY MOUTH THREE TIMES DAILY AS NEEDED FOR ANXIETY  ? losartan-hydrochlorothiazide (HYZAAR) 100-25 MG tablet Take 1 tablet by mouth daily.  ? montelukast (SINGULAIR) 10 MG tablet Take 10 mg by mouth daily.  ? oxyCODONE (OXY IR/ROXICODONE) 5 MG immediate release tablet Take 1 tablet (5 mg total) by mouth 2 (two) times daily as needed.  ? potassium chloride SA (KLOR-CON M) 20 MEQ tablet Take 2 tablets (40 mEq total) by mouth 2 (two) times daily.  ? promethazine (PHENERGAN) 25 MG tablet Take 1 tablet (25 mg total) by mouth every 8 (eight) hours as needed for nausea or vomiting.  ? Vitamin D, Ergocalciferol, (DRISDOL) 1.25 MG (50000 UNIT) CAPS  capsule TAKE 1 CAPSULE BY MOUTH EVERY 7 DAYS  ? ?No facility-administered encounter medications on file as of 07/10/2021.  ? ? ?Reviewed chart for medication changes ahead of medication coordination call. ? ?No OVs, Consults, or hospital visits since last care coordination call/Pharmacist visit.  ? ?No medication changes indicated OR if recent visit, treatment plan here. ? ?BP Readings from Last 3 Encounters:  ?05/20/21 122/72  ?02/11/21 130/82  ?01/07/21 102/62  ?  ?No results found for: HGBA1C  ? ?Patient obtains medications through Vials  90 Days  ? ?Last adherence delivery included:  ?Potassium Chloride 65meq 2 tabs twice daily ?Levothyroxine 171mcg 1 tab every morning ?Losartan-HCTZ 100-25mg  1 tab once daily ?Budesonide-Formot Inhaler 160-4.5 2 puffs twice daily ?Albuterol Inhaler 91mcg 1-2 puffs every 4-6 hours prn  ? ?Patient declined (meds) last delivery ?Potassium Chloride 77meq 2 tabs twice daily ?Levothyroxine 165mcg 1 tab every morning ?Losartan-HCTZ 100-25mg  1 tab once daily ?Budesonide-Formot Inhaler 160-4.5 2 puffs twice daily ? ?Patient is due for next adherence delivery on: 07/22/21. ?Called patient and reviewed medications and coordinated delivery. ? ?This delivery to include: ?Potassium Chloride 28meq 2 tabs twice daily ?Levothyroxine 173mcg 1 tab every morning ?Losartan-HCTZ 100-25mg  1 tab once daily ?Budesonide-Formot Inhaler 160-4.5 2 puffs twice daily ?Albuterol Inhaler 73mcg 1-2 puffs every 4-6 hours prn  ? ?Patient declined the following medications  ?Amlodipine 5mg - Gets at [facility]  ?Eliquis 5mg - Wants to get at [facility] ?Lorazepam 1mg -Wants to get  at [facility] ?Vitamin D- Gets at [facility] ?Citalopram 20mg - Gets at [facility]  ? ?I asked the pt why she doesn't want [place] to fill the RX she gets at [facility] and she stated its right down the road from her and she just prefers to get them there for now / ? ?Patient needs refills  ?Levothyroxine 121mcg ?Potassium Chloride 64meq ?Losartan-HCTZ  100-25mg   ? ? ?Confirmed delivery date of 07/22/21, advised patient that pharmacy will contact them the morning of delivery ? ? ?Elray Mcgregor, CMA ?Clinical Pharmacist Assistant  ?[phone] ?

## 2021-07-13 DIAGNOSIS — R911 Solitary pulmonary nodule: Secondary | ICD-10-CM | POA: Diagnosis not present

## 2021-07-13 DIAGNOSIS — R5383 Other fatigue: Secondary | ICD-10-CM | POA: Diagnosis not present

## 2021-07-13 DIAGNOSIS — G4733 Obstructive sleep apnea (adult) (pediatric): Secondary | ICD-10-CM | POA: Diagnosis not present

## 2021-07-13 DIAGNOSIS — I2721 Secondary pulmonary arterial hypertension: Secondary | ICD-10-CM | POA: Diagnosis not present

## 2021-07-13 DIAGNOSIS — J4540 Moderate persistent asthma, uncomplicated: Secondary | ICD-10-CM | POA: Diagnosis not present

## 2021-07-13 NOTE — Telephone Encounter (Signed)
Patient getting meds from two different pharmacies, would like to get all at one of patient's preference but she prefers it this way.  ?

## 2021-07-20 ENCOUNTER — Other Ambulatory Visit: Payer: Self-pay | Admitting: Physician Assistant

## 2021-07-22 ENCOUNTER — Ambulatory Visit (INDEPENDENT_AMBULATORY_CARE_PROVIDER_SITE_OTHER): Payer: PPO

## 2021-07-22 DIAGNOSIS — Z1211 Encounter for screening for malignant neoplasm of colon: Secondary | ICD-10-CM | POA: Diagnosis not present

## 2021-07-22 DIAGNOSIS — Z0000 Encounter for general adult medical examination without abnormal findings: Secondary | ICD-10-CM | POA: Diagnosis not present

## 2021-07-23 NOTE — Progress Notes (Signed)
? ?Subjective:  ? Natalie Perry is a 61 y.o. female who presents for Medicare Annual (Subsequent) preventive examination. ? ?I connected with  Natalie Perry on 07/23/21 by a audio enabled telemedicine application and verified that I am speaking with the correct person using two identifiers. ? ?Patient Location: Home ? ?Provider Location: Office/Clinic ? ?I discussed the limitations of evaluation and management by telemedicine. The patient expressed understanding and agreed to proceed.  ? ? ?Cardiac Risk Factors include: sedentary lifestyle;obesity (BMI >30kg/m2) ? ?   ?Objective:  ?  ?There were no vitals filed for this visit. ?There is no height or weight on file to calculate BMI. ? ?Advanced Directives 08/08/2020 12/13/2016 07/05/2016  ?Does Patient Have a Medical Advance Directive? Yes No Yes  ?Type of Paramedic of [place];Living will - [place];Living will  ?Copy of [place] in Chart? - - No - copy requested  ? ? ?Current Medications (verified) ?Outpatient Encounter Medications as of 07/22/2021  ?Medication Sig  ? albuterol (PROVENTIL) (2.5 MG/3ML) 0.083% nebulizer solution INHALE 1 VIAL VIA NEBULIZER 3 TO 4 TIMES A DAY AS DIRECTED  ? albuterol (VENTOLIN HFA) 108 (90 Base) MCG/ACT inhaler INHALE 1-2 PUFFS into THE lungs BY MOUTH EVERY 4 TO 6 HOURS AS NEEDED  ? amLODipine (NORVASC) 5 MG tablet TAKE 1 TABLET BY MOUTH EVERY DAY  ? betamethasone dipropionate (DIPROLENE) 0.05 % ointment Apply topically daily as needed.  ? budesonide-formoterol (SYMBICORT) 160-4.5 MCG/ACT inhaler Inhale 2 puffs into the lungs 2 (two) times daily.  ? ELIQUIS 5 MG TABS tablet TAKE 1 TABLET(5 MG) BY MOUTH TWICE DAILY  ? levothyroxine (SYNTHROID) 100 MCG tablet TAKE ONE TABLET BY MOUTH EVERY MORNING  ? LORazepam (ATIVAN) 1 MG tablet TAKE 1 TABLET(1 MG) BY MOUTH THREE TIMES DAILY AS NEEDED FOR ANXIETY  ? losartan-hydrochlorothiazide (HYZAAR) 100-25 MG tablet Take 1 tablet  by mouth daily.  ? montelukast (SINGULAIR) 10 MG tablet Take 10 mg by mouth daily.  ? oxyCODONE (OXY IR/ROXICODONE) 5 MG immediate release tablet Take 1 tablet (5 mg total) by mouth 2 (two) times daily as needed.  ? potassium chloride SA (KLOR-CON M) 20 MEQ tablet Take 2 tablets (40 mEq total) by mouth 2 (two) times daily.  ? promethazine (PHENERGAN) 25 MG tablet Take 1 tablet (25 mg total) by mouth every 8 (eight) hours as needed for nausea or vomiting.  ? Vitamin D, Ergocalciferol, (DRISDOL) 1.25 MG (50000 UNIT) CAPS capsule TAKE 1 CAPSULE BY MOUTH EVERY 7 DAYS  ? citalopram (CELEXA) 20 MG tablet Take 1 tablet (20 mg total) by mouth daily. (Patient not taking: Reported on 07/22/2021)  ? doxycycline (VIBRA-TABS) 100 MG tablet Take 1 tablet (100 mg total) by mouth 2 (two) times daily. (Patient not taking: Reported on 07/22/2021)  ? lactulose (CHRONULAC) 10 GM/15ML solution  (Patient not taking: Reported on 07/22/2021)  ? ?No facility-administered encounter medications on file as of 07/22/2021.  ? ? ?Allergies (verified) ?Penicillins and Nicotine  ? ?History: ?Past Medical History:  ?Diagnosis Date  ? Arthritis   ? Asthma   ? Bipolar affective disorder ([place])   ? Cirrhosis of liver due to hepatitis C   ? Thyroid disease   ? ?Past Surgical History:  ?Procedure Laterality Date  ? ABDOMINAL HYSTERECTOMY    ? ABDOMINAL SURGERY    ? APPENDECTOMY    ? CHOLECYSTECTOMY    ? ?Family History  ?Problem Relation Age of Onset  ? Asthma Mother   ?  Bipolar disorder Mother   ? CAD Father   ? Hypertension Father   ? ?Social History  ? ?Socioeconomic History  ? Marital status: Divorced  ?  Spouse name: Not on file  ? Number of children: 2  ?Occupational History  ? Occupation: disabled  ?Tobacco Use  ? Smoking status: Never  ? Smokeless tobacco: Never  ?Vaping Use  ? Vaping Use: Never used  ?Substance and Sexual Activity  ? Alcohol use: No  ? Drug use: Never  ? Sexual activity: Not on file  ? ?Social Determinants of Health  ? ?Financial  Resource Strain: Not on file  ?Food Insecurity: Not on file  ?Transportation Needs: Not on file  ?Physical Activity: Insufficiently Active  ? Days of Exercise per Week: 2 days  ? Minutes of Exercise per Session: 30 min  ?Stress: Not on file  ?Social Connections: Not on file  ? ? ?Tobacco Counseling ?Counseling given: Patient does not use tobacco products ? ? ?Clinical Intake: ? ?Pre-visit preparation completed: Yes ? ?Pain : No/denies pain ?  ?BMI - recorded: 33.56 ?Nutritional Status: BMI > 30  Obese ?Diabetes: No ?How often do you need to have someone help you when you read instructions, pamphlets, or other written materials from your doctor or pharmacy?: 2 - Rarely ?Interpreter Needed?: No ?  ? ?Activities of Daily Living ?In your present state of health, do you have any difficulty performing the following activities: 07/23/2021  ?Hearing? N  ?Vision? N  ?Difficulty concentrating or making decisions? N  ?Walking or climbing stairs? Y  ?Dressing or bathing? N  ?Doing errands, shopping? N  ?Preparing Food and eating ? N  ?Using the Toilet? N  ?In the past six months, have you accidently leaked urine? N  ?Do you have problems with loss of bowel control? N  ?Managing your Medications? Y  ?Managing your Finances? N  ?Housekeeping or managing your Housekeeping? Y  ?Some recent data might be hidden  ? ? ?Patient Care Team: ?Eliot Ford as PCP - General (Physician Assistant) ?Burnice Logan, [place] (Inactive) as Pharmacist (Pharmacist) ?[name], MD as Consulting Physician (Oncology) ?[name], MD as Referring Physician (Pulmonary Disease) ? ?Indicate any recent Medical Services you may have received from other than [facility] providers in the past year (date may be approximate). ? ?   ?Assessment:  ? This is a routine wellness examination for Natalie Perry. ? ?Hearing/Vision screen ?Last eye exam at [facility] ? ?Dietary issues and exercise activities discussed: ?Current Exercise Habits: Structured exercise class,  Type of exercise: Other - see comments (water aerobics), Time (Minutes): 30, Frequency (Times/Week): 2, Weekly Exercise (Minutes/Week): 60, Intensity: Mild, Exercise limited by: respiratory conditions(s) ? ?Depression Screen ?PHQ 2/9 Scores 07/23/2021 01/07/2021 05/22/2020 04/23/2020  ?PHQ - 2 Score 0 0 0 0  ?  ?Fall Risk ?Fall Risk  07/23/2021 01/07/2021  ?Falls in the past year? 0 0  ?Number falls in past yr: 0 0  ?Injury with Fall? 0 0  ?Risk for fall due to : Impaired balance/gait -  ?Follow up Falls evaluation completed;Education provided;Falls prevention discussed -  ? ? ?FALL RISK PREVENTION PERTAINING TO THE HOME: ? ?Any stairs in or around the home? Yes  patient uses a ramp ?If so, are there any without handrails? No  ?Home free of loose throw rugs in walkways, pet beds, electrical cords, etc? Yes  ?Adequate lighting in your home to reduce risk of falls? Yes  ? ?ASSISTIVE DEVICES UTILIZED TO PREVENT FALLS: ? ?  Life alert? Yes  ?Use of a cane, walker or w/c? Yes  ?Grab bars in the bathroom? Yes  ?Shower chair or bench in shower? Yes  ? ? ?Cognitive Function: ?  ?  ?6CIT Screen 07/23/2021  ?What Year? 0 points  ?What month? 0 points  ?What time? 0 points  ?Count back from 20 0 points  ?Months in reverse 2 points  ?Repeat phrase 2 points  ?Total Score 4  ? ? ?Immunizations ?Immunization History  ?Administered Date(s) Administered  ? Influenza Inj Mdck Quad Pf 01/16/2020, 01/07/2021  ? Influenza-Unspecified 02/08/2019  ? PFIZER(Purple Top)SARS-COV-2 Vaccination 08/10/2019, 08/31/2019, 06/26/2020  ? Pneumococcal Conjugate-13 03/18/2014  ? Pneumococcal Polysaccharide-23 01/16/2020  ? Pneumococcal-Unspecified 04/06/2011  ? Tdap 01/31/2012  ? ? ?TDAP status: Up to date ? ?Flu Vaccine status: Up to date ? ?Pneumococcal vaccine status: Up to date ? ?Covid-19 vaccine status: Information provided on how to obtain vaccines.  ? ? ?Screening Tests ?Health Maintenance  ?Topic Date Due  ? Zoster Vaccines- Shingrix (1 of 2) Never  done  ? COVID-19 Vaccine (4 - Booster for Pfizer series) 08/21/2020  ? Fecal DNA (Cologuard)  09/10/2021  ? TETANUS/TDAP  01/30/2022  ? MAMMOGRAM  06/22/2022  ? INFLUENZA VACCINE  Completed  ? Hepatitis C

## 2021-07-23 NOTE — Patient Instructions (Signed)

## 2021-07-23 NOTE — Addendum Note (Signed)
Addended by: [name] on: 07/23/2021 11:04 AM ? ? Modules accepted: Orders ? ?

## 2021-07-26 DIAGNOSIS — J45998 Other asthma: Secondary | ICD-10-CM | POA: Diagnosis not present

## 2021-07-27 ENCOUNTER — Other Ambulatory Visit: Payer: Self-pay | Admitting: Physician Assistant

## 2021-07-27 ENCOUNTER — Telehealth: Payer: Self-pay

## 2021-07-27 MED ORDER — SYMBICORT 160-4.5 MCG/ACT IN AERO
2.0000 | INHALATION_SPRAY | Freq: Two times a day (BID) | RESPIRATORY_TRACT | 12 refills | Status: DC
Start: 2021-07-27 — End: 2022-10-05

## 2021-07-27 NOTE — Chronic Care Management (AMB) (Signed)
? ? ?  Chronic Care Management ?Pharmacy Assistant  ? ?Name: Natalie Perry  MRN: [id number] DOB: [birth year] ? ?Reason for Encounter: Prior Authorization Coordination ? ?07/27/2021- Prior authorization created in CoverMymeds for Symbicort, awaiting determination.  Received determination, PA not required for Symbicort, brand name will be covered. Sent request to [facility] team to for a brand name prescription to be sent to patient pharmacy.  ? ?Medications: ?Outpatient Encounter Medications as of 07/27/2021  ?Medication Sig  ? albuterol (PROVENTIL) (2.5 MG/3ML) 0.083% nebulizer solution INHALE 1 VIAL VIA NEBULIZER 3 TO 4 TIMES A DAY AS DIRECTED  ? albuterol (VENTOLIN HFA) 108 (90 Base) MCG/ACT inhaler INHALE 1-2 PUFFS into THE lungs BY MOUTH EVERY 4 TO 6 HOURS AS NEEDED  ? amLODipine (NORVASC) 5 MG tablet TAKE 1 TABLET BY MOUTH EVERY DAY  ? betamethasone dipropionate (DIPROLENE) 0.05 % ointment Apply topically daily as needed.  ? budesonide-formoterol (SYMBICORT) 160-4.5 MCG/ACT inhaler Inhale 2 puffs into the lungs 2 (two) times daily.  ? citalopram (CELEXA) 20 MG tablet Take 1 tablet (20 mg total) by mouth daily. (Patient not taking: Reported on 07/22/2021)  ? doxycycline (VIBRA-TABS) 100 MG tablet Take 1 tablet (100 mg total) by mouth 2 (two) times daily. (Patient not taking: Reported on 07/22/2021)  ? ELIQUIS 5 MG TABS tablet TAKE 1 TABLET(5 MG) BY MOUTH TWICE DAILY  ? lactulose (CHRONULAC) 10 GM/15ML solution  (Patient not taking: Reported on 07/22/2021)  ? levothyroxine (SYNTHROID) 100 MCG tablet TAKE ONE TABLET BY MOUTH EVERY MORNING  ? LORazepam (ATIVAN) 1 MG tablet TAKE 1 TABLET(1 MG) BY MOUTH THREE TIMES DAILY AS NEEDED FOR ANXIETY  ? losartan-hydrochlorothiazide (HYZAAR) 100-25 MG tablet Take 1 tablet by mouth daily.  ? montelukast (SINGULAIR) 10 MG tablet Take 10 mg by mouth daily.  ? oxyCODONE (OXY IR/ROXICODONE) 5 MG immediate release tablet Take 1 tablet (5 mg total) by mouth 2 (two) times daily as needed.  ?  potassium chloride SA (KLOR-CON M) 20 MEQ tablet Take 2 tablets (40 mEq total) by mouth 2 (two) times daily.  ? promethazine (PHENERGAN) 25 MG tablet Take 1 tablet (25 mg total) by mouth every 8 (eight) hours as needed for nausea or vomiting.  ? Vitamin D, Ergocalciferol, (DRISDOL) 1.25 MG (50000 UNIT) CAPS capsule TAKE 1 CAPSULE BY MOUTH EVERY 7 DAYS  ? ?No facility-administered encounter medications on file as of 07/27/2021.  ? ? ?Pattricia Boss, CMA ?Clinical Pharmacist Assistant ?[phone] ? ?

## 2021-07-29 DIAGNOSIS — R161 Splenomegaly, not elsewhere classified: Secondary | ICD-10-CM | POA: Diagnosis not present

## 2021-07-29 DIAGNOSIS — K7460 Unspecified cirrhosis of liver: Secondary | ICD-10-CM | POA: Diagnosis not present

## 2021-07-29 DIAGNOSIS — R911 Solitary pulmonary nodule: Secondary | ICD-10-CM | POA: Diagnosis not present

## 2021-07-29 DIAGNOSIS — K766 Portal hypertension: Secondary | ICD-10-CM | POA: Diagnosis not present

## 2021-08-03 DIAGNOSIS — I2721 Secondary pulmonary arterial hypertension: Secondary | ICD-10-CM | POA: Diagnosis not present

## 2021-08-03 DIAGNOSIS — J4540 Moderate persistent asthma, uncomplicated: Secondary | ICD-10-CM | POA: Diagnosis not present

## 2021-08-03 DIAGNOSIS — R5383 Other fatigue: Secondary | ICD-10-CM | POA: Diagnosis not present

## 2021-08-03 DIAGNOSIS — G4733 Obstructive sleep apnea (adult) (pediatric): Secondary | ICD-10-CM | POA: Diagnosis not present

## 2021-08-03 DIAGNOSIS — R911 Solitary pulmonary nodule: Secondary | ICD-10-CM | POA: Diagnosis not present

## 2021-08-04 ENCOUNTER — Encounter: Payer: Self-pay | Admitting: Specialist

## 2021-08-07 ENCOUNTER — Inpatient Hospital Stay: Payer: PPO | Attending: Oncology

## 2021-08-07 ENCOUNTER — Inpatient Hospital Stay: Payer: PPO | Admitting: Oncology

## 2021-08-07 ENCOUNTER — Other Ambulatory Visit: Payer: Self-pay | Admitting: Physician Assistant

## 2021-08-07 ENCOUNTER — Other Ambulatory Visit: Payer: Self-pay | Admitting: Oncology

## 2021-08-07 VITALS — BP 138/72 | HR 79 | Temp 98.3°F | Resp 16 | Ht 63.0 in | Wt 193.9 lb

## 2021-08-07 DIAGNOSIS — D696 Thrombocytopenia, unspecified: Secondary | ICD-10-CM

## 2021-08-07 DIAGNOSIS — D649 Anemia, unspecified: Secondary | ICD-10-CM | POA: Diagnosis not present

## 2021-08-07 LAB — CBC: RBC: 4.35 (ref 3.87–5.11)

## 2021-08-07 LAB — CBC AND DIFFERENTIAL
HCT: 39 (ref 36–46)
Hemoglobin: 12.8 (ref 12.0–16.0)
Neutrophils Absolute: 3.65
Platelets: 91 10*3/uL — AB (ref 150–400)
WBC: 4.8

## 2021-08-07 LAB — BASIC METABOLIC PANEL
BUN: 12 (ref 4–21)
CO2: 25 — AB (ref 13–22)
Chloride: 109 — AB (ref 99–108)
Creatinine: 0.5 (ref 0.5–1.1)
Glucose: 99
Potassium: 3.6 mEq/L (ref 3.5–5.1)
Sodium: 140 (ref 137–147)

## 2021-08-07 LAB — HEPATIC FUNCTION PANEL
ALT: 25 U/L (ref 7–35)
AST: 37 — AB (ref 13–35)
Alkaline Phosphatase: 59 (ref 25–125)
Bilirubin, Total: 1

## 2021-08-07 LAB — COMPREHENSIVE METABOLIC PANEL
Albumin: 3.8 (ref 3.5–5.0)
Calcium: 8.7 (ref 8.7–10.7)

## 2021-08-15 NOTE — Progress Notes (Signed)
?[facility]  ?[address] ?[place],  [place]  [address] ?[phone] ? ?Clinic Day:  08/07/2021 ? ?Referring physician: [name], PA-C ? ?HISTORY OF PRESENT ILLNESS:  ?The patient is a 61 y.o. 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  Labs dating back to 2011 have shown her platelet count consistently around 100K.  As she has not been severely thrombocytopenic, her platelets have been followed conservatively.  She comes in today for routine follow-up.  Since her last visit, the patient has been doing well.  She denies having any subcutaneous bruising/bleeding issues which concern her for a declining platelet count. ? ?PHYSICAL EXAM:  ?Blood pressure 138/72, pulse 79, temperature 98.3 ?F (36.8 ?C), resp. rate 16, height 5\' 3"  (1.6 m), weight 193 lb 14.4 oz (88 kg), SpO2 96 %. ?Wt Readings from Last 3 Encounters:  ?08/07/21 193 lb 14.4 oz (88 kg)  ?05/20/21 189 lb 6.4 oz (85.9 kg)  ?02/11/21 202 lb (91.6 kg)  ? ?Body mass index is 34.35 kg/m?Marland Kitchen ?Performance status (ECOG): 1 - Symptomatic but completely ambulatory ?Physical Exam ?Constitutional:   ?   Appearance: Normal appearance. She is not ill-appearing.  ?HENT:  ?   Mouth/Throat:  ?   Mouth: Mucous membranes are moist.  ?   Pharynx: Oropharynx is clear. No oropharyngeal exudate or posterior oropharyngeal erythema.  ?Cardiovascular:  ?   Rate and Rhythm: Normal rate and regular rhythm.  ?   Heart sounds: No murmur heard. ?  No friction rub. No gallop.  ?Pulmonary:  ?   Effort: Pulmonary effort is normal. No respiratory distress.  ?   Breath sounds: Normal breath sounds. No wheezing, rhonchi or rales.  ?Abdominal:  ?   General: Bowel sounds are normal. There is no distension.  ?   Palpations: Abdomen is soft. There is no mass.  ?   Tenderness: There is no abdominal tenderness.  ?Musculoskeletal:     ?   General: No swelling.  ?   Right lower leg: No edema.  ?   Left lower leg:  No edema.  ?Lymphadenopathy:  ?   Cervical: No cervical adenopathy.  ?   Upper Body:  ?   Right upper body: No supraclavicular or axillary adenopathy.  ?   Left upper body: No supraclavicular or axillary adenopathy.  ?   Lower Body: No right inguinal adenopathy. No left inguinal adenopathy.  ?Skin: ?   General: Skin is warm.  ?   Coloration: Skin is not jaundiced.  ?   Findings: No lesion or rash.  ?Neurological:  ?   General: No focal deficit present.  ?   Mental Status: She is alert and oriented to person, place, and time. Mental status is at baseline.  ?Psychiatric:     ?   Mood and Affect: Mood normal.     ?   Behavior: Behavior normal.     ?   Thought Content: Thought content normal.  ? ?LABS:  ? ? ?  Latest Ref Rng & Units 08/07/2021  ? 12:00 AM 05/20/2021  ? 10:52 AM 02/11/2021  ? 10:32 AM  ?CBC  ?WBC  4.8      6.0   5.8    ?Hemoglobin 12.0 - 16.0 12.8      13.5   12.6    ?Hematocrit 36 - 46 39      39.6   36.7    ?Platelets 150 - 400 K/uL 91  113   107    ?  ? This result is from an external source.  ? ? ? ?  Latest Ref Rng & Units 08/07/2021  ? 12:00 AM 05/20/2021  ? 10:52 AM 02/11/2021  ? 10:32 AM  ?CMP  ?Glucose 70 - 99 mg/dL  103   99    ?BUN 4 - 21 12      13   13     ?Creatinine 0.5 - 1.1 0.5      0.75   0.68    ?Sodium 137 - 147 140      144   140    ?Potassium 3.5 - 5.1 mEq/L 3.6      3.9   3.6    ?Chloride 99 - 108 109      108   103    ?CO2 13 - 22 25      23   20     ?Calcium 8.7 - 10.7 8.7      8.9   8.7    ?Total Protein 6.0 - 8.5 g/dL  6.7   6.7    ?Total Bilirubin 0.0 - 1.2 mg/dL  0.9   1.0    ?Alkaline Phos 25 - 125 59      71   79    ?AST 13 - 35 37      32   35    ?ALT 7 - 35 U/L 25      17   19     ?  ? This result is from an external source.  ? ? ?ASSESSMENT & PLAN:  ?Assessment/Plan:  A 61 y.o. 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  Her platelet count of 91 today is very similar to what it has been over numerous years.  As long as her  platelet count remains well above 20,000, she should not develop any significant bleeding/bruising issues.  As her thrombocytopenia is stable and she is doing well from a hematologic perspective, she will continue to be followed yearly.  The patient understands all the plans discussed today and is in agreement with them.   ? ? ?Aviel Davalos Macarthur Critchley, MD   ? ? ?  ?

## 2021-08-17 ENCOUNTER — Encounter: Payer: Self-pay | Admitting: Thoracic Surgery (Cardiothoracic Vascular Surgery)

## 2021-08-17 ENCOUNTER — Other Ambulatory Visit: Payer: Self-pay | Admitting: *Deleted

## 2021-08-17 ENCOUNTER — Ambulatory Visit (INDEPENDENT_AMBULATORY_CARE_PROVIDER_SITE_OTHER): Payer: PPO | Admitting: Thoracic Surgery (Cardiothoracic Vascular Surgery)

## 2021-08-17 ENCOUNTER — Other Ambulatory Visit: Payer: Self-pay | Admitting: Thoracic Surgery (Cardiothoracic Vascular Surgery)

## 2021-08-17 VITALS — BP 129/84 | HR 113 | Resp 20 | Ht 63.0 in | Wt 193.0 lb

## 2021-08-17 DIAGNOSIS — R911 Solitary pulmonary nodule: Secondary | ICD-10-CM

## 2021-08-17 NOTE — H&P (View-Only) (Signed)
PCP is [name], PA-C ?Referring Provider is [name], MD ? ?Chief Complaint  ?Patient presents with  ? Lung Lesion  ?  PET 3/22, CT chest 2/24  ? ? ?HPI: Natalie Perry is sent for consultation regarding a left lower lobe lung nodule. ? ?Natalie Perry is a 61 year old woman with a history of PE, arthritis, asthma, hepatitis C (treated), cirrhosis, bipolar disorder, and remote tobacco use (quit 20 years ago).  She has severe arthritis in both knees and uses a walker.  She had a pulmonary embolus in June 2022.  On the CT angiogram she was found to have a left lower lobe lung nodule.  A follow-up CT in 6 months showed the nodule had increased in size.  A PET/CT showed the nodule was hypermetabolic with an SUV of 4.3. ? ?Physical activity is limited by arthritis.  No shortness of breath.  No chest pain, pressure or tightness with activity.  She has lost some weight due to both diet and exercise (swimming). ? ?Natalie Perry: ?At the time of surgery this patient?s most appropriate activity status/level should be described as: ?[]     0    Normal activity, no symptoms ?[x]     1    Restricted in physical strenuous activity but ambulatory, able to do out light work ?[]     2    Ambulatory and capable of self care, unable to do work activities, up and about >50 % of waking hours                              ?[]     3    Only limited self care, in bed greater than 50% of waking hours ?[]     4    Completely disabled, no self care, confined to bed or chair ?[]     5    Moribund ? ?Past Medical History:  ?Diagnosis Date  ? Arthritis   ? Asthma   ? Bipolar affective disorder ([place])   ? Cirrhosis of liver due to hepatitis C   ? Thyroid disease   ? ? ?Past Surgical History:  ?Procedure Laterality Date  ? ABDOMINAL HYSTERECTOMY    ? ABDOMINAL SURGERY    ? APPENDECTOMY    ? CHOLECYSTECTOMY    ? ? ?Family History  ?Problem Relation Age of Onset  ? Asthma Mother   ? Bipolar disorder Mother   ? CAD Father   ? Hypertension Father    ? ? ?Social History ?Social History  ? ?Tobacco Use  ? Smoking status: Never  ? Smokeless tobacco: Never  ?Vaping Use  ? Vaping Use: Never used  ?Substance Use Topics  ? Alcohol use: No  ? Drug use: Never  ? ? ?Current Outpatient Medications  ?Medication Sig Dispense Refill  ? albuterol (PROVENTIL) (2.5 MG/3ML) 0.083% nebulizer solution INHALE 1 VIAL VIA NEBULIZER 3 TO 4 TIMES A DAY AS DIRECTED 300 mL 3  ? albuterol (VENTOLIN HFA) 108 (90 Base) MCG/ACT inhaler INHALE 1-2 PUFFS into THE lungs BY MOUTH EVERY 4 TO 6 HOURS AS NEEDED 8.5 g 5  ? amLODipine (NORVASC) 5 MG tablet TAKE 1 TABLET BY MOUTH EVERY DAY 30 tablet 2  ? betamethasone dipropionate (DIPROLENE) 0.05 % ointment Apply topically daily as needed.    ? ELIQUIS 5 MG TABS tablet TAKE 1 TABLET(5 MG) BY MOUTH TWICE DAILY 60 tablet 0  ? levothyroxine (SYNTHROID) 100 MCG tablet TAKE ONE TABLET BY MOUTH EVERY  MORNING 90 tablet 0  ? LORazepam (ATIVAN) 1 MG tablet TAKE 1 TABLET(1 MG) BY MOUTH THREE TIMES DAILY AS NEEDED FOR ANXIETY 90 tablet 0  ? losartan-hydrochlorothiazide (HYZAAR) 100-25 MG tablet Take 1 tablet by mouth daily. 90 tablet 0  ? oxyCODONE (OXY IR/ROXICODONE) 5 MG immediate release tablet Take 1 tablet (5 mg total) by mouth 2 (two) times daily as needed. 60 tablet 0  ? potassium chloride SA (KLOR-CON M) 20 MEQ tablet Take 2 tablets (40 mEq total) by mouth 2 (two) times daily. 360 tablet 0  ? promethazine (PHENERGAN) 25 MG tablet Take 1 tablet (25 mg total) by mouth every 8 (eight) hours as needed for nausea or vomiting. 30 tablet 2  ? SYMBICORT 160-4.5 MCG/ACT inhaler Inhale 2 puffs into the lungs 2 (two) times daily. 1 each 12  ? Vitamin D, Ergocalciferol, (DRISDOL) 1.25 MG (50000 UNIT) CAPS capsule TAKE 1 CAPSULE BY MOUTH EVERY 7 DAYS 12 capsule 1  ? lactulose (CHRONULAC) 10 GM/15ML solution  (Patient not taking: Reported on 08/17/2021)    ? montelukast (SINGULAIR) 10 MG tablet Take 10 mg by mouth daily. (Patient not taking: Reported on 08/17/2021)     ? ?No current facility-administered medications for this visit.  ? ? ?Allergies  ?Allergen Reactions  ? Penicillins Hives, Swelling, Rash and Other (See Comments)  ? Nicotine   ? ? ?Review of Systems  ?Constitutional:  Negative for activity change and unexpected weight change.  ?HENT:  Negative for trouble swallowing and voice change.   ?Eyes:  Negative for visual disturbance.  ?Respiratory:  Negative for shortness of breath and wheezing.   ?Cardiovascular:  Positive for leg swelling. Negative for chest pain.  ?Gastrointestinal:  Positive for diarrhea.  ?Genitourinary:  Negative for difficulty urinating and dysuria.  ?Musculoskeletal:  Positive for arthralgias, gait problem (Uses a walker due to knee pain) and joint swelling.  ?Skin:  Positive for rash.  ?Hematological:  Bruises/bleeds easily (On Eliquis).  ?Psychiatric/Behavioral:  The patient is nervous/anxious.   ?All other systems reviewed and are negative. ? ?BP 129/84 (BP Location: Right Arm, Patient Position: Sitting)   Pulse (!) 113   Resp 20   Ht 5\' 3"  (1.6 m)   Wt 193 lb (87.5 kg)   SpO2 96% Comment: RA  BMI 34.19 kg/m?  ?Physical Exam ?Vitals reviewed.  ?Constitutional:   ?   General: She is not in acute distress. ?   Appearance: She is obese.  ?HENT:  ?   Head: Normocephalic and atraumatic.  ?Eyes:  ?   General: No scleral icterus. ?   Extraocular Movements: Extraocular movements intact.  ?Neck:  ?   Vascular: No carotid bruit.  ?Cardiovascular:  ?   Rate and Rhythm: Normal rate and regular rhythm.  ?   Pulses: Normal pulses.  ?   Heart sounds: Normal heart sounds.  ?Pulmonary:  ?   Effort: Pulmonary effort is normal. No respiratory distress.  ?   Breath sounds: Normal breath sounds. No wheezing or rales.  ?Abdominal:  ?   General: There is no distension.  ?   Palpations: Abdomen is soft.  ?Musculoskeletal:  ?   Cervical back: Neck supple.  ?Lymphadenopathy:  ?   Cervical: No cervical adenopathy.  ?Skin: ?   General: Skin is warm and dry.   ?Neurological:  ?   General: No focal deficit present.  ?   Mental Status: She is alert and oriented to person, place, and time.  ?   Cranial Nerves:  No cranial nerve deficit.  ?   Motor: No weakness.  ?   Gait: Gait abnormal.  ? ? ? ?Diagnostic Tests: ?I personally reviewed the CT and PET/CT images.  They are available through PACS from [facility].  There is a 1.7 x 1.1 x 1.7 cm nodule in the superior segment of the left lower lobe abutting the fissure.  Slight increase in size from June 2022.  No mediastinal or hilar adenopathy.  PET/CT shows the nodule is hypermetabolic with an SUV of 4.3.  No evidence of regional or distant metastatic disease. ? ?Impression: ?Natalie Perry is a 61 year old woman with a history of PE, arthritis, asthma, hepatitis C (treated), cirrhosis, bipolar disorder, and remote tobacco use.  She has a history of light tobacco use but quit 20 years ago.  Last summer she had a pulmonary embolus.  CT angiogram showed a nodule in the superior segment of the left lower lobe.  On a follow-up scan the nodule had increased in size and on PET/CT it is markedly hypermetabolic.  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  Although infectious and inflammatory nodules are in the differential, this is a primary bronchogenic carcinoma until proven otherwise. ? ?I recommended that we proceed with a robotic left VATS for left lower lobe superior segmentectomy.  This procedure would provide definitive diagnosis and treatment at the same setting.  She understands the alternative treatment would be radiation.  We discussed the relative pros and cons of surgery versus radiation. ? ?I informed her and her son of the general nature of the procedure including the need for general anesthesia, the incisions to be used, the use of a drainage tube postoperatively, the expected hospital stay, and the overall recovery.  I informed them of the indications, risks, benefits, and alternatives.   They understand the risks include, but are not limited to death, MI, DVT, PE, bleeding, possible need for transfusion, infection, prolonged air leak, cardiac arrhythmias, as well as possibility of other unforeseeable com

## 2021-08-17 NOTE — Progress Notes (Signed)
PCP is [name], PA-C ?Referring Provider is [name], MD ? ?Chief Complaint  ?Patient presents with  ? Lung Lesion  ?  PET 3/22, CT chest 2/24  ? ? ?HPI: Natalie Perry is sent for consultation regarding a left lower lobe lung nodule. ? ?Natalie Perry is a 61 year old woman with a history of PE, arthritis, asthma, hepatitis C (treated), cirrhosis, bipolar disorder, and remote tobacco use (quit 20 years ago).  She has severe arthritis in both knees and uses a walker.  She had a pulmonary embolus in June 2022.  On the CT angiogram she was found to have a left lower lobe lung nodule.  A follow-up CT in 6 months showed the nodule had increased in size.  A PET/CT showed the nodule was hypermetabolic with an SUV of 4.3. ? ?Physical activity is limited by arthritis.  No shortness of breath.  No chest pain, pressure or tightness with activity.  She has lost some weight due to both diet and exercise (swimming). ? ?Zubrod Score: ?At the time of surgery this patient?s most appropriate activity status/level should be described as: ?[]     0    Normal activity, no symptoms ?[x]     1    Restricted in physical strenuous activity but ambulatory, able to do out light work ?[]     2    Ambulatory and capable of self care, unable to do work activities, up and about >50 % of waking hours                              ?[]     3    Only limited self care, in bed greater than 50% of waking hours ?[]     4    Completely disabled, no self care, confined to bed or chair ?[]     5    Moribund ? ?Past Medical History:  ?Diagnosis Date  ? Arthritis   ? Asthma   ? Bipolar affective disorder ([place])   ? Cirrhosis of liver due to hepatitis C   ? Thyroid disease   ? ? ?Past Surgical History:  ?Procedure Laterality Date  ? ABDOMINAL HYSTERECTOMY    ? ABDOMINAL SURGERY    ? APPENDECTOMY    ? CHOLECYSTECTOMY    ? ? ?Family History  ?Problem Relation Age of Onset  ? Asthma Mother   ? Bipolar disorder Mother   ? CAD Father   ? Hypertension Father    ? ? ?Social History ?Social History  ? ?Tobacco Use  ? Smoking status: Never  ? Smokeless tobacco: Never  ?Vaping Use  ? Vaping Use: Never used  ?Substance Use Topics  ? Alcohol use: No  ? Drug use: Never  ? ? ?Current Outpatient Medications  ?Medication Sig Dispense Refill  ? albuterol (PROVENTIL) (2.5 MG/3ML) 0.083% nebulizer solution INHALE 1 VIAL VIA NEBULIZER 3 TO 4 TIMES A DAY AS DIRECTED 300 mL 3  ? albuterol (VENTOLIN HFA) 108 (90 Base) MCG/ACT inhaler INHALE 1-2 PUFFS into THE lungs BY MOUTH EVERY 4 TO 6 HOURS AS NEEDED 8.5 g 5  ? amLODipine (NORVASC) 5 MG tablet TAKE 1 TABLET BY MOUTH EVERY DAY 30 tablet 2  ? betamethasone dipropionate (DIPROLENE) 0.05 % ointment Apply topically daily as needed.    ? ELIQUIS 5 MG TABS tablet TAKE 1 TABLET(5 MG) BY MOUTH TWICE DAILY 60 tablet 0  ? levothyroxine (SYNTHROID) 100 MCG tablet TAKE ONE TABLET BY MOUTH EVERY  MORNING 90 tablet 0  ? LORazepam (ATIVAN) 1 MG tablet TAKE 1 TABLET(1 MG) BY MOUTH THREE TIMES DAILY AS NEEDED FOR ANXIETY 90 tablet 0  ? losartan-hydrochlorothiazide (HYZAAR) 100-25 MG tablet Take 1 tablet by mouth daily. 90 tablet 0  ? oxyCODONE (OXY IR/ROXICODONE) 5 MG immediate release tablet Take 1 tablet (5 mg total) by mouth 2 (two) times daily as needed. 60 tablet 0  ? potassium chloride SA (KLOR-CON M) 20 MEQ tablet Take 2 tablets (40 mEq total) by mouth 2 (two) times daily. 360 tablet 0  ? promethazine (PHENERGAN) 25 MG tablet Take 1 tablet (25 mg total) by mouth every 8 (eight) hours as needed for nausea or vomiting. 30 tablet 2  ? SYMBICORT 160-4.5 MCG/ACT inhaler Inhale 2 puffs into the lungs 2 (two) times daily. 1 each 12  ? Vitamin D, Ergocalciferol, (DRISDOL) 1.25 MG (50000 UNIT) CAPS capsule TAKE 1 CAPSULE BY MOUTH EVERY 7 DAYS 12 capsule 1  ? lactulose (CHRONULAC) 10 GM/15ML solution  (Patient not taking: Reported on 08/17/2021)    ? montelukast (SINGULAIR) 10 MG tablet Take 10 mg by mouth daily. (Patient not taking: Reported on 08/17/2021)     ? ?No current facility-administered medications for this visit.  ? ? ?Allergies  ?Allergen Reactions  ? Penicillins Hives, Swelling, Rash and Other (See Comments)  ? Nicotine   ? ? ?Review of Systems  ?Constitutional:  Negative for activity change and unexpected weight change.  ?HENT:  Negative for trouble swallowing and voice change.   ?Eyes:  Negative for visual disturbance.  ?Respiratory:  Negative for shortness of breath and wheezing.   ?Cardiovascular:  Positive for leg swelling. Negative for chest pain.  ?Gastrointestinal:  Positive for diarrhea.  ?Genitourinary:  Negative for difficulty urinating and dysuria.  ?Musculoskeletal:  Positive for arthralgias, gait problem (Uses a walker due to knee pain) and joint swelling.  ?Skin:  Positive for rash.  ?Hematological:  Bruises/bleeds easily (On Eliquis).  ?Psychiatric/Behavioral:  The patient is nervous/anxious.   ?All other systems reviewed and are negative. ? ?BP 129/84 (BP Location: Right Arm, Patient Position: Sitting)   Pulse (!) 113   Resp 20   Ht 5\' 3"  (1.6 m)   Wt 193 lb (87.5 kg)   SpO2 96% Comment: RA  BMI 34.19 kg/m?  ?Physical Exam ?Vitals reviewed.  ?Constitutional:   ?   General: She is not in acute distress. ?   Appearance: She is obese.  ?HENT:  ?   Head: Normocephalic and atraumatic.  ?Eyes:  ?   General: No scleral icterus. ?   Extraocular Movements: Extraocular movements intact.  ?Neck:  ?   Vascular: No carotid bruit.  ?Cardiovascular:  ?   Rate and Rhythm: Normal rate and regular rhythm.  ?   Pulses: Normal pulses.  ?   Heart sounds: Normal heart sounds.  ?Pulmonary:  ?   Effort: Pulmonary effort is normal. No respiratory distress.  ?   Breath sounds: Normal breath sounds. No wheezing or rales.  ?Abdominal:  ?   General: There is no distension.  ?   Palpations: Abdomen is soft.  ?Musculoskeletal:  ?   Cervical back: Neck supple.  ?Lymphadenopathy:  ?   Cervical: No cervical adenopathy.  ?Skin: ?   General: Skin is warm and dry.   ?Neurological:  ?   General: No focal deficit present.  ?   Mental Status: She is alert and oriented to person, place, and time.  ?   Cranial Nerves:  No cranial nerve deficit.  ?   Motor: No weakness.  ?   Gait: Gait abnormal.  ? ? ? ?Diagnostic Tests: ?I personally reviewed the CT and PET/CT images.  They are available through PACS from [facility].  There is a 1.7 x 1.1 x 1.7 cm nodule in the superior segment of the left lower lobe abutting the fissure.  Slight increase in size from June 2022.  No mediastinal or hilar adenopathy.  PET/CT shows the nodule is hypermetabolic with an SUV of 4.3.  No evidence of regional or distant metastatic disease. ? ?Impression: ?Natalie Perry is a 61 year old woman with a history of PE, arthritis, asthma, hepatitis C (treated), cirrhosis, bipolar disorder, and remote tobacco use.  She has a history of light tobacco use but quit 20 years ago.  Last summer she had a pulmonary embolus.  CT angiogram showed a nodule in the superior segment of the left lower lobe.  On a follow-up scan the nodule had increased in size and on PET/CT it is markedly hypermetabolic.  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  Although infectious and inflammatory nodules are in the differential, this is a primary bronchogenic carcinoma until proven otherwise. ? ?I recommended that we proceed with a robotic left VATS for left lower lobe superior segmentectomy.  This procedure would provide definitive diagnosis and treatment at the same setting.  She understands the alternative treatment would be radiation.  We discussed the relative pros and cons of surgery versus radiation. ? ?I informed her and her son of the general nature of the procedure including the need for general anesthesia, the incisions to be used, the use of a drainage tube postoperatively, the expected hospital stay, and the overall recovery.  I informed them of the indications, risks, benefits, and alternatives.   They understand the risks include, but are not limited to death, MI, DVT, PE, bleeding, possible need for transfusion, infection, prolonged air leak, cardiac arrhythmias, as well as possibility of other unforeseeable com

## 2021-08-18 ENCOUNTER — Encounter: Payer: Self-pay | Admitting: *Deleted

## 2021-08-25 ENCOUNTER — Other Ambulatory Visit: Payer: Self-pay | Admitting: Physician Assistant

## 2021-08-25 ENCOUNTER — Ambulatory Visit (HOSPITAL_COMMUNITY)
Admission: RE | Admit: 2021-08-25 | Discharge: 2021-08-25 | Disposition: A | Discharge status: Home / Self Care | Payer: PPO | Source: Ambulatory Visit | Attending: Thoracic Surgery (Cardiothoracic Vascular Surgery) | Admitting: Thoracic Surgery (Cardiothoracic Vascular Surgery)

## 2021-08-25 DIAGNOSIS — R0609 Other forms of dyspnea: Secondary | ICD-10-CM | POA: Diagnosis not present

## 2021-08-25 DIAGNOSIS — Z87891 Personal history of nicotine dependence: Secondary | ICD-10-CM | POA: Insufficient documentation

## 2021-08-25 DIAGNOSIS — R911 Solitary pulmonary nodule: Secondary | ICD-10-CM | POA: Diagnosis not present

## 2021-08-25 LAB — PULMONARY FUNCTION TEST
DL/VA % pred: 89 %
DL/VA: 3.8 ml/min/mmHg/L
DLCO cor % pred: 60 %
DLCO cor: 11.8 ml/min/mmHg
DLCO unc % pred: 59 %
DLCO unc: 11.58 ml/min/mmHg
FEF 25-75 Post: 1.69 L/sec
FEF 25-75 Pre: 1.89 L/sec
FEF2575-%Change-Post: -10 %
FEF2575-%Pred-Post: 75 %
FEF2575-%Pred-Pre: 83 %
FEV1-%Change-Post: -1 %
FEV1-%Pred-Post: 63 %
FEV1-%Pred-Pre: 64 %
FEV1-Post: 1.55 L
FEV1-Pre: 1.56 L
FEV1FVC-%Change-Post: 0 %
FEV1FVC-%Pred-Pre: 106 %
FEV6-%Change-Post: -2 %
FEV6-%Pred-Post: 60 %
FEV6-%Pred-Pre: 61 %
FEV6-Post: 1.85 L
FEV6-Pre: 1.88 L
FEV6FVC-%Pred-Post: 103 %
FEV6FVC-%Pred-Pre: 103 %
FVC-%Change-Post: -1 %
FVC-%Pred-Post: 58 %
FVC-%Pred-Pre: 59 %
FVC-Post: 1.85 L
FVC-Pre: 1.88 L
Post FEV1/FVC ratio: 83 %
Post FEV6/FVC ratio: 100 %
Pre FEV1/FVC ratio: 83 %
Pre FEV6/FVC Ratio: 100 %
RV % pred: 89 %
RV: 1.75 L
TLC % pred: 78 %
TLC: 3.83 L

## 2021-08-25 MED ORDER — ALBUTEROL SULFATE (2.5 MG/3ML) 0.083% IN NEBU
2.5000 mg | INHALATION_SOLUTION | Freq: Once | RESPIRATORY_TRACT | Status: AC
  Administered 2021-08-25: 2.5 mg via RESPIRATORY_TRACT

## 2021-08-26 ENCOUNTER — Ambulatory Visit (INDEPENDENT_AMBULATORY_CARE_PROVIDER_SITE_OTHER): Payer: PPO | Admitting: Physician Assistant

## 2021-08-26 ENCOUNTER — Encounter: Payer: Self-pay | Admitting: Physician Assistant

## 2021-08-26 VITALS — BP 126/74 | HR 92 | Temp 98.0°F | Resp 20 | Ht 63.0 in | Wt 194.4 lb

## 2021-08-26 DIAGNOSIS — I10 Essential (primary) hypertension: Secondary | ICD-10-CM | POA: Diagnosis not present

## 2021-08-26 DIAGNOSIS — R911 Solitary pulmonary nodule: Secondary | ICD-10-CM | POA: Diagnosis not present

## 2021-08-26 DIAGNOSIS — E559 Vitamin D deficiency, unspecified: Secondary | ICD-10-CM | POA: Diagnosis not present

## 2021-08-26 DIAGNOSIS — G8929 Other chronic pain: Secondary | ICD-10-CM | POA: Diagnosis not present

## 2021-08-26 DIAGNOSIS — M25561 Pain in right knee: Secondary | ICD-10-CM

## 2021-08-26 DIAGNOSIS — E039 Hypothyroidism, unspecified: Secondary | ICD-10-CM

## 2021-08-26 DIAGNOSIS — R0789 Other chest pain: Secondary | ICD-10-CM

## 2021-08-26 DIAGNOSIS — J45998 Other asthma: Secondary | ICD-10-CM | POA: Diagnosis not present

## 2021-08-26 DIAGNOSIS — M25562 Pain in left knee: Secondary | ICD-10-CM

## 2021-08-26 DIAGNOSIS — F419 Anxiety disorder, unspecified: Secondary | ICD-10-CM

## 2021-08-26 MED ORDER — OXYCODONE HCL 5 MG PO TABS: 5.0000 mg | ORAL_TABLET | Freq: Two times a day (BID) | ORAL | 0 refills | Status: DC | PRN

## 2021-08-26 NOTE — Progress Notes (Signed)
? ?Subjective:  ?Patient ID: Natalie Perry, female    DOB: 1961-03-07  Age: 61 y.o. MRN: [id number] ? ?Chief Complaint  ?Patient presents with  ? Hypertension  ? ? ?HPI ? Pt presents for follow up of hypertension. The patient is tolerating the medication well without side effects. Compliance with treatment has been good; including taking medication as directed , maintains a healthy diet and regular exercise regimen , and following up as directed. She is currently on hyzaar 100/25 qdand norvasc 5mg  qd ? ?Pt with history of asthma/COPD - symptoms are stable on nebulizer treatments and symbicort.  She was recently diagnosed with a lung nodule and following with Dr Roxan Hockey - is scheduled for surgery on Monday for resection ?States her breathing is doing well ? ?Pt states she has had some mild intermittent chest pains associated with what she feels is anxiety worrying about the procedure she will be having - says she feels tight when she gets anxious and not at any other times - no dyspnea ?She is using ativan as needed for her anxiety ? ?Pt with history of chronic bilateral knee pain - is not a candidate for surgery - uses oxycodone as needed for pain management and requests refill of medication today ? ?Pt with history of Vit d def - on supplements - due for labwork ? ?Pt with history of hypothyroidism - currently on synthroid 148mcg ? ?Pt with history of thrombocytopenia and also history of embolus - on current therapy with Eliquis 5mg  ? ?Current Outpatient Medications on File Prior to Visit  ?Medication Sig Dispense Refill  ? acetaminophen (TYLENOL) 500 MG tablet Take 500 mg by mouth every 6 (six) hours as needed (pain.).    ? albuterol (PROVENTIL) (2.5 MG/3ML) 0.083% nebulizer solution INHALE 1 VIAL VIA NEBULIZER 3 TO 4 TIMES A DAY AS DIRECTED 300 mL 3  ? albuterol (VENTOLIN HFA) 108 (90 Base) MCG/ACT inhaler INHALE 1-2 PUFFS into THE lungs EVERY 4 TO 6 HOURS AS NEEDED 8.5 g 5  ? amLODipine (NORVASC) 5 MG  tablet TAKE 1 TABLET BY MOUTH EVERY DAY (Patient taking differently: Take 5 mg by mouth every evening.) 30 tablet 2  ? betamethasone dipropionate (DIPROLENE) 0.05 % ointment Apply 1 application. topically daily as needed (skin irritation.).    ? ELIQUIS 5 MG TABS tablet TAKE 1 TABLET(5 MG) BY MOUTH TWICE DAILY 60 tablet 0  ? levothyroxine (SYNTHROID) 100 MCG tablet TAKE ONE TABLET BY MOUTH EVERY MORNING 90 tablet 0  ? LORazepam (ATIVAN) 1 MG tablet TAKE 1 TABLET(1 MG) BY MOUTH THREE TIMES DAILY AS NEEDED FOR ANXIETY 90 tablet 0  ? losartan-hydrochlorothiazide (HYZAAR) 100-25 MG tablet Take 1 tablet by mouth daily. 90 tablet 0  ? Polyethyl Glycol-Propyl Glycol (LUBRICANT EYE DROPS) 0.4-0.3 % SOLN Place 1-2 drops into both eyes 3 (three) times daily as needed (dry/irritated eyes.).    ? potassium chloride SA (KLOR-CON M) 20 MEQ tablet Take 2 tablets (40 mEq total) by mouth 2 (two) times daily. 360 tablet 0  ? promethazine (PHENERGAN) 25 MG tablet Take 1 tablet (25 mg total) by mouth every 8 (eight) hours as needed for nausea or vomiting. 30 tablet 2  ? SYMBICORT 160-4.5 MCG/ACT inhaler Inhale 2 puffs into the lungs 2 (two) times daily. (Patient taking differently: Inhale 1 puff into the lungs 2 (two) times daily.) 1 each 12  ? Vitamin D, Ergocalciferol, (DRISDOL) 1.25 MG (50000 UNIT) CAPS capsule TAKE 1 CAPSULE BY MOUTH EVERY 7 DAYS (Patient taking  differently: Take 50,000 Units by mouth every Wednesday.) 12 capsule 1  ? ?No current facility-administered medications on file prior to visit.  ? ?Past Medical History:  ?Diagnosis Date  ? Arthritis   ? Asthma   ? Bipolar affective disorder ([place])   ? Cirrhosis of liver due to hepatitis C   ? Thyroid disease   ? ?Past Surgical History:  ?Procedure Laterality Date  ? ABDOMINAL HYSTERECTOMY    ? ABDOMINAL SURGERY    ? APPENDECTOMY    ? CHOLECYSTECTOMY    ?  ?Family History  ?Problem Relation Age of Onset  ? Asthma Mother   ? Bipolar disorder Mother   ? CAD Father   ?  Hypertension Father   ? ?Social History  ? ?Socioeconomic History  ? Marital status: Divorced  ?  Spouse name: Not on file  ? Number of children: 2  ? Years of education: Not on file  ? Highest education level: Not on file  ?Occupational History  ? Occupation: disabled  ?Tobacco Use  ? Smoking status: Never  ? Smokeless tobacco: Never  ?Vaping Use  ? Vaping Use: Never used  ?Substance and Sexual Activity  ? Alcohol use: No  ? Drug use: Never  ? Sexual activity: Not on file  ?Other Topics Concern  ? Not on file  ?Social History Narrative  ? Not on file  ? ?Social Determinants of Health  ? ?Financial Resource Strain: Not on file  ?Food Insecurity: Not on file  ?Transportation Needs: Not on file  ?Physical Activity: Insufficiently Active  ? Days of Exercise per Week: 2 days  ? Minutes of Exercise per Session: 30 min  ?Stress: Not on file  ?Social Connections: Not on file  ? ? ?Review of Systems ?CONSTITUTIONAL: Negative for chills, fatigue, fever, unintentional weight gain and unintentional weight loss.  ?E/N/T: Negative for ear pain, nasal congestion and sore throat.  ?CARDIOVASCULAR: Negative for chest pain, dizziness, palpitations and pedal edema.  ?RESPIRATORY: Negative for recent cough and dyspnea.  ?GASTROINTESTINAL: Negative for abdominal pain, acid reflux symptoms, constipation, diarrhea, nausea and vomiting.  ?MSK: Negative for arthralgias and myalgias.  ?INTEGUMENTARY: Negative for rash.  ?NEUROLOGICAL: Negative for dizziness and headaches.  ?PSYCHIATRIC: see HPI ?   ? ? ?Objective:  ?BP 126/74   Pulse 92   Temp 98 ?F (36.7 ?C)   Resp 20   Ht 5\' 3"  (1.6 m)   Wt 194 lb 6.4 oz (88.2 kg)   SpO2 97%   BMI 34.44 kg/m?  ? ? ?  08/26/2021  ? 10:30 AM 08/17/2021  ?  3:47 PM 08/07/2021  ?  3:14 PM  ?BP/Weight  ?Systolic BP 220 254 270  ?Diastolic BP 74 84 72  ?Wt. (Lbs) 194.4 193 193.9  ?BMI 34.44 kg/m2 34.19 kg/m2 34.35 kg/m2  ? ? ?Physical Exam ?PHYSICAL EXAM:  ? ?VS: BP 126/74   Pulse 92   Temp 98 ?F (36.7  ?C)   Resp 20   Ht 5\' 3"  (1.6 m)   Wt 194 lb 6.4 oz (88.2 kg)   SpO2 97%   BMI 34.44 kg/m?  ? ?GEN: Well nourished, well developed, in no acute distress  ?Cardiac: RRR; no murmurs, rubs, or gallops,no edema -  ?Respiratory:  normal respiratory rate and pattern with no distress - normal breath sounds with no rales, rhonchi, wheezes or rubs ?MS: no deformity or atrophy  ?Skin: warm and dry, no rash  ?Neuro:  Alert and Oriented x 3, Strength and sensation  are intact - CN II-Xii grossly intact ?Psych: euthymic mood, appropriate affect and demeanor ? ?EKG - normal ?Diabetic Foot Exam - Simple   ?No data filed ?  ?  ? ?Lab Results  ?Component Value Date  ? WBC 4.8 08/07/2021  ? HGB 12.8 08/07/2021  ? HCT 39 08/07/2021  ? PLT 91 (A) 08/07/2021  ? GLUCOSE 103 (H) 05/20/2021  ? CHOL 111 05/20/2021  ? TRIG 42 05/20/2021  ? HDL 42 05/20/2021  ? [place] 58 05/20/2021  ? ALT 25 08/07/2021  ? AST 37 (A) 08/07/2021  ? NA 140 08/07/2021  ? K 3.6 08/07/2021  ? CL 109 (A) 08/07/2021  ? CREATININE 0.5 08/07/2021  ? BUN 12 08/07/2021  ? CO2 25 (A) 08/07/2021  ? TSH 3.550 05/20/2021  ? INR 1.1 11/16/2006  ? ? ? ? ?Assessment & Plan:  ? ?Problem List Items Addressed This Visit   ? ?  ? Cardiovascular and Mediastinum  ? Benign hypertension - Primary  ? Relevant Orders  ? CBC with Differential/Platelet  ? Comprehensive metabolic panel  ? Lipid panel ?Continue current meds  ?  ? Respiratory  ? Lung nodule ?Follow up with Dr Roxan Hockey as directed  ?  ? Endocrine  ? Hypothyroid  ? Relevant Orders  ? TSH ?Continue meds  ?  ? Other  ? Vitamin D deficiency  ? Relevant Orders  ? VITAMIN D 25 Hydroxy (Vit-D Deficiency, Fractures)  ? Chronic pain of both knees ?Continue meds as directed  ? ?Other Visit Diagnoses   ? ? Other chest pain      ? Relevant Orders  ? EKG 12-Lead  ? Anxiety     ?Continue meds  ? ?  ?. ? ?Meds ordered this encounter  ?Medications  ? oxyCODONE (OXY IR/ROXICODONE) 5 MG immediate release tablet  ?  Sig: Take 1 tablet (5  mg total) by mouth 2 (two) times daily as needed.  ?  Dispense:  60 tablet  ?  Refill:  0  ?  Order Specific Question:   Supervising Provider  ?  AnswerRochel Brome [[id number]]  ? ? ?Orders Placed This Encounter  ?Procedures

## 2021-08-27 LAB — COMPREHENSIVE METABOLIC PANEL
ALT: 20 IU/L (ref 0–32)
AST: 30 IU/L (ref 0–40)
Albumin/Globulin Ratio: 1.6 (ref 1.2–2.2)
Albumin: 4.1 g/dL (ref 3.8–4.8)
Alkaline Phosphatase: 73 IU/L (ref 44–121)
BUN/Creatinine Ratio: 22 (ref 12–28)
BUN: 13 mg/dL (ref 8–27)
Bilirubin Total: 0.9 mg/dL (ref 0.0–1.2)
CO2: 22 mmol/L (ref 20–29)
Calcium: 8.9 mg/dL (ref 8.7–10.3)
Chloride: 108 mmol/L — ABNORMAL HIGH (ref 96–106)
Creatinine, Ser: 0.6 mg/dL (ref 0.57–1.00)
Globulin, Total: 2.5 g/dL (ref 1.5–4.5)
Glucose: 88 mg/dL (ref 70–99)
Potassium: 3.9 mmol/L (ref 3.5–5.2)
Sodium: 143 mmol/L (ref 134–144)
Total Protein: 6.6 g/dL (ref 6.0–8.5)
eGFR: 102 mL/min/{1.73_m2} (ref >59)

## 2021-08-27 LAB — CBC WITH DIFFERENTIAL/PLATELET
Basophils Absolute: 0 10*3/uL (ref 0.0–0.2)
Basos: 0 %
EOS (ABSOLUTE): 0.1 10*3/uL (ref 0.0–0.4)
Eos: 3 %
Hematocrit: 37.4 % (ref 34.0–46.6)
Hemoglobin: 12.8 g/dL (ref 11.1–15.9)
Immature Grans (Abs): 0 10*3/uL (ref 0.0–0.1)
Immature Granulocytes: 0 %
Lymphocytes Absolute: 0.9 10*3/uL (ref 0.7–3.1)
Lymphs: 16 %
MCH: 30.2 pg (ref 26.6–33.0)
MCHC: 34.2 g/dL (ref 31.5–35.7)
MCV: 88 fL (ref 79–97)
Monocytes Absolute: 0.5 10*3/uL (ref 0.1–0.9)
Monocytes: 9 %
Neutrophils Absolute: 3.8 10*3/uL (ref 1.4–7.0)
Neutrophils: 72 %
Platelets: 100 10*3/uL — CL (ref 150–450)
RBC: 4.24 x10E6/uL (ref 3.77–5.28)
RDW: 13.1 % (ref 11.7–15.4)
WBC: 5.3 10*3/uL (ref 3.4–10.8)

## 2021-08-27 LAB — LIPID PANEL
Chol/HDL Ratio: 2.4 ratio (ref 0.0–4.4)
Cholesterol, Total: 104 mg/dL (ref 100–199)
HDL: 43 mg/dL (ref >39)
LDL Chol Calc (NIH): 51 mg/dL (ref 0–99)
Triglycerides: 38 mg/dL (ref 0–149)
VLDL Cholesterol Cal: 10 mg/dL (ref 5–40)

## 2021-08-27 LAB — TSH: TSH: 2.09 u[IU]/mL (ref 0.450–4.500)

## 2021-08-27 LAB — VITAMIN D 25 HYDROXY (VIT D DEFICIENCY, FRACTURES): Vit D, 25-Hydroxy: 47.8 ng/mL (ref 30.0–100.0)

## 2021-08-27 LAB — CARDIOVASCULAR RISK ASSESSMENT

## 2021-08-27 NOTE — Progress Notes (Signed)
Surgical Instructions ? ? ? Your procedure is scheduled on Monday, April 24th, 2023. ? ? Report to [facility] Main Entrance "A" at 05:30 A.M., then check in with the Admitting office. ? Call this number if you have problems the morning of surgery: ? [phone] ? ? If you have any questions prior to your surgery date call [phone]: Open Monday-Friday 8am-4pm ? ? ? Remember: ? Do not eat or drink after midnight the night before your surgery ?  ? Take these medicines the morning of surgery with A SIP OF WATER:  ? ?levothyroxine (SYNTHROID)  ?SYMBICORT  ? ?If needed: ? ?acetaminophen (TYLENOL)  ?albuterol (PROVENTIL)  ?albuterol (VENTOLIN HFA)  ?LORazepam (ATIVAN) ?oxyCODONE (OXY IR/ROXICODONE) ?Polyethyl Glycol-Propyl Glycol (LUBRICANT EYE DROPS) ?promethazine (PHENERGAN) ? ?Please bring all inhalers with you the day of surgery.   ? ?Follow your surgeon's instructions on when to stop Eliquis.  If no instructions were given by your surgeon then you will need to call the office to get those instructions.    ? ?As of today, STOP taking any Aspirin (unless otherwise instructed by your surgeon) Aleve, Naproxen, Ibuprofen, Motrin, Advil, Goody's, BC's, all herbal medications, fish oil, and all vitamins. ? ? ? The day of surgery: ?         ?Do not wear jewelry or makeup ?Do not wear lotions, powders, perfumes, or deodorant. ?Do not shave 48 hours prior to surgery.   ?Do not bring valuables to the hospital. ?Do not wear nail polish, gel polish, artificial nails, or any other type of covering on natural nails (fingers and toes) ?If you have artificial nails or gel coating that need to be removed by a nail salon, please have this removed prior to surgery. Artificial nails or gel coating may interfere with anesthesia's ability to adequately monitor your vital signs. ? ? ?[facility] is not responsible for any belongings or valuables. .  ? ?Do NOT Smoke (Tobacco/Vaping)  24 hours prior to your procedure ? ?If you use a CPAP  at night, you may bring your mask for your overnight stay. ?  ?Contacts, glasses, hearing aids, dentures or partials may not be worn into surgery, please bring cases for these belongings ?  ?For patients admitted to the hospital, discharge time will be determined by your treatment team. ?  ?Patients discharged the day of surgery will not be allowed to drive home, and someone needs to stay with them for 24 hours. ? ? ?SURGICAL WAITING ROOM VISITATION ?Patients having surgery or a procedure in a hospital may have two support people. ?Children under the age of 42 must have an adult with them who is not the patient. ?They may stay in the waiting area during the procedure and may switch out with other visitors. If the patient needs to stay at the hospital during part of their recovery, the visitor guidelines for inpatient rooms apply. ? ?Please refer to the [facility] website for the visitor guidelines for Inpatients (after your surgery is over and you are in a regular room).  ? ? ?Special instructions:   ? ?Oral Hygiene is also important to reduce your risk of infection.  Remember - BRUSH YOUR TEETH THE MORNING OF SURGERY WITH YOUR REGULAR TOOTHPASTE ? ? ?[facility]- Preparing For Surgery ? ?Before surgery, you can play an important role. Because skin is not sterile, your skin needs to be as free of germs as possible. You can reduce the number of germs on your skin by washing with CHG (  chlorahexidine gluconate) Soap before surgery.  CHG is an antiseptic cleaner which kills germs and bonds with the skin to continue killing germs even after washing.   ? ? ?Please do not use if you have an allergy to CHG or antibacterial soaps. If your skin becomes reddened/irritated stop using the CHG.  ?Do not shave (including legs and underarms) for at least 48 hours prior to first CHG shower. It is OK to shave your face. ? ?Please follow these instructions carefully. ?  ? ? Shower the NIGHT BEFORE SURGERY and the MORNING OF SURGERY  with CHG Soap.  ? If you chose to wash your hair, wash your hair first as usual with your normal shampoo. After you shampoo, rinse your hair and body thoroughly to remove the shampoo.  Then [employer] and genitals (private parts) with your normal soap and rinse thoroughly to remove soap. ? ?After that Use CHG Soap as you would any other liquid soap. You can apply CHG directly to the skin and wash gently with a scrungie or a clean washcloth.  ? ?Apply the CHG Soap to your body ONLY FROM THE NECK DOWN.  Do not use on open wounds or open sores. Avoid contact with your eyes, ears, mouth and genitals (private parts). Wash Face and genitals (private parts)  with your normal soap.  ? ?Wash thoroughly, paying special attention to the area where your surgery will be performed. ? ?Thoroughly rinse your body with warm water from the neck down. ? ?DO NOT shower/wash with your normal soap after using and rinsing off the CHG Soap. ? ?Pat yourself dry with a CLEAN TOWEL. ? ?Wear CLEAN PAJAMAS to bed the night before surgery ? ?Place CLEAN SHEETS on your bed the night before your surgery ? ?DO NOT SLEEP WITH PETS. ? ? ?Day of Surgery: ? ?Take a shower with CHG soap. ?Wear Clean/Comfortable clothing the morning of surgery ?Do not apply any deodorants/lotions.   ?Remember to brush your teeth WITH YOUR REGULAR TOOTHPASTE. ? ? ? ?If you received a COVID test during your pre-op visit, it is requested that you wear a mask when out in public, stay away from anyone that may not be feeling well, and notify your surgeon if you develop symptoms. If you have been in contact with anyone that has tested positive in the last 10 days, please notify your surgeon. ? ?  ?Please read over the following fact sheets that you were given.   ?

## 2021-08-28 ENCOUNTER — Other Ambulatory Visit (HOSPITAL_COMMUNITY): Payer: PPO

## 2021-08-28 ENCOUNTER — Other Ambulatory Visit: Payer: Self-pay

## 2021-08-28 ENCOUNTER — Ambulatory Visit (HOSPITAL_COMMUNITY)
Admission: RE | Admit: 2021-08-28 | Discharge: 2021-08-28 | Disposition: A | Discharge status: Home / Self Care | Payer: PPO | Source: Ambulatory Visit | Attending: Thoracic Surgery (Cardiothoracic Vascular Surgery) | Admitting: Thoracic Surgery (Cardiothoracic Vascular Surgery)

## 2021-08-28 ENCOUNTER — Encounter (HOSPITAL_COMMUNITY): Payer: Self-pay

## 2021-08-28 ENCOUNTER — Encounter (HOSPITAL_COMMUNITY)
Admission: RE | Admit: 2021-08-28 | Discharge: 2021-08-28 | Disposition: A | Discharge status: Home / Self Care | Payer: PPO | Source: Ambulatory Visit | Attending: Thoracic Surgery (Cardiothoracic Vascular Surgery) | Admitting: Thoracic Surgery (Cardiothoracic Vascular Surgery)

## 2021-08-28 VITALS — BP 138/77 | HR 76 | Temp 98.1°F | Resp 19 | Ht 63.0 in | Wt 197.6 lb

## 2021-08-28 DIAGNOSIS — Z6835 Body mass index (BMI) 35.0-35.9, adult: Secondary | ICD-10-CM | POA: Insufficient documentation

## 2021-08-28 DIAGNOSIS — D6959 Other secondary thrombocytopenia: Secondary | ICD-10-CM | POA: Insufficient documentation

## 2021-08-28 DIAGNOSIS — Z818 Family history of other mental and behavioral disorders: Secondary | ICD-10-CM | POA: Diagnosis not present

## 2021-08-28 DIAGNOSIS — I10 Essential (primary) hypertension: Secondary | ICD-10-CM | POA: Diagnosis present

## 2021-08-28 DIAGNOSIS — Z7901 Long term (current) use of anticoagulants: Secondary | ICD-10-CM | POA: Diagnosis not present

## 2021-08-28 DIAGNOSIS — M170 Bilateral primary osteoarthritis of knee: Secondary | ICD-10-CM | POA: Diagnosis present

## 2021-08-28 DIAGNOSIS — F319 Bipolar disorder, unspecified: Secondary | ICD-10-CM | POA: Diagnosis present

## 2021-08-28 DIAGNOSIS — Z20822 Contact with and (suspected) exposure to covid-19: Secondary | ICD-10-CM | POA: Insufficient documentation

## 2021-08-28 DIAGNOSIS — R918 Other nonspecific abnormal finding of lung field: Secondary | ICD-10-CM | POA: Diagnosis present

## 2021-08-28 DIAGNOSIS — C3432 Malignant neoplasm of lower lobe, left bronchus or lung: Secondary | ICD-10-CM | POA: Diagnosis present

## 2021-08-28 DIAGNOSIS — F419 Anxiety disorder, unspecified: Secondary | ICD-10-CM | POA: Diagnosis present

## 2021-08-28 DIAGNOSIS — J939 Pneumothorax, unspecified: Secondary | ICD-10-CM | POA: Diagnosis not present

## 2021-08-28 DIAGNOSIS — Z79890 Hormone replacement therapy (postmenopausal): Secondary | ICD-10-CM | POA: Diagnosis not present

## 2021-08-28 DIAGNOSIS — Z825 Family history of asthma and other chronic lower respiratory diseases: Secondary | ICD-10-CM | POA: Diagnosis not present

## 2021-08-28 DIAGNOSIS — J449 Chronic obstructive pulmonary disease, unspecified: Secondary | ICD-10-CM | POA: Diagnosis not present

## 2021-08-28 DIAGNOSIS — K7460 Unspecified cirrhosis of liver: Secondary | ICD-10-CM | POA: Insufficient documentation

## 2021-08-28 DIAGNOSIS — J45909 Unspecified asthma, uncomplicated: Secondary | ICD-10-CM | POA: Diagnosis not present

## 2021-08-28 DIAGNOSIS — J90 Pleural effusion, not elsewhere classified: Secondary | ICD-10-CM | POA: Diagnosis not present

## 2021-08-28 DIAGNOSIS — Z86711 Personal history of pulmonary embolism: Secondary | ICD-10-CM | POA: Diagnosis not present

## 2021-08-28 DIAGNOSIS — Z4682 Encounter for fitting and adjustment of non-vascular catheter: Secondary | ICD-10-CM | POA: Diagnosis not present

## 2021-08-28 DIAGNOSIS — R911 Solitary pulmonary nodule: Secondary | ICD-10-CM | POA: Insufficient documentation

## 2021-08-28 DIAGNOSIS — Z01818 Encounter for other preprocedural examination: Secondary | ICD-10-CM

## 2021-08-28 DIAGNOSIS — E669 Obesity, unspecified: Secondary | ICD-10-CM | POA: Insufficient documentation

## 2021-08-28 DIAGNOSIS — R079 Chest pain, unspecified: Secondary | ICD-10-CM | POA: Diagnosis not present

## 2021-08-28 DIAGNOSIS — Z8249 Family history of ischemic heart disease and other diseases of the circulatory system: Secondary | ICD-10-CM | POA: Diagnosis not present

## 2021-08-28 DIAGNOSIS — C3491 Malignant neoplasm of unspecified part of right bronchus or lung: Secondary | ICD-10-CM | POA: Diagnosis not present

## 2021-08-28 DIAGNOSIS — R0789 Other chest pain: Secondary | ICD-10-CM | POA: Diagnosis not present

## 2021-08-28 DIAGNOSIS — J9811 Atelectasis: Secondary | ICD-10-CM | POA: Diagnosis not present

## 2021-08-28 DIAGNOSIS — Z79899 Other long term (current) drug therapy: Secondary | ICD-10-CM | POA: Diagnosis not present

## 2021-08-28 DIAGNOSIS — C771 Secondary and unspecified malignant neoplasm of intrathoracic lymph nodes: Secondary | ICD-10-CM | POA: Diagnosis present

## 2021-08-28 DIAGNOSIS — Z880 Allergy status to penicillin: Secondary | ICD-10-CM | POA: Diagnosis not present

## 2021-08-28 DIAGNOSIS — R161 Splenomegaly, not elsewhere classified: Secondary | ICD-10-CM | POA: Insufficient documentation

## 2021-08-28 DIAGNOSIS — J439 Emphysema, unspecified: Secondary | ICD-10-CM | POA: Diagnosis not present

## 2021-08-28 DIAGNOSIS — Z87891 Personal history of nicotine dependence: Secondary | ICD-10-CM | POA: Diagnosis not present

## 2021-08-28 DIAGNOSIS — E039 Hypothyroidism, unspecified: Secondary | ICD-10-CM | POA: Insufficient documentation

## 2021-08-28 DIAGNOSIS — M25561 Pain in right knee: Secondary | ICD-10-CM | POA: Diagnosis not present

## 2021-08-28 DIAGNOSIS — Z7951 Long term (current) use of inhaled steroids: Secondary | ICD-10-CM | POA: Diagnosis not present

## 2021-08-28 DIAGNOSIS — Z888 Allergy status to other drugs, medicaments and biological substances status: Secondary | ICD-10-CM | POA: Diagnosis not present

## 2021-08-28 DIAGNOSIS — E871 Hypo-osmolality and hyponatremia: Secondary | ICD-10-CM | POA: Diagnosis not present

## 2021-08-28 DIAGNOSIS — R531 Weakness: Secondary | ICD-10-CM | POA: Diagnosis not present

## 2021-08-28 DIAGNOSIS — R0602 Shortness of breath: Secondary | ICD-10-CM | POA: Diagnosis not present

## 2021-08-28 DIAGNOSIS — J4540 Moderate persistent asthma, uncomplicated: Secondary | ICD-10-CM | POA: Diagnosis present

## 2021-08-28 HISTORY — DX: Anxiety disorder, unspecified: F41.9

## 2021-08-28 HISTORY — DX: Essential (primary) hypertension: I10

## 2021-08-28 HISTORY — DX: Hypothyroidism, unspecified: E03.9

## 2021-08-28 HISTORY — DX: Inflammatory liver disease, unspecified: K75.9

## 2021-08-28 LAB — COMPREHENSIVE METABOLIC PANEL
ALT: 22 U/L (ref 0–44)
AST: 32 U/L (ref 15–41)
Albumin: 4 g/dL (ref 3.5–5.0)
Alkaline Phosphatase: 61 U/L (ref 38–126)
Anion gap: 9 (ref 5–15)
BUN: 16 mg/dL (ref 8–23)
CO2: 24 mmol/L (ref 22–32)
Calcium: 9.2 mg/dL (ref 8.9–10.3)
Chloride: 105 mmol/L (ref 98–111)
Creatinine, Ser: 0.65 mg/dL (ref 0.44–1.00)
GFR, Estimated: >60 mL/min (ref >60)
Glucose, Bld: 91 mg/dL (ref 70–99)
Potassium: 3.9 mmol/L (ref 3.5–5.1)
Sodium: 138 mmol/L (ref 135–145)
Total Bilirubin: 1.5 mg/dL — ABNORMAL HIGH (ref 0.3–1.2)
Total Protein: 7 g/dL (ref 6.5–8.1)

## 2021-08-28 LAB — URINALYSIS, ROUTINE W REFLEX MICROSCOPIC
Bilirubin Urine: NEGATIVE
Glucose, UA: NEGATIVE mg/dL
Hgb urine dipstick: NEGATIVE
Ketones, ur: NEGATIVE mg/dL
Leukocytes,Ua: NEGATIVE
Nitrite: NEGATIVE
Protein, ur: NEGATIVE mg/dL
Specific Gravity, Urine: 1.009 (ref 1.005–1.030)
pH: 6 (ref 5.0–8.0)

## 2021-08-28 LAB — SURGICAL PCR SCREEN
MRSA, PCR: NEGATIVE
Staphylococcus aureus: NEGATIVE

## 2021-08-28 LAB — CBC
HCT: 40.9 % (ref 36.0–46.0)
Hemoglobin: 13.6 g/dL (ref 12.0–15.0)
MCH: 30.3 pg (ref 26.0–34.0)
MCHC: 33.3 g/dL (ref 30.0–36.0)
MCV: 91.1 fL (ref 80.0–100.0)
Platelets: 123 10*3/uL — ABNORMAL LOW (ref 150–400)
RBC: 4.49 MIL/uL (ref 3.87–5.11)
RDW: 13.8 % (ref 11.5–15.5)
WBC: 7.8 10*3/uL (ref 4.0–10.5)
nRBC: 0 % (ref 0.0–0.2)

## 2021-08-28 LAB — TYPE AND SCREEN
ABO/RH(D): A POS
Antibody Screen: NEGATIVE

## 2021-08-28 LAB — PROTIME-INR
INR: 1.3 — ABNORMAL HIGH (ref 0.8–1.2)
Prothrombin Time: 16.4 seconds — ABNORMAL HIGH (ref 11.4–15.2)

## 2021-08-28 LAB — APTT: aPTT: 34 seconds (ref 24–36)

## 2021-08-28 LAB — SARS CORONAVIRUS 2 BY RT PCR (HOSPITAL ORDER, PERFORMED IN ~~LOC~~ HOSPITAL LAB): SARS Coronavirus 2: NEGATIVE

## 2021-08-28 NOTE — Progress Notes (Signed)
Anesthesia Chart Review: ? Case: [id number] Date/Time: 08/31/21 0715  ? Procedure: XI ROBOTIC ASSISTED THORACOSCOPY- LEFT LOWER LOBE SUPERIOR SEGMENTECTOMY (Left: Chest)  ? Anesthesia type: General  ? Pre-op diagnosis: Left Lower Lobe Lung Nodule  ? Location: [facility] ROOM 10 / [facility]  ? Surgeons: [name], MD  ? ?  ? ? ?DISCUSSION: Patient is a 61 year old female scheduled for the above procedure.  She had a LLL lung nodule found on June 2022 CTA at the time of her PE diagnosis.  Follow-up imaging in 6 months revealed the nodule had increased in size and was hypermetabolic on PET scan.  Above procedure recommended. ? ?History includes never smoker, HTN, hypothyroidism, hepatitis C (treated), liver cirrhosis (with thrombocytopenia), PE (11/04/20), bipolar affective disorder.  BMI is consistent with obesity. ? ?Dr. Roxan Hockey classified her Zubrod score as 1: Restricted in physical strenuous activity but ambulatory, able to do out light work. ? ?He recommended she hold Eliquis for 48 hours prior to surgery and resume postoperatively as soon as it is safe. ? ?Anesthesia team to evaluate on the day of surgery.  08/28/2021 chest x-ray and COVID-19 test are still in process. ? ? ?VS: BP 138/77   Pulse 76   Temp 36.7 ?C (Oral)   Resp 19   Ht 5\' 3"  (1.6 m)   Wt 89.6 kg   SpO2 98%   BMI 35.00 kg/m?  ? ? ?PROVIDERS: ?[name], PA-C is PCP.  Last visit 08/26/2021.  She is aware of plans for surgery. ? ?Lavera Guise, MD is HEM-ONC.  Saw on 08/07/2021 for thrombocytopenia due to hepatitis C induced cirrhosis and secondary splenomegaly.  Her platelet counts have been stable for several years.  Yearly follow-up recommended. ? ? ?LABS: Labs reviewed: Acceptable for surgery. ?(all labs ordered are listed, but only abnormal results are displayed) ? ?Labs Reviewed  ?CBC - Abnormal; Notable for the following components:  ?    Result Value  ? Platelets 123 (*)   ? All other components within normal limits  ?COMPREHENSIVE  METABOLIC PANEL - Abnormal; Notable for the following components:  ? Total Bilirubin 1.5 (*)   ? All other components within normal limits  ?PROTIME-INR - Abnormal; Notable for the following components:  ? Prothrombin Time 16.4 (*)   ? INR 1.3 (*)   ? All other components within normal limits  ?URINALYSIS, ROUTINE W REFLEX MICROSCOPIC - Abnormal; Notable for the following components:  ? Color, Urine STRAW (*)   ? All other components within normal limits  ?SURGICAL PCR SCREEN  ?SARS CORONAVIRUS 2 BY RT PCR  ?APTT  ?TYPE AND SCREEN  ? ? ?PFTs 08/25/21: FVC 1.88 (59%), post 1.85 (58%). FEV1 1.56 (64%), post 1.55 (63%), DLCO unc 11.58 (59%), cor 11.80 (60%).  ? ? ?IMAGES: ?CXR 08/28/21: In process. ? ?PET Scan 07/29/21: ?MPRESSION: ?1. Moderate increase in size of the superior segment left lower lobe ?nodule, currently 1.7 cc in volume and previously 1.1 cc in volume ?on 11/04/2020. This is probably a low-grade adenocarcinoma, and ?abuts the major fissure. ?2. Benign 6 mm left lower lobe pulmonary nodule is stable from 2014. ?3. A sclerotic lesion in the T8 vertebral body is stable from ?11/04/2020 but new from 2014. This lesion was not hypermetabolic on ?the PET-CT of 12/04/2020, and is probably a bone island or similar ?benign lesion. ?4. Other imaging findings of potential clinical significance: Aortic ?Atherosclerosis (ICD10-I70.0) and Emphysema (ICD10-J43.9). Coronary ?atherosclerosis. Cirrhosis. Uphill paraesophageal varices and ?subcutaneous venous collateral  structures in the thoracoabdominal ?wall anteriorly. Probable splenomegaly. Subtle mosaic attenuation in ?the lungs, possibly from mild air trapping. ? ?CT Chest 07/02/21 [facility]): ?IMPRESSION: ?1. Moderate increase in size of the superior segment left lower lobe ?nodule, currently 1.7 cc in volume and previously 1.1 cc in volume ?on 11/04/2020. This is probably a low-grade adenocarcinoma, and ?abuts the major fissure. ?2. Benign 6 mm left lower lobe  pulmonary nodule is stable from 2014. ?3. A sclerotic lesion in the T8 vertebral body is stable from ?11/04/2020 but new from 2014. This lesion was not hypermetabolic on ?the PET-CT of 12/04/2020, and is probably a bone island or similar ?benign lesion. ?4. Other imaging findings of potential clinical significance: Aortic ?Atherosclerosis (ICD10-I70.0) and Emphysema (ICD10-J43.9). Coronary ?atherosclerosis. Cirrhosis. Uphill paraesophageal varices and ?subcutaneous venous collateral structures in the thoracoabdominal ?wall anteriorly. Probable splenomegaly. Subtle mosaic attenuation in ?the lungs, possibly from mild air trapping. ? ? ?EKG: 08/28/21: ?Normal sinus rhythm ?When compared with ECG of 11-Apr-2007 04:30, ?No significant change was found ?Confirmed by Minus Breeding [phone]) on 08/28/2021 3:01:45 PM ? ? ?CV: ?LE Venous US 11/06/20: ?IMPRESSION: ?No evidence of deep venous thrombosis in either lower extremity.  ? ?Remote history of a stress test in 2007. ? ? ?Past Medical History:  ?Diagnosis Date  ? Anxiety   ? Arthritis   ? Asthma   ? Bipolar affective disorder ([place])   ? Cirrhosis of liver due to hepatitis C   ? Hepatitis   ? Hypertension   ? Hypothyroidism   ? Pulmonary embolus ([place]) 10/2020  ? Thyroid disease   ? ? ?Past Surgical History:  ?Procedure Laterality Date  ? ABDOMINAL HYSTERECTOMY    ? ABDOMINAL SURGERY    ? APPENDECTOMY    ? CHOLECYSTECTOMY    ? ? ?MEDICATIONS: ? acetaminophen (TYLENOL) 500 MG tablet  ? albuterol (PROVENTIL) (2.5 MG/3ML) 0.083% nebulizer solution  ? albuterol (VENTOLIN HFA) 108 (90 Base) MCG/ACT inhaler  ? amLODipine (NORVASC) 5 MG tablet  ? betamethasone dipropionate (DIPROLENE) 0.05 % ointment  ? ELIQUIS 5 MG TABS tablet  ? levothyroxine (SYNTHROID) 100 MCG tablet  ? LORazepam (ATIVAN) 1 MG tablet  ? losartan-hydrochlorothiazide (HYZAAR) 100-25 MG tablet  ? oxyCODONE (OXY IR/ROXICODONE) 5 MG immediate release tablet  ? Polyethyl Glycol-Propyl Glycol (LUBRICANT EYE DROPS)  0.4-0.3 % SOLN  ? potassium chloride SA (KLOR-CON M) 20 MEQ tablet  ? promethazine (PHENERGAN) 25 MG tablet  ? SYMBICORT 160-4.5 MCG/ACT inhaler  ? Vitamin D, Ergocalciferol, (DRISDOL) 1.25 MG (50000 UNIT) CAPS capsule  ? ?No current facility-administered medications for this encounter.  ? ? ?Myra Gianotti, PA-C ?Surgical Short Stay/Anesthesiology ?[facility] Phone [phone] ?[facility] Phone [phone] ?08/28/2021 3:53 PM ? ? ? ? ? ? ? ?

## 2021-08-28 NOTE — Progress Notes (Signed)
PCP - [name] PA-C ?Cardiologist -  ? ?PPM/ICD - denies ?Device Orders -  ?Rep Notified -  ? ?Chest x-ray - 08/28/21 ?EKG - 08/26/21 ?Stress Test - none ?ECHO - none ?Cardiac Cath - none ? ?Sleep Study - 03/23/21 ?CPAP - pt says she cannot wear one. ? ?Fasting Blood Sugar - n/a ?Checks Blood Sugar _____ times a day ? ?Blood Thinner Instructions:stop Eliquis two days prior to surgery. Pt states last dose is 08/28/21. ?Aspirin Instructions:n/a ? ?ERAS Protcol -no ?PRE-SURGERY Ensure or G2-  ? ?COVID TEST- 08/29/22 ? ? ?Anesthesia review: yes ? ?Patient denies shortness of breath, fever, cough and chest pain at PAT appointment ? ? ?All instructions explained to the patient, with a verbal understanding of the material. Patient agrees to go over the instructions while at home for a better understanding. Patient also instructed to self quarantine after being tested for COVID-19. The opportunity to ask questions was provided. ?  ?

## 2021-08-28 NOTE — Anesthesia Preprocedure Evaluation (Addendum)
Anesthesia Evaluation  ?Patient identified by MRN, date of birth, ID band ?Patient awake ? ? ? ?Reviewed: ?Allergy & Precautions, NPO status , Patient's Chart, lab work & pertinent test results ? ?Airway ?Mallampati: II ? ?TM Distance: >3 FB ?Neck ROM: Full ? ? ? Dental ?no notable dental hx. ? ?  ?Pulmonary ?asthma , COPD,  ?  ?Pulmonary exam normal ?breath sounds clear to auscultation ? ? ? ? ? ? Cardiovascular ?hypertension, Pt. on medications ?negative cardio ROS ?Normal cardiovascular exam ?Rhythm:Regular Rate:Normal ? ? ?  ?Neuro/Psych ?Anxiety Bipolar Disorder negative neurological ROS ? negative psych ROS  ? GI/Hepatic ?Neg liver ROS, GERD  ,  ?Endo/Other  ?Hypothyroidism  ? Renal/GU ?negative Renal ROS  ?negative genitourinary ?  ?Musculoskeletal ? ?(+) Arthritis , Osteoarthritis,   ? Abdominal ?(+) + obese,   ?Peds ?negative pediatric ROS ?(+)  Hematology ?negative hematology ROS ?(+)   ?Anesthesia Other Findings ? ? Reproductive/Obstetrics ?negative OB ROS ? ?  ? ? ? ? ? ? ? ? ? ? ? ? ? ?  ?  ? ? ? ? ? ? ? ?Anesthesia Physical ?Anesthesia Plan ? ?ASA: 3 ? ?Anesthesia Plan: General  ? ?Post-op Pain Management:   ? ?Induction: Intravenous ? ?PONV Risk Score and Plan: 3 and Ondansetron, Dexamethasone, Midazolam and Treatment may vary due to age or medical condition ? ?Airway Management Planned: Oral ETT ? ?Additional Equipment: Arterial line ? ?Intra-op Plan:  ? ?Post-operative Plan: Extubation in OR ? ?Informed Consent: I have reviewed the patients History and Physical, chart, labs and discussed the procedure including the risks, benefits and alternatives for the proposed anesthesia with the patient or authorized representative who has indicated his/her understanding and acceptance.  ? ? ? ?Dental advisory given ? ?Plan Discussed with: CRNA ? ?Anesthesia Plan Comments: (PAT note written 08/28/2021 by Myra Gianotti, PA-C. ?)  ? ? ? ? ? ?Anesthesia Quick Evaluation ? ?

## 2021-08-31 ENCOUNTER — Encounter (HOSPITAL_COMMUNITY)
Admission: RE | Disposition: A | Discharge status: Home / Self Care | Payer: Self-pay | Source: Home / Self Care | Attending: Thoracic Surgery (Cardiothoracic Vascular Surgery)

## 2021-08-31 ENCOUNTER — Encounter (HOSPITAL_COMMUNITY): Payer: Self-pay | Admitting: Thoracic Surgery (Cardiothoracic Vascular Surgery)

## 2021-08-31 ENCOUNTER — Inpatient Hospital Stay (HOSPITAL_COMMUNITY): Payer: PPO | Admitting: Vascular Surgery

## 2021-08-31 ENCOUNTER — Other Ambulatory Visit: Payer: Self-pay

## 2021-08-31 ENCOUNTER — Inpatient Hospital Stay (HOSPITAL_COMMUNITY)
Admission: RE | Admit: 2021-08-31 | Discharge: 2021-09-04 | DRG: 164 | Disposition: A | Discharge status: Home / Self Care | Payer: PPO | Attending: Thoracic Surgery (Cardiothoracic Vascular Surgery) | Admitting: Thoracic Surgery (Cardiothoracic Vascular Surgery)

## 2021-08-31 ENCOUNTER — Inpatient Hospital Stay (HOSPITAL_COMMUNITY): Payer: PPO

## 2021-08-31 DIAGNOSIS — Z20822 Contact with and (suspected) exposure to covid-19: Secondary | ICD-10-CM | POA: Diagnosis not present

## 2021-08-31 DIAGNOSIS — Z8249 Family history of ischemic heart disease and other diseases of the circulatory system: Secondary | ICD-10-CM | POA: Diagnosis not present

## 2021-08-31 DIAGNOSIS — I454 Nonspecific intraventricular block: Secondary | ICD-10-CM | POA: Diagnosis not present

## 2021-08-31 DIAGNOSIS — R0789 Other chest pain: Secondary | ICD-10-CM | POA: Diagnosis not present

## 2021-08-31 DIAGNOSIS — C3432 Malignant neoplasm of lower lobe, left bronchus or lung: Secondary | ICD-10-CM | POA: Diagnosis present

## 2021-08-31 DIAGNOSIS — E039 Hypothyroidism, unspecified: Secondary | ICD-10-CM | POA: Diagnosis present

## 2021-08-31 DIAGNOSIS — J90 Pleural effusion, not elsewhere classified: Secondary | ICD-10-CM | POA: Diagnosis not present

## 2021-08-31 DIAGNOSIS — Z86711 Personal history of pulmonary embolism: Secondary | ICD-10-CM | POA: Diagnosis not present

## 2021-08-31 DIAGNOSIS — J939 Pneumothorax, unspecified: Secondary | ICD-10-CM | POA: Diagnosis not present

## 2021-08-31 DIAGNOSIS — Z7901 Long term (current) use of anticoagulants: Secondary | ICD-10-CM

## 2021-08-31 DIAGNOSIS — F419 Anxiety disorder, unspecified: Secondary | ICD-10-CM | POA: Diagnosis present

## 2021-08-31 DIAGNOSIS — J9811 Atelectasis: Secondary | ICD-10-CM | POA: Diagnosis not present

## 2021-08-31 DIAGNOSIS — Z880 Allergy status to penicillin: Secondary | ICD-10-CM | POA: Diagnosis not present

## 2021-08-31 DIAGNOSIS — M170 Bilateral primary osteoarthritis of knee: Secondary | ICD-10-CM | POA: Diagnosis present

## 2021-08-31 DIAGNOSIS — J449 Chronic obstructive pulmonary disease, unspecified: Secondary | ICD-10-CM

## 2021-08-31 DIAGNOSIS — Z6834 Body mass index (BMI) 34.0-34.9, adult: Secondary | ICD-10-CM

## 2021-08-31 DIAGNOSIS — Z7951 Long term (current) use of inhaled steroids: Secondary | ICD-10-CM | POA: Diagnosis not present

## 2021-08-31 DIAGNOSIS — E871 Hypo-osmolality and hyponatremia: Secondary | ICD-10-CM | POA: Diagnosis not present

## 2021-08-31 DIAGNOSIS — Z825 Family history of asthma and other chronic lower respiratory diseases: Secondary | ICD-10-CM

## 2021-08-31 DIAGNOSIS — Z4682 Encounter for fitting and adjustment of non-vascular catheter: Secondary | ICD-10-CM | POA: Diagnosis not present

## 2021-08-31 DIAGNOSIS — Z818 Family history of other mental and behavioral disorders: Secondary | ICD-10-CM | POA: Diagnosis not present

## 2021-08-31 DIAGNOSIS — I10 Essential (primary) hypertension: Secondary | ICD-10-CM | POA: Diagnosis present

## 2021-08-31 DIAGNOSIS — C771 Secondary and unspecified malignant neoplasm of intrathoracic lymph nodes: Secondary | ICD-10-CM | POA: Diagnosis present

## 2021-08-31 DIAGNOSIS — F319 Bipolar disorder, unspecified: Secondary | ICD-10-CM | POA: Diagnosis present

## 2021-08-31 DIAGNOSIS — E669 Obesity, unspecified: Secondary | ICD-10-CM | POA: Diagnosis present

## 2021-08-31 DIAGNOSIS — R911 Solitary pulmonary nodule: Secondary | ICD-10-CM | POA: Diagnosis not present

## 2021-08-31 DIAGNOSIS — Z79899 Other long term (current) drug therapy: Secondary | ICD-10-CM

## 2021-08-31 DIAGNOSIS — R918 Other nonspecific abnormal finding of lung field: Secondary | ICD-10-CM | POA: Diagnosis present

## 2021-08-31 DIAGNOSIS — Z79890 Hormone replacement therapy (postmenopausal): Secondary | ICD-10-CM

## 2021-08-31 DIAGNOSIS — Z888 Allergy status to other drugs, medicaments and biological substances status: Secondary | ICD-10-CM

## 2021-08-31 DIAGNOSIS — Z87891 Personal history of nicotine dependence: Secondary | ICD-10-CM

## 2021-08-31 DIAGNOSIS — G8929 Other chronic pain: Secondary | ICD-10-CM

## 2021-08-31 DIAGNOSIS — J439 Emphysema, unspecified: Secondary | ICD-10-CM | POA: Diagnosis not present

## 2021-08-31 DIAGNOSIS — K7460 Unspecified cirrhosis of liver: Secondary | ICD-10-CM | POA: Diagnosis not present

## 2021-08-31 DIAGNOSIS — Z902 Acquired absence of lung [part of]: Principal | ICD-10-CM

## 2021-08-31 DIAGNOSIS — R0602 Shortness of breath: Secondary | ICD-10-CM | POA: Diagnosis not present

## 2021-08-31 DIAGNOSIS — J4540 Moderate persistent asthma, uncomplicated: Secondary | ICD-10-CM | POA: Diagnosis not present

## 2021-08-31 DIAGNOSIS — R079 Chest pain, unspecified: Secondary | ICD-10-CM | POA: Diagnosis not present

## 2021-08-31 DIAGNOSIS — Z8619 Personal history of other infectious and parasitic diseases: Secondary | ICD-10-CM

## 2021-08-31 HISTORY — PX: INTERCOSTAL NERVE BLOCK: SHX5021

## 2021-08-31 HISTORY — PX: XI ROBOTIC ASSISTED THORACOSCOPY- SEGMENTECTOMY: SHX6881

## 2021-08-31 HISTORY — PX: NODE DISSECTION: SHX5269

## 2021-08-31 LAB — ABO/RH: ABO/RH(D): A POS

## 2021-08-31 LAB — BLOOD GAS, ARTERIAL
Acid-Base Excess: 1 mmol/L (ref 0.0–2.0)
Bicarbonate: 25.2 mmol/L (ref 20.0–28.0)
Drawn by: 45301
O2 Saturation: 98.4 %
Patient temperature: 37
pCO2 arterial: 38 mmHg (ref 32–48)
pH, Arterial: 7.43 (ref 7.35–7.45)
pO2, Arterial: 84 mmHg (ref 83–108)

## 2021-08-31 SURGERY — RESECTION, LUNG, SEGMENTAL, ROBOT-ASSISTED
Anesthesia: General | Site: Chest | Laterality: Left

## 2021-08-31 MED ORDER — OXYCODONE HCL 5 MG/5ML PO SOLN
ORAL | Status: AC
  Filled 2021-08-31: qty 5

## 2021-08-31 MED ORDER — IPRATROPIUM-ALBUTEROL 0.5-2.5 (3) MG/3ML IN SOLN
3.0000 mL | Freq: Four times a day (QID) | RESPIRATORY_TRACT | Status: DC
  Administered 2021-08-31 – 2021-09-01 (×4): 3 mL via RESPIRATORY_TRACT
  Filled 2021-08-31 (×4): qty 3

## 2021-08-31 MED ORDER — SODIUM CHLORIDE 0.9 % IR SOLN
Status: DC | PRN
  Administered 2021-08-31 (×2): 1

## 2021-08-31 MED ORDER — MOMETASONE FURO-FORMOTEROL FUM 200-5 MCG/ACT IN AERO
2.0000 | INHALATION_SPRAY | Freq: Two times a day (BID) | RESPIRATORY_TRACT | Status: DC
Start: 2021-08-31 — End: 2021-09-04
  Administered 2021-08-31 – 2021-09-04 (×8): 2 via RESPIRATORY_TRACT
  Filled 2021-08-31: qty 8.8

## 2021-08-31 MED ORDER — OXYCODONE HCL 5 MG PO TABS
5.0000 mg | ORAL_TABLET | ORAL | Status: DC | PRN
  Administered 2021-08-31 – 2021-09-01 (×4): 10 mg via ORAL
  Administered 2021-09-01: 5 mg via ORAL
  Administered 2021-09-01 – 2021-09-02 (×4): 10 mg via ORAL
  Administered 2021-09-03 – 2021-09-04 (×6): 5 mg via ORAL
  Filled 2021-08-31 (×2): qty 2
  Filled 2021-08-31 (×3): qty 1
  Filled 2021-08-31 (×4): qty 2
  Filled 2021-08-31 (×2): qty 1
  Filled 2021-08-31 (×2): qty 2
  Filled 2021-08-31: qty 1
  Filled 2021-08-31: qty 2

## 2021-08-31 MED ORDER — VANCOMYCIN HCL IN DEXTROSE 1-5 GM/200ML-% IV SOLN
1000.0000 mg | INTRAVENOUS | Status: AC
  Administered 2021-08-31: 1000 mg via INTRAVENOUS
  Filled 2021-08-31: qty 200

## 2021-08-31 MED ORDER — ONDANSETRON HCL 4 MG/2ML IJ SOLN
INTRAMUSCULAR | Status: DC | PRN
  Administered 2021-08-31: 4 mg via INTRAVENOUS

## 2021-08-31 MED ORDER — PHENYLEPHRINE HCL-NACL 20-0.9 MG/250ML-% IV SOLN
INTRAVENOUS | Status: DC | PRN
  Administered 2021-08-31: 50 ug/min via INTRAVENOUS

## 2021-08-31 MED ORDER — LIDOCAINE 2% (20 MG/ML) 5 ML SYRINGE
INTRAMUSCULAR | Status: DC | PRN
  Administered 2021-08-31: 80 mg via INTRAVENOUS

## 2021-08-31 MED ORDER — 0.9 % SODIUM CHLORIDE (POUR BTL) OPTIME
TOPICAL | Status: DC | PRN
Start: 2021-08-31 — End: 2021-08-31
  Administered 2021-08-31: 2000 mL

## 2021-08-31 MED ORDER — DEXAMETHASONE SODIUM PHOSPHATE 10 MG/ML IJ SOLN
INTRAMUSCULAR | Status: DC | PRN
  Administered 2021-08-31: 10 mg via INTRAVENOUS

## 2021-08-31 MED ORDER — FENTANYL CITRATE (PF) 250 MCG/5ML IJ SOLN
INTRAMUSCULAR | Status: DC | PRN
  Administered 2021-08-31 (×2): 50 ug via INTRAVENOUS
  Administered 2021-08-31: 100 ug via INTRAVENOUS
  Administered 2021-08-31: 50 ug via INTRAVENOUS

## 2021-08-31 MED ORDER — ENOXAPARIN SODIUM 40 MG/0.4ML IJ SOSY: 40.0000 mg | PREFILLED_SYRINGE | Freq: Every day | INTRAMUSCULAR | Status: DC

## 2021-08-31 MED ORDER — BUPIVACAINE HCL (PF) 0.5 % IJ SOLN
INTRAMUSCULAR | Status: AC
  Filled 2021-08-31: qty 30

## 2021-08-31 MED ORDER — LACTATED RINGERS IV SOLN: INTRAVENOUS | Status: DC

## 2021-08-31 MED ORDER — LOSARTAN POTASSIUM 50 MG PO TABS
100.0000 mg | ORAL_TABLET | Freq: Every day | ORAL | Status: DC
  Administered 2021-09-01 – 2021-09-04 (×4): 100 mg via ORAL
  Filled 2021-08-31 (×4): qty 2

## 2021-08-31 MED ORDER — PROPOFOL 10 MG/ML IV BOLUS
INTRAVENOUS | Status: DC | PRN
  Administered 2021-08-31: 150 mg via INTRAVENOUS

## 2021-08-31 MED ORDER — SENNOSIDES-DOCUSATE SODIUM 8.6-50 MG PO TABS
1.0000 | ORAL_TABLET | Freq: Every day | ORAL | Status: DC
  Administered 2021-08-31 – 2021-09-01 (×2): 1 via ORAL
  Filled 2021-08-31 (×2): qty 1

## 2021-08-31 MED ORDER — HYDROMORPHONE HCL 1 MG/ML IJ SOLN
0.2500 mg | INTRAMUSCULAR | Status: DC | PRN
  Administered 2021-08-31: 0.5 mg via INTRAVENOUS

## 2021-08-31 MED ORDER — ONDANSETRON HCL 4 MG/2ML IJ SOLN: 4.0000 mg | Freq: Four times a day (QID) | INTRAMUSCULAR | Status: DC | PRN

## 2021-08-31 MED ORDER — PROPOFOL 10 MG/ML IV BOLUS
INTRAVENOUS | Status: AC
  Filled 2021-08-31: qty 20

## 2021-08-31 MED ORDER — BISACODYL 5 MG PO TBEC
10.0000 mg | DELAYED_RELEASE_TABLET | Freq: Every day | ORAL | Status: DC
  Administered 2021-08-31 – 2021-09-01 (×2): 10 mg via ORAL
  Filled 2021-08-31 (×2): qty 2

## 2021-08-31 MED ORDER — HYDROMORPHONE HCL 1 MG/ML IJ SOLN
INTRAMUSCULAR | Status: AC
  Filled 2021-08-31: qty 1

## 2021-08-31 MED ORDER — LEVOTHYROXINE SODIUM 100 MCG PO TABS
100.0000 ug | ORAL_TABLET | Freq: Every day | ORAL | Status: DC
  Administered 2021-09-01 – 2021-09-04 (×4): 100 ug via ORAL
  Filled 2021-08-31 (×4): qty 1

## 2021-08-31 MED ORDER — HEMOSTATIC AGENTS (NO CHARGE) OPTIME
TOPICAL | Status: DC | PRN
  Administered 2021-08-31: 4 via TOPICAL

## 2021-08-31 MED ORDER — AMISULPRIDE (ANTIEMETIC) 5 MG/2ML IV SOLN: 10.0000 mg | Freq: Once | INTRAVENOUS | Status: DC | PRN

## 2021-08-31 MED ORDER — FENTANYL CITRATE (PF) 250 MCG/5ML IJ SOLN
INTRAMUSCULAR | Status: AC
  Filled 2021-08-31: qty 5

## 2021-08-31 MED ORDER — KETOROLAC TROMETHAMINE 15 MG/ML IJ SOLN
15.0000 mg | Freq: Four times a day (QID) | INTRAMUSCULAR | Status: AC
  Administered 2021-08-31 – 2021-09-02 (×8): 15 mg via INTRAVENOUS
  Filled 2021-08-31 (×8): qty 1

## 2021-08-31 MED ORDER — ACETAMINOPHEN 160 MG/5ML PO SOLN: 1000.0000 mg | Freq: Four times a day (QID) | ORAL | Status: DC

## 2021-08-31 MED ORDER — ONDANSETRON HCL 4 MG/2ML IJ SOLN
INTRAMUSCULAR | Status: AC
  Filled 2021-08-31: qty 2

## 2021-08-31 MED ORDER — PANTOPRAZOLE SODIUM 40 MG PO TBEC
40.0000 mg | DELAYED_RELEASE_TABLET | Freq: Every day | ORAL | Status: DC
  Administered 2021-08-31 – 2021-09-04 (×5): 40 mg via ORAL
  Filled 2021-08-31 (×5): qty 1

## 2021-08-31 MED ORDER — MIDAZOLAM HCL 2 MG/2ML IJ SOLN
INTRAMUSCULAR | Status: AC
  Filled 2021-08-31: qty 2

## 2021-08-31 MED ORDER — DEXTROSE-NACL 5-0.9 % IV SOLN: INTRAVENOUS | Status: DC

## 2021-08-31 MED ORDER — ROCURONIUM BROMIDE 10 MG/ML (PF) SYRINGE
PREFILLED_SYRINGE | INTRAVENOUS | Status: DC | PRN
  Administered 2021-08-31: 10 mg via INTRAVENOUS
  Administered 2021-08-31: 100 mg via INTRAVENOUS

## 2021-08-31 MED ORDER — AMLODIPINE BESYLATE 5 MG PO TABS
5.0000 mg | ORAL_TABLET | Freq: Every evening | ORAL | Status: DC
  Administered 2021-08-31 – 2021-09-03 (×4): 5 mg via ORAL
  Filled 2021-08-31 (×4): qty 1

## 2021-08-31 MED ORDER — TRAMADOL HCL 50 MG PO TABS
50.0000 mg | ORAL_TABLET | Freq: Four times a day (QID) | ORAL | Status: DC | PRN
  Administered 2021-08-31: 100 mg via ORAL
  Filled 2021-08-31: qty 2

## 2021-08-31 MED ORDER — LIDOCAINE 2% (20 MG/ML) 5 ML SYRINGE
INTRAMUSCULAR | Status: AC
  Filled 2021-08-31: qty 5

## 2021-08-31 MED ORDER — KETOROLAC TROMETHAMINE 15 MG/ML IJ SOLN
INTRAMUSCULAR | Status: AC
  Filled 2021-08-31: qty 1

## 2021-08-31 MED ORDER — LORAZEPAM 1 MG PO TABS
1.0000 mg | ORAL_TABLET | Freq: Three times a day (TID) | ORAL | Status: DC | PRN
  Administered 2021-08-31 – 2021-09-04 (×4): 1 mg via ORAL
  Filled 2021-08-31 (×4): qty 1

## 2021-08-31 MED ORDER — SUGAMMADEX SODIUM 200 MG/2ML IV SOLN
INTRAVENOUS | Status: DC | PRN
Start: 2021-08-31 — End: 2021-08-31
  Administered 2021-08-31: 200 mg via INTRAVENOUS

## 2021-08-31 MED ORDER — ENOXAPARIN SODIUM 40 MG/0.4ML IJ SOSY
40.0000 mg | PREFILLED_SYRINGE | Freq: Every day | INTRAMUSCULAR | Status: DC
Start: 2021-08-31 — End: 2021-09-03
  Administered 2021-08-31 – 2021-09-02 (×3): 40 mg via SUBCUTANEOUS
  Filled 2021-08-31 (×3): qty 0.4

## 2021-08-31 MED ORDER — SODIUM CHLORIDE FLUSH 0.9 % IV SOLN
INTRAVENOUS | Status: DC | PRN
  Administered 2021-08-31: 100 mL

## 2021-08-31 MED ORDER — CHLORHEXIDINE GLUCONATE 0.12 % MT SOLN
15.0000 mL | Freq: Once | OROMUCOSAL | Status: AC
  Administered 2021-08-31: 15 mL via OROMUCOSAL
  Filled 2021-08-31: qty 15

## 2021-08-31 MED ORDER — LOSARTAN POTASSIUM-HCTZ 100-25 MG PO TABS: 1.0000 | ORAL_TABLET | Freq: Every day | ORAL | Status: DC

## 2021-08-31 MED ORDER — ACETAMINOPHEN 500 MG PO TABS
1000.0000 mg | ORAL_TABLET | Freq: Four times a day (QID) | ORAL | Status: DC
  Administered 2021-08-31 – 2021-09-04 (×13): 1000 mg via ORAL
  Filled 2021-08-31 (×13): qty 2

## 2021-08-31 MED ORDER — OXYCODONE HCL 5 MG/5ML PO SOLN
5.0000 mg | Freq: Once | ORAL | Status: AC | PRN
  Administered 2021-08-31: 5 mg via ORAL

## 2021-08-31 MED ORDER — ROCURONIUM BROMIDE 10 MG/ML (PF) SYRINGE
PREFILLED_SYRINGE | INTRAVENOUS | Status: AC
  Filled 2021-08-31: qty 10

## 2021-08-31 MED ORDER — VANCOMYCIN HCL IN DEXTROSE 1-5 GM/200ML-% IV SOLN
1000.0000 mg | Freq: Two times a day (BID) | INTRAVENOUS | Status: AC
  Administered 2021-08-31: 1000 mg via INTRAVENOUS
  Filled 2021-08-31: qty 200

## 2021-08-31 MED ORDER — MIDAZOLAM HCL 2 MG/2ML IJ SOLN
INTRAMUSCULAR | Status: DC | PRN
  Administered 2021-08-31: 2 mg via INTRAVENOUS

## 2021-08-31 MED ORDER — OXYCODONE HCL 5 MG PO TABS: 5.0000 mg | ORAL_TABLET | Freq: Once | ORAL | Status: AC | PRN

## 2021-08-31 MED ORDER — HYDROCHLOROTHIAZIDE 25 MG PO TABS
25.0000 mg | ORAL_TABLET | Freq: Every day | ORAL | Status: DC
  Administered 2021-09-01 – 2021-09-04 (×4): 25 mg via ORAL
  Filled 2021-08-31 (×4): qty 1

## 2021-08-31 MED ORDER — ORAL CARE MOUTH RINSE: 15.0000 mL | Freq: Once | OROMUCOSAL | Status: AC

## 2021-08-31 MED ORDER — PROMETHAZINE HCL 25 MG/ML IJ SOLN: 6.2500 mg | INTRAMUSCULAR | Status: DC | PRN

## 2021-08-31 MED ORDER — BUPIVACAINE LIPOSOME 1.3 % IJ SUSP
INTRAMUSCULAR | Status: AC
  Filled 2021-08-31: qty 20

## 2021-08-31 MED ORDER — MEPERIDINE HCL 25 MG/ML IJ SOLN: 6.2500 mg | INTRAMUSCULAR | Status: DC | PRN

## 2021-08-31 MED ORDER — DEXAMETHASONE SODIUM PHOSPHATE 10 MG/ML IJ SOLN
INTRAMUSCULAR | Status: AC
  Filled 2021-08-31: qty 1

## 2021-08-31 SURGICAL SUPPLY — 105 items
ADH SKN CLS APL DERMABOND .7 (GAUZE/BANDAGES/DRESSINGS) ×1
APPLIER CLIP ROT 10 11.4 M/L (STAPLE)
APR CLP MED LRG 11.4X10 (STAPLE)
BAG SPEC RTRVL C1550 15 (MISCELLANEOUS) ×1
BLADE CLIPPER SURG (BLADE) ×3 IMPLANT
CANISTER SUCT 3000ML PPV (MISCELLANEOUS) ×6 IMPLANT
CANNULA REDUC XI 12-8 STAPL (CANNULA) ×4
CANNULA REDUCER 12-8 DVNC XI (CANNULA) ×4 IMPLANT
CLIP APPLIE ROT 10 11.4 M/L (STAPLE) IMPLANT
CLIP LIGATING HEMO O LOK GREEN (MISCELLANEOUS) ×1 IMPLANT
CNTNR URN SCR LID CUP LEK RST (MISCELLANEOUS) ×10 IMPLANT
CONN ST 1/4X3/8  BEN (MISCELLANEOUS)
CONN ST 1/4X3/8 BEN (MISCELLANEOUS) IMPLANT
CONT SPEC 4OZ STRL OR WHT (MISCELLANEOUS) ×28
COVER TIP SHEARS 8 DVNC (MISCELLANEOUS) IMPLANT
COVER TIP SHEARS 8MM DA VINCI (MISCELLANEOUS) ×2
DEFOGGER SCOPE WARMER CLEARIFY (MISCELLANEOUS) ×3 IMPLANT
DERMABOND ADVANCED (GAUZE/BANDAGES/DRESSINGS) ×1
DERMABOND ADVANCED .7 DNX12 (GAUZE/BANDAGES/DRESSINGS) ×2 IMPLANT
DRAIN CHANNEL 28F RND 3/8 FF (WOUND CARE) ×1 IMPLANT
DRAIN CHANNEL 32F RND 10.7 FF (WOUND CARE) IMPLANT
DRAPE ARM DVNC X/XI (DISPOSABLE) ×8 IMPLANT
DRAPE COLUMN DVNC XI (DISPOSABLE) ×2 IMPLANT
DRAPE CV SPLIT W-CLR ANES SCRN (DRAPES) ×3 IMPLANT
DRAPE DA VINCI XI ARM (DISPOSABLE) ×8
DRAPE DA VINCI XI COLUMN (DISPOSABLE) ×2
DRAPE HALF SHEET 40X57 (DRAPES) ×3 IMPLANT
DRAPE INCISE IOBAN 66X45 STRL (DRAPES) IMPLANT
DRAPE ORTHO SPLIT 77X108 STRL (DRAPES) ×2
DRAPE SURG ORHT 6 SPLT 77X108 (DRAPES) ×2 IMPLANT
ELECT BLADE 6.5 EXT (BLADE) ×1 IMPLANT
ELECT REM PT RETURN 9FT ADLT (ELECTROSURGICAL) ×2
ELECTRODE REM PT RTRN 9FT ADLT (ELECTROSURGICAL) ×2 IMPLANT
GAUZE KITTNER 4X5 RF (MISCELLANEOUS) ×3 IMPLANT
GAUZE SPONGE 4X4 12PLY STRL (GAUZE/BANDAGES/DRESSINGS) ×1 IMPLANT
GLOVE SURG MICRO LTX SZ7.5 (GLOVE) ×6 IMPLANT
GOWN STRL REUS W/ TWL LRG LVL3 (GOWN DISPOSABLE) ×4 IMPLANT
GOWN STRL REUS W/ TWL XL LVL3 (GOWN DISPOSABLE) ×6 IMPLANT
GOWN STRL REUS W/TWL 2XL LVL3 (GOWN DISPOSABLE) ×5 IMPLANT
GOWN STRL REUS W/TWL LRG LVL3 (GOWN DISPOSABLE) ×8
GOWN STRL REUS W/TWL XL LVL3 (GOWN DISPOSABLE) ×4
HEMOSTAT SURGICEL 2X14 (HEMOSTASIS) ×14 IMPLANT
IRRIGATION STRYKERFLOW (MISCELLANEOUS) ×2 IMPLANT
IRRIGATOR STRYKERFLOW (MISCELLANEOUS) ×2
IRRIGATOR SUCT 8 DISP DVNC XI (IRRIGATION / IRRIGATOR) IMPLANT
IRRIGATOR SUCTION 8MM XI DISP (IRRIGATION / IRRIGATOR) ×2
KIT BASIN OR (CUSTOM PROCEDURE TRAY) ×3 IMPLANT
NDL HYPO 25GX1X1/2 BEV (NEEDLE) ×2 IMPLANT
NEEDLE HYPO 25GX1X1/2 BEV (NEEDLE) ×2 IMPLANT
NS IRRIG 1000ML POUR BTL (IV SOLUTION) ×4 IMPLANT
PACK CHEST (CUSTOM PROCEDURE TRAY) ×3 IMPLANT
PAD ARMBOARD 7.5X6 YLW CONV (MISCELLANEOUS) ×6 IMPLANT
PORT ACCESS TROCAR AIRSEAL 12 (TROCAR) IMPLANT
PORT ACCESS TROCAR AIRSEAL 5M (TROCAR)
RELOAD STAPLE 45 2.5 WHT DVNC (STAPLE) IMPLANT
RELOAD STAPLE 45 3.5 BLU DVNC (STAPLE) IMPLANT
RELOAD STAPLE 45 4.3 GRN DVNC (STAPLE) IMPLANT
RELOAD STAPLER 2.5X45 WHT DVNC (STAPLE) ×4 IMPLANT
RELOAD STAPLER 3.5X45 BLU DVNC (STAPLE) ×4 IMPLANT
RELOAD STAPLER 4.3X45 GRN DVNC (STAPLE) ×1 IMPLANT
SCISSORS LAP 5X35 DISP (ENDOMECHANICALS) IMPLANT
SEAL CANN UNIV 5-8 DVNC XI (MISCELLANEOUS) ×4 IMPLANT
SEAL XI 5MM-8MM UNIVERSAL (MISCELLANEOUS) ×4
SET TRI-LUMEN FLTR TB AIRSEAL (TUBING) ×3 IMPLANT
SHEARS HARMONIC HDI 20CM (ELECTROSURGICAL) IMPLANT
SOLUTION ELECTROLUBE (MISCELLANEOUS) ×3 IMPLANT
SPONGE INTESTINAL PEANUT (DISPOSABLE) IMPLANT
SPONGE T-LAP 18X18 ~~LOC~~+RFID (SPONGE) ×5 IMPLANT
SPONGE TONSIL TAPE 1 RFD (DISPOSABLE) IMPLANT
STAPLER 45 SUREFORM CVD (STAPLE) ×2
STAPLER 45 SUREFORM CVD DVNC (STAPLE) IMPLANT
STAPLER CANNULA SEAL DVNC XI (STAPLE) ×4 IMPLANT
STAPLER CANNULA SEAL XI (STAPLE) ×4
STAPLER RELOAD 2.5X45 WHITE (STAPLE) ×8
STAPLER RELOAD 2.5X45 WHT DVNC (STAPLE) ×4
STAPLER RELOAD 3.5X45 BLU DVNC (STAPLE) ×4
STAPLER RELOAD 3.5X45 BLUE (STAPLE) ×8
STAPLER RELOAD 4.3X45 GREEN (STAPLE) ×2
STAPLER RELOAD 4.3X45 GRN DVNC (STAPLE) ×1
SUT PDS AB 3-0 SH 27 (SUTURE) IMPLANT
SUT PROLENE 4 0 RB 1 (SUTURE) ×2
SUT PROLENE 4-0 RB1 .5 CRCL 36 (SUTURE) IMPLANT
SUT SILK  1 MH (SUTURE) ×2
SUT SILK 1 MH (SUTURE) ×4 IMPLANT
SUT SILK 1 TIES 10X30 (SUTURE) ×3 IMPLANT
SUT SILK 2 0 SH (SUTURE) ×1 IMPLANT
SUT SILK 2 0SH CR/8 30 (SUTURE) IMPLANT
SUT SILK 3 0SH CR/8 30 (SUTURE) IMPLANT
SUT VIC AB 1 CTX 36 (SUTURE) ×4
SUT VIC AB 1 CTX36XBRD ANBCTR (SUTURE) IMPLANT
SUT VIC AB 2-0 CTX 36 (SUTURE) ×1 IMPLANT
SUT VIC AB 3-0 MH 27 (SUTURE) IMPLANT
SUT VIC AB 3-0 X1 27 (SUTURE) ×4 IMPLANT
SUT VICRYL 0 TIES 12 18 (SUTURE) ×3 IMPLANT
SUT VICRYL 0 UR6 27IN ABS (SUTURE) ×6 IMPLANT
SUT VICRYL 2 TP 1 (SUTURE) IMPLANT
SYR 20CC LL (SYRINGE) ×6 IMPLANT
SYSTEM RETRIEVAL ANCHOR 15 (MISCELLANEOUS) ×1 IMPLANT
SYSTEM SAHARA CHEST DRAIN ATS (WOUND CARE) ×3 IMPLANT
TAPE CLOTH 4X10 WHT NS (GAUZE/BANDAGES/DRESSINGS) ×3 IMPLANT
TAPE PAPER 3X10 WHT MICROPORE (GAUZE/BANDAGES/DRESSINGS) ×1 IMPLANT
TIP APPLICATOR SPRAY EXTEND 16 (VASCULAR PRODUCTS) IMPLANT
TOWEL GREEN STERILE (TOWEL DISPOSABLE) ×3 IMPLANT
TRAY FOLEY W/BAG SLVR 14FR (SET/KITS/TRAYS/PACK) ×1 IMPLANT
WATER STERILE IRR 1000ML POUR (IV SOLUTION) ×3 IMPLANT

## 2021-08-31 NOTE — Anesthesia Procedure Notes (Signed)
Procedure Name: Intubation ?Date/Time: 08/31/2021 8:02 AM ?Performed by: Lance Coon, CRNA ?Pre-anesthesia Checklist: Patient identified, Emergency Drugs available, Suction available, Patient being monitored and Timeout performed ?Patient Re-evaluated:Patient Re-evaluated prior to induction ?Preoxygenation: Pre-oxygenation with 100% oxygen ?Induction Type: IV induction ?Ventilation: Mask ventilation without difficulty ?Laryngoscope Size: Sabra Heck and 3 ?Grade View: Grade I ?Tube type: Oral ?Endobronchial tube: Left, Double lumen EBT, EBT position confirmed by fiberoptic bronchoscope and EBT position confirmed by auscultation and 35 Fr ?Number of attempts: 1 ?Airway Equipment and Method: Stylet ?Placement Confirmation: ETT inserted through vocal cords under direct vision, positive ETCO2 and breath sounds checked- equal and bilateral ?Tube secured with: Tape ?Dental Injury: Teeth and Oropharynx as per pre-operative assessment  ? ? ? ? ?

## 2021-08-31 NOTE — Transfer of Care (Signed)
Immediate Anesthesia Transfer of Care Note ? ?Patient: Natalie Perry ? ?Procedure(s) Performed: XI ROBOTIC ASSISTED THORACOSCOPY- LEFT LOWER LOBE LOBECTOMY (Left: Chest) ?INTERCOSTAL NERVE BLOCK (Left: Chest) ?NODE DISSECTION (Left: Chest) ? ?Patient Location: PACU ? ?Anesthesia Type:General ? ?Level of Consciousness: drowsy and patient cooperative ? ?Airway & Oxygen Therapy: Patient Spontanous Breathing ? ?Post-op Assessment: Report given to RN and Post -op Vital signs reviewed and stable ? ?Post vital signs: Reviewed and stable ? ?Last Vitals:  ?Vitals Value Taken Time  ?BP 84/72 08/31/21 1259  ?Temp    ?Pulse 71 08/31/21 1300  ?Resp 16 08/31/21 1300  ?SpO2 98 % 08/31/21 1300  ?Vitals shown include unvalidated device data. ? ?Last Pain:  ?Vitals:  ? 08/31/21 0616  ?TempSrc:   ?PainSc: 0-No pain  ?   ? ?  ? ?Complications: No notable events documented. ?

## 2021-08-31 NOTE — Brief Op Note (Addendum)
08/31/2021 ? ?12:40 PM ? ?PATIENT:  Natalie Perry  61 y.o. female ? ?PRE-OPERATIVE DIAGNOSIS:  Left Lower Lobe Lung Nodule ? ?POST-OPERATIVE DIAGNOSIS:  Non-small cell carcinoma left lower lobe, Clinical stage IA (T1,N0) ? ?PROCEDURE:  Procedure(s): ?XI ROBOTIC ASSISTED THORACOSCOPY- LEFT LOWER LOBE LOBECTOMY (Left) ?INTERCOSTAL NERVE BLOCK (Left) ?NODE DISSECTION (Left) ? ?SURGEON:  Surgeon(s) and Role: ?   * [name], MD - Primary ? ?PHYSICIAN ASSISTANT: WAYNE GOLD PA-C, ERIN BARRETT PA-C ? ?ANESTHESIA:   general ? ?EBL:  300 mL  ? ?BLOOD ADMINISTERED:none ? ?DRAINS: (61 F) Blake drain(s) in the LEFT HEMITHORAX   ? ?LOCAL MEDICATIONS USED:  MARCAINE    and OTHER EXPAREL ? ?SPECIMEN:  Source of Specimen:  LEFT LOWER LOBE AND LN SAMPLES ? ?DISPOSITION OF SPECIMEN:  PATHOLOGY ? ?COUNTS:  YES ? ?TOURNIQUET:  * No tourniquets in log * ? ?DICTATION: .Other Dictation: Dictation Number PENDING ? ?PLAN OF CARE: Admit to inpatient  ? ?PATIENT DISPOSITION:  PACU - hemodynamically stable. ?  ?Delay start of Pharmacological VTE agent (>24hrs) due to surgical blood loss or risk of bleeding: yes ? ?COMPLICATIONS: NO KNOWN ? ?

## 2021-08-31 NOTE — Interval H&P Note (Signed)
History and Physical Interval Note: ?PFTs reviewed ? ?08/31/2021 ?7:10 AM ? ?Natalie Perry  has presented today for surgery, with the diagnosis of Left Lower Lobe Lung Nodule.  The various methods of treatment have been discussed with the patient and family. After consideration of risks, benefits and other options for treatment, the patient has consented to  Procedure(s): ?XI ROBOTIC [place] SEGMENTECTOMY (Left) as a surgical intervention.  The patient's history has been reviewed, patient examined, no change in status, stable for surgery.  I have reviewed the patient's chart and labs.  Questions were answered to the patient's satisfaction.   ? ? ?[name] ? ? ?

## 2021-08-31 NOTE — Anesthesia Procedure Notes (Signed)
Arterial Line Insertion ?Start/End4/24/2023 7:05 AM, 08/31/2021 7:10 AM ?Performed by: Lance Coon, CRNA, CRNA ? Preanesthetic checklist: patient identified, IV checked, site marked, risks and benefits discussed, surgical consent, monitors and equipment checked, pre-op evaluation, timeout performed and anesthesia consent ?Lidocaine 1% used for infiltration ?Right, radial was placed ?Catheter size: 20 G ?Hand hygiene performed , maximum sterile barriers used  and Seldinger technique used ? ?Attempts: 1 ?Procedure performed without using ultrasound guided technique. ?Following insertion, dressing applied and Biopatch. ?Post procedure assessment: normal ? ?Patient tolerated the procedure well with no immediate complications. ? ? ?

## 2021-09-01 ENCOUNTER — Encounter (HOSPITAL_COMMUNITY): Payer: Self-pay | Admitting: Thoracic Surgery (Cardiothoracic Vascular Surgery)

## 2021-09-01 ENCOUNTER — Inpatient Hospital Stay (HOSPITAL_COMMUNITY): Payer: PPO

## 2021-09-01 LAB — BASIC METABOLIC PANEL
Anion gap: 3 — ABNORMAL LOW (ref 5–15)
BUN: 9 mg/dL (ref 8–23)
CO2: 24 mmol/L (ref 22–32)
Calcium: 7.9 mg/dL — ABNORMAL LOW (ref 8.9–10.3)
Chloride: 103 mmol/L (ref 98–111)
Creatinine, Ser: 0.69 mg/dL (ref 0.44–1.00)
GFR, Estimated: >60 mL/min (ref >60)
Glucose, Bld: 177 mg/dL — ABNORMAL HIGH (ref 70–99)
Potassium: 4 mmol/L (ref 3.5–5.1)
Sodium: 130 mmol/L — ABNORMAL LOW (ref 135–145)

## 2021-09-01 LAB — SURGICAL PATHOLOGY

## 2021-09-01 MED ORDER — IPRATROPIUM-ALBUTEROL 0.5-2.5 (3) MG/3ML IN SOLN
3.0000 mL | Freq: Two times a day (BID) | RESPIRATORY_TRACT | Status: DC
  Administered 2021-09-01 – 2021-09-03 (×4): 3 mL via RESPIRATORY_TRACT
  Filled 2021-09-01 (×4): qty 3

## 2021-09-01 MED ORDER — PREGABALIN 25 MG PO CAPS
25.0000 mg | ORAL_CAPSULE | Freq: Two times a day (BID) | ORAL | Status: DC
  Administered 2021-09-01 – 2021-09-03 (×6): 25 mg via ORAL
  Filled 2021-09-01 (×6): qty 1

## 2021-09-01 NOTE — Progress Notes (Signed)
?  Mobility Specialist Criteria Algorithm Info. ? ? 09/01/21 1300  ?Mobility  ?Activity Ambulated with assistance in hallway;Transferred from bed to chair (To chair after ambulation)  ?Range of Motion/Exercises Active;All extremities  ?Level of Assistance Standby assist, set-up cues, supervision of patient - no hands on  ?Assistive Device Front wheel walker  ?Distance Ambulated (ft) 80 ft  ?Activity Response Tolerated well  ? ?Patient received in bed eager to participate in mobility. Was independent with bed mobility and stood independently. Ambulated in hallway supervision level with slow steady gait. Returned to room without complaint or incident. Was left in recliner chair with all needs met call bell in reach. ? ?09/01/2021 ?1:00PM ? ?[place] Genieve Ramaswamy, CMS, BS EXP ?Acute Rehabilitation Services  ?BLTGA:[phone] ?Office: [phone] ? ?

## 2021-09-01 NOTE — Plan of Care (Signed)
?  Problem: Education: ?Goal: Knowledge of General Education information will improve ?Description: Including pain rating scale, medication(s)/side effects and non-pharmacologic comfort measures ?Outcome: Progressing ?  ?Problem: Health Behavior/Discharge Planning: ?Goal: Ability to manage health-related needs will improve ?Outcome: Progressing ?  ?Problem: Clinical Measurements: ?Goal: Ability to maintain clinical measurements within normal limits will improve ?Outcome: Progressing ?Goal: Will remain free from infection ?Outcome: Progressing ?  ?Problem: Coping: ?Goal: Level of anxiety will decrease ?Outcome: Progressing ?  ?Problem: Pain Managment: ?Goal: General experience of comfort will improve ?Outcome: Progressing ?  ?Problem: Safety: ?Goal: Ability to remain free from injury will improve ?Outcome: Progressing ?  ?Problem: Skin Integrity: ?Goal: Risk for impaired skin integrity will decrease ?Outcome: Progressing ?  ?

## 2021-09-01 NOTE — Discharge Summary (Addendum)
Physician Discharge Summary  ?Patient ID: ?Natalie Perry ?MRN: [id number] ?DOB/AGE: May 27, 1960 61 y.o. ? ?Admit date: 08/31/2021 ?Discharge date: 09/04/2021 ? ?Admission Diagnoses: Left lower lobe lung nodule ? ?Discharge Diagnoses: Adenocarcinoma of the left lower lobe of the lung, clinical stage Ia (T1, N0), pathologic stage IIb (T2a, N1) ? ?Principal Problem: ?  S/P lobectomy of lung ? ?Patient Active Problem List  ? Diagnosis Date Noted  ? S/P lobectomy of lung 08/31/2021  ? Multiple subsegmental pulmonary emboli without acute cor pulmonale ([place]) 11/18/2020  ? Lung nodule 11/18/2020  ? Abnormal laboratory test 11/18/2020  ? Hypoxia 10/31/2020  ? COPD with acute exacerbation ([place]) 07/30/2020  ? Chronic pain of both knees 07/30/2020  ? Visit for screening mammogram 04/23/2020  ? Polyuria 01/16/2020  ? Need for prophylactic vaccination and inoculation against influenza 01/16/2020  ? Need for prophylactic vaccination against Streptococcus pneumoniae (pneumococcus) 01/16/2020  ? Moderate persistent asthma without complication 42/70/6237  ? Herpes zoster without complication 62/83/1517  ? Vitamin D deficiency 07/02/2019  ? Thrombocytopenia ([place]) 07/05/2016  ? History of hepatitis C 07/05/2016  ? Hypothyroid 10/17/2012  ? GERD (gastroesophageal reflux disease) 10/17/2012  ? Hepatic cirrhosis ([place]) 10/16/2012  ? Chronic hepatitis C virus infection ([place]) 10/16/2012  ? Allergic rhinitis 07/31/2008  ? PULMONARY NODULE 06/21/2007  ? DYSPNEA 06/06/2007  ? COUGH 06/06/2007  ? Benign hypertension 05/25/2007  ? ASTHMA 05/25/2007  ? Asthma 05/25/2007  ?  ?Discharged Condition: good ? ?History of Present Illness: ? ?Natalie Perry is a 61 year old woman with a history of PE, arthritis, asthma, hepatitis C (treated), cirrhosis, bipolar disorder, and remote tobacco use (quit 20 years ago).  She has severe arthritis in both knees and uses a walker.  She had a pulmonary embolus in June 2022.  On the CT angiogram she was found to have  a left lower lobe lung nodule.  A follow-up CT in 6 months showed the nodule had increased in size.  A PET/CT showed the nodule was hypermetabolic with an SUV of 4.3.  He was evaluated by Dr. Roxan Hockey at which time she admitted to physical activity is limited by arthritis.  No shortness of breath.  No chest pain, pressure or tightness with activity.  She has lost some weight due to both diet and exercise (swimming).  It was felt she would require surgical resection.  The risks and benefits of the procedure were explained to the patient and he was agreeable to proceed. ? ?Hospital Course: ? ?Natalie Perry presented to [facility] on 08/31/21.  She was taken to the operating room and underwent Robotic Assisted Video Thoracoscopy with Left Lower Lobectomy, Lymph Node Dissection, and Intercostal Nerve Block.  She tolerated the procedure without difficulty, was extubated, and taken to the PACU in stable condition.  The patient's CXR was free from pneumothorax.  She did not exhibit an air leak and her chest tube was placed on water seal.  Her CXR remained free of pneumothorax.  The patient has a history of Hypertension and her home medications were resumed.  She was on Lovenox for DVT prophylaxis.  The patient's chest tube output was too high to remove.  This improved with chest tube removal on 09/03/2021.  Follow up CXR showed no pneumothorax, small left pleural effusion.  The patient was restarted on her home Eliquis prior to discharge.  The patient is ambulating without difficulty.  Her surgical incisions are healing without evidence of infection.  She is medically stable for discharge home today. ? ? ?  Consults: None ? ?Treatments: surgery:  ? ?Operative Report  ?  ?DATE OF PROCEDURE: 08/31/2021 ?  ?PREOPERATIVE DIAGNOSIS:  Left lower lobe lung nodule. ?  ?POSTOPERATIVE DIAGNOSIS:  Non-small cell carcinoma, left lower lobe, clinical stage IA (T1, N0). ?  ?PROCEDURE:  Xi robotic-assisted left lower lobectomy,  lymph node dissection and intercostal nerve blocks levels 3 through 10. ?  ?SURGEON:  Revonda Standard. Roxan Hockey, MD ?  ?ASSISTANT:  Jadene Pierini and Ellwood Handler, both are PAs. ?  ?ANESTHESIA:  General. ?  ?FINDINGS:  Multiple enlarged but otherwise benign-appearing lymph nodes.  Frozen section revealed non-small cell carcinoma.  Bronchial margin free of tumor.  Bleeding from the basilar pulmonary artery, necessitating lobectomy. ?  ? ?SURGICAL PATHOLOGY : ?CASE: [id number]  ?PATIENT: Natalie Perry  ?Surgical Pathology Report  ? ?Clinical History: left lower lobe lung nodule (cm)  ? ?FINAL MICROSCOPIC DIAGNOSIS:  ? ?A. LYMPH NODE, LEVEL 12, EXCISION:  ?- Lymph node, negative for carcinoma (0/1)  ? ?B. LUNG, LEFT LOWER LOBE, LOBECTOMY:  ?- Invasive moderately differentiated lung adenocarcinoma, 2.1 cm  ?- Carcinoma focally invades into the visceral pleural surface  ?- Resection margins are negative for carcinoma  ?- Seven hilar lymph nodes, negative for carcinoma (0/7)  ?- See oncology table  ? ?C. LYMPH NODE, LEVEL 9, EXCISION:  ?- Lymph node, negative for carcinoma (0/1)  ? ?D. LYMPH NODE, LEVEL 7, EXCISION:  ?- Lymph node, negative for carcinoma (0/1)  ? ?E. LYMPH NODE, 4L #1, EXCISION:  ?- Lymph node, negative for carcinoma (0/1)  ? ?F. LYMPH NODE, 4L #2, EXCISION:  ?- Lymph node, negative for carcinoma (0/1)  ? ?G. LYMPH NODE, LEVEL 5, EXCISION:  ?- Lymph node, negative for carcinoma (0/1)  ? ?H. LYMPH NODE, LEVEL 10, EXCISION:  ?- Metastatic carcinoma to a lymph node (1/1)  ? ?I. LYMPH NODE, LEVEL 11, EXCISION:  ?- Lymph node, negative for carcinoma (0/1)  ? ?J. LYMPH NODE, LEVEL 11 #2, EXCISION:  ?- Lymph node, negative for carcinoma (0/1)  ? ?K. LYMPH NODE, LEVEL 10 #2, EXCISION:  ?- Lymph node, negative for carcinoma (0/1)  ? ?ONCOLOGY TABLE:  ? ?LUNG: Resection  ? ?Synchronous Tumors: Not applicable  ?Total Number of Primary Tumors: 1  ?Procedure: Lobectomy, lung  ?Specimen Laterality: Left  ?Tumor Focality:  Unifocal  ?Tumor Site: Lower lobe  ?Tumor Size: 2.1 cm  ?Histologic Type: Adenocarcinoma, moderately differentiated  ?Visceral Pleura Invasion: Present, focal  ?Direct Invasion of Adjacent Structures: No adjacent structures present  ?Lymphovascular Invasion: Present  ?Margins: All margins negative for invasive carcinoma  ?     Closest Margin(s) to Invasive Carcinoma: Bronchial margin, 5 cm  ?     Margin(s) Involved by Invasive Carcinoma: Not applicable  ? ?     Margin Status for Non-Invasive Tumor: Not applicable  ?Treatment Effect: No known presurgical therapy  ?Regional Lymph Nodes:  ?     Number of Lymph Nodes Involved: 1  ?                          Nodal Sites with Tumor: Level 10  ?     Number of Lymph Nodes Examined: 17  ?                     Nodal Sites Examined: Levels 4L, 5, 7, 9, 10, 11  ?and 12  ?Distant Metastasis:  ?     Distant Site(s) Involved:  Not applicable  ?Pathologic Stage Classification (pTNM, AJCC 8th Edition): pT2a, pN1  ?Ancillary Studies: Can be performed upon request  ?Representative Tumor Block: B4  ?Comment(s): None  ? ?([id number])  ? ?INTRAOPERATIVE DIAGNOSIS:  ?A.  Level 12 node: "Benign."  ?Intraoperative diagnosis rendered by Dr. Saralyn Pilar at 10:11 AM on  ?08/31/2021.  ?B.  Left lower lobe lung: "Adenocarcinoma.  Bronchial margin clear."  ?Intraoperative diagnosis rendered by Dr. Saralyn Pilar at 12:20 PM on  ?08/31/2021.  ? ?Discharge Exam: ?Blood pressure 128/65, pulse 78, temperature 97.8 ?F (36.6 ?C), temperature source Oral, resp. rate 16, height 5\' 3"  (1.6 m), weight 96.2 kg, SpO2 95 %. ? ?General appearance: alert, cooperative, and no distress ?Heart: regular rate and rhythm ?Lungs: mildly diminished left base ?Abdomen: soft, non-tender; bowel sounds normal; no masses,  no organomegaly ?Extremities: extremities normal, atraumatic, no cyanosis or edema ?Wound: clean and dry, there is some significant swelling which has increased from previous exams ? ?Discharge disposition: 01-Home or Self  Care ? ? ?Allergies as of 09/04/2021   ? ?   Reactions  ? Penicillins Hives, Swelling, Rash, Other (See Comments)  ? Nicotine Rash, Other (See Comments)  ? Skin turns red/rash  ? ?  ? ?  ?Medication L

## 2021-09-01 NOTE — Plan of Care (Signed)
?  Problem: Education: ?Goal: Knowledge of General Education information will improve ?Description: Including pain rating scale, medication(s)/side effects and non-pharmacologic comfort measures ?Outcome: Progressing ?  ?Problem: Health Behavior/Discharge Planning: ?Goal: Ability to manage health-related needs will improve ?Outcome: Progressing ?  ?Problem: Clinical Measurements: ?Goal: Ability to maintain clinical measurements within normal limits will improve ?Outcome: Progressing ?Goal: Will remain free from infection ?Outcome: Progressing ?Goal: Diagnostic test results will improve ?Outcome: Progressing ?  ?Problem: Coping: ?Goal: Level of anxiety will decrease ?Outcome: Progressing ?  ?Problem: Pain Managment: ?Goal: General experience of comfort will improve ?Outcome: Progressing ?  ?Problem: Skin Integrity: ?Goal: Risk for impaired skin integrity will decrease ?Outcome: Progressing ?  ?

## 2021-09-01 NOTE — Progress Notes (Addendum)
? ?   ?  [address] ?      [place] [address] ?            [phone]   ? ?  ?1 Day Post-Op Procedure(s) (LRB): ?XI ROBOTIC ASSISTED THORACOSCOPY- LEFT LOWER LOBE LOBECTOMY (Left) ?INTERCOSTAL NERVE BLOCK (Left) ?NODE DISSECTION (Left) ?Subjective: ? ?Patient having some pain this morning.  Denies N/V ? ?Objective: ?Vital signs in last 24 hours: ?Temp:  [97 ?F (36.1 ?C)-98 ?F (36.7 ?C)] 97.8 ?F (36.6 ?C) (04/25 0331) ?Pulse Rate:  [64-77] 75 (04/25 0331) ?Cardiac Rhythm: Normal sinus rhythm (04/25 0700) ?Resp:  [7-21] 13 (04/25 0331) ?BP: (91-118)/(55-80) 113/63 (04/25 0331) ?SpO2:  [94 %-100 %] 98 % (04/25 0331) ?Arterial Line BP: (93-109)/(50-59) 102/52 (04/24 1345) ?Weight:  [96.2 kg] 96.2 kg (04/25 0331) ? ?Intake/Output from previous day: ?04/24 0701 - 04/25 0700 ?In: 2360 [P.O.:360; I.V.:2000] ?Out: 1150 [Urine:520; Blood:300; Chest Tube:330] ? ?General appearance: alert, cooperative, and no distress ?Heart: regular rate and rhythm ?Lungs: clear to auscultation bilaterally ?Abdomen: soft, non-tender; bowel sounds normal; no masses,  no organomegaly ?Extremities: extremities normal, atraumatic, no cyanosis or edema ?Wound: clean and dry ? ?Lab Results: ?No results for input(s): WBC, HGB, HCT, PLT in the last 72 hours. ?BMET:  ?Recent Labs  ?  09/01/21 ?0344  ?NA 130*  ?K 4.0  ?CL 103  ?CO2 24  ?GLUCOSE 177*  ?BUN 9  ?CREATININE 0.69  ?CALCIUM 7.9*  ?  ?PT/INR: No results for input(s): LABPROT, INR in the last 72 hours. ?ABG ?   ?Component Value Date/Time  ? PHART 7.43 08/31/2021 0720  ? HCO3 25.2 08/31/2021 0720  ? O2SAT 98.4 08/31/2021 0720  ? ?CBG (last 3)  ?No results for input(s): GLUCAP in the last 72 hours. ? ?Assessment/Plan: ?S/P Procedure(s) (LRB): ?XI ROBOTIC ASSISTED THORACOSCOPY- LEFT LOWER LOBE LOBECTOMY (Left) ?INTERCOSTAL NERVE BLOCK (Left) ?NODE DISSECTION (Left) ? ?CV- NSR, H/O HTN- on Cozaar, HCTZ, and Norvasc ?Pulm- CXR w/o pneumothorax, no air leak, 330 cc output  yesterday, will place chest tube to water seal today ?GI- patient tolerating oral diet w/o N/V stop IV Fluids ?Renal- creatinine and lytes are okay, can continue Toradol for pain control as ordered ?Lovenox for DVT prophylaxis ?Dispo- patient stable, no air leak will place chest tube on water seal, repeat CXR in AM ? ? LOS: 1 day  ? ?Erin Barrett, PA-C ? ?09/01/2021 ?Patient seen and examined, agree with above ?Ct to water seal ?Will probably be able to restart Eliquis tomorrow ? ?Revonda Standard Roxan Hockey, MD ?[facility] ?[phone] ? ?

## 2021-09-01 NOTE — Op Note (Signed)
NAME: JENAYAH, ANTU ?MEDICAL RECORD NO: [id number] ?ACCOUNT NO: [id number] ?DATE OF BIRTH: 1961-04-30 ?FACILITY: [facility] ?LOCATION: [facility] ?PHYSICIAN: Revonda Standard. Roxan Hockey, MD ? ?Operative Report  ? ?DATE OF PROCEDURE: 08/31/2021 ? ?PREOPERATIVE DIAGNOSIS:  Left lower lobe lung nodule. ? ?POSTOPERATIVE DIAGNOSIS:  Non-small cell carcinoma, left lower lobe, clinical stage IA (T1, N0). ? ?PROCEDURE:   ?Xi robotic-assisted left lower lobectomy,  ?Lymph node dissection and  ?Intercostal nerve blocks levels 3 through 10. ? ?SURGEON:  Revonda Standard. Roxan Hockey, MD ? ?ASSISTANT:  Jadene Pierini, PA-C  ? ?SECOND ASSISTANT: Ellwood Handler, PA-C ? ?ANESTHESIA:  General. ? ?FINDINGS:  Multiple enlarged but otherwise benign-appearing lymph nodes.  Frozen section revealed non-small cell carcinoma.  Bronchial margin free of tumor.  Bleeding from the basilar pulmonary artery, necessitating lobectomy. ? ?CLINICAL NOTE: Mrs. Junco is a 61 year old woman with a history of pulmonary embolus, asthma, hepatitis C, cirrhosis, and remote tobacco abuse.  She was found to have a lung nodule on a CT angiogram done in 10/2020. Followup CT 6 months later showed  ?the nodule had increased in size and on PET/CT, it was hypermetabolic with an SUV of 4.3.  She was advised to undergo surgical resection.  The plan was to perform a superior segmentectomy if possible.  She did have adequate pulmonary function to tolerate ? the procedure. ? ?She accepted the risks and agreed to proceed. ? ?OPERATIVE NOTE: Mrs. Profit was brought to the preoperative holding area on 08/31/2021.  Anesthesia established intravenous access and placed an arterial blood pressure monitoring line.  She was taken to the operating room and anesthetized and intubated ? with a double lumen endotracheal tube.  Intravenous antibiotics were administered.  A Foley catheter was placed.  Sequential compression devices were placed on the calves for DVT prophylaxis.  She was placed in a right  lateral decubitus position.  A  ?Bair Hugger was placed for active warming. ? ?The left chest was prepped and draped in the usual sterile fashion. ? ?A timeout was performed.  A solution containing 20 mL of liposomal bupivacaine, 30 mL of 0.5% bupivacaine and 50 mL of saline was prepared.  This solution was used for local at the incisions as well as for the intercostal nerve blocks.  An incision was made in  ?the eighth interspace in the midaxillary line, an 8 mm port was inserted.  The thoracoscope was advanced into the chest. After confirming intrapleural placement, carbon dioxide was insufflated into the chest per protocol.  Visualization was difficult and  ?manipulation of the endotracheal tube was necessary.  Ultimately, there was isolation of the left lung.  A 12 mm port was placed in the eighth interspace anterior to the camera port, a 12 mm AirSeal port was placed posterolaterally in the tenth  ?interspace and then two additional eighth interspace ports were placed for the robotic arms.  The robot was deployed.  The camera arm was docked, targeting was performed.  The remaining arms were docked.  The robotic instruments were inserted with  ?thoracoscopic visualization.  The initial dissection was slow due to the lung being slow to deflate. The inferior ligament was divided. All lymph nodes that were encountered were taken as separate specimens and sent for permanent pathology with  ?exception of one node that was sent for frozen section and returned benign.  The pleural reflection was divided at the hilum posteriorly and level 7 nodes were removed.  Working further superiorly, a 4 L node and then level 5 nodes were  removed.  The  ?fissure was inspected.  The fissure was incomplete in its midportion, but relatively complete anteriorly and posteriorly. Only small portions of the fissure needed to be stapled, which was done with the robotic stapler using a blue cartridge.  The pulmonary  ?artery was identified.   Level 12 and 11 nodes were removed.  The superior segmental branches were encircled and divided with the robotic stapler.  While dissecting on the superior segmental bronchus, there was bleeding from the basilar pulmonary artery.  ?Pressure was applied to that area for 10 minutes.  After 10 minutes, the pressure was relieved, but there was still major bleeding from that area.  Surgicel was applied and the pressure then was applied for 15 minutes, but again after removal of the  ?pressure, there was significant bleeding from that area.  Bleeding was controlled.  Control was obtained on the basilar pulmonary artery with a vessel loop.  Inspection revealed this would be difficult to suture repair.  Decision was made to proceed with ? lobectomy as she had adequate pulmonary reserve to tolerate the procedure.  The basilar segmental artery then was divided with the robotic stapler.  The inferior pulmonary vein was encircled and divided with a stapler.  Additional nodes were removed  ?from around the bronchus.  The stapler was placed across the base of the bronchus and closed.  A test inflation showed good aeration of the upper lobe.  The stapler was fired, transecting the lower lobe bronchus.  The sponges and vessel loop that had  ?been placed were removed.  There was one sponge that remained, and it was removed prior to final closure.  The robotic instruments were removed.  The robot was undocked.  The anterior incision was lengthened to approximately 3 cm.  A 15 mm endoscopic  ?retrieval bag was placed into the chest.  The lower lobe was placed into the retrieval bag and then removed and sent for frozen section of the nodule and bronchial margin, which returned showing non-small cell carcinoma with no involvement of the margin.   ?The chest was copiously irrigated with warm saline.  There was bleeding from the posterior port, which was controlled with bipolar cautery.  A 28-French Blake drain was placed through the  original port incision and secured with a #1 silk suture.  It was  ?directed to the apex.  After making a final inspection for hemostasis, the remaining incisions were closed in standard fashion.  Dual lung ventilation was resumed and again the final sponge had been removed prior to wound closure.  After the wounds were  ?closed, Dermabond was applied.  The chest tube was placed to a Pleur-Evac on 20 cm of suction.  The patient was placed back in a supine position.  She was then extubated in the operating room and taken to the postanesthetic care unit in good condition.   ?All sponge, needle and instrument counts were correct at the end of the procedure. ? ?Experienced assistance was necessary for this case due to surgical complexity.  Erin Barrett and Jadene Pierini assisted with port placement, instrument exchange, suctioning, specimen retrieval, and wound closure. ? ? ?SHW ?D: 08/31/2021 6:33:41 pm T: 09/01/2021 2:46:00 am  ?JOB: [id number]/ [id number]  ?

## 2021-09-01 NOTE — Hospital Course (Addendum)
History of Present Illness: ? ?Natalie Perry is a 61 year old woman with a history of PE, arthritis, asthma, hepatitis C (treated), cirrhosis, bipolar disorder, and remote tobacco use (quit 20 years ago).  She has severe arthritis in both knees and uses a walker.  She had a pulmonary embolus in June 2022.  On the CT angiogram she was found to have a left lower lobe lung nodule.  A follow-up CT in 6 months showed the nodule had increased in size.  A PET/CT showed the nodule was hypermetabolic with an SUV of 4.3.  He was evaluated by Dr. Roxan Hockey at which time she admitted to physical activity is limited by arthritis.  No shortness of breath.  No chest pain, pressure or tightness with activity.  She has lost some weight due to both diet and exercise (swimming).  It was felt she would require surgical resection.  The risks and benefits of the procedure were explained to the patient and he was agreeable to proceed. ? ?Hospital Course: ? ?Natalie Perry presented to [facility] on 08/31/21.  She was taken to the operating room and underwent Robotic Assisted Video Thoracoscopy with Left Lower Lobectomy, Lymph Node Dissection, and Intercostal Nerve Block.  She tolerated the procedure without difficulty, was extubated, and taken to the PACU in stable condition.  The patient's CXR was free from pneumothorax.  She did not exhibit an air leak and her chest tube was placed on water seal.  Her CXR remained free of pneumothorax.  The patient has a history of Hypertension and her home medications were resumed.  She was on Lovenox for DVT prophylaxis.  The patient's chest tube output was too high to remove.  This improved with chest tube removal on 09/03/2021.  Follow up CXR showed no pneumothorax, small left pleural effusion.  The patient was restarted on her home Eliquis prior to discharge.  The patient is ambulating without difficulty.  Her surgical incisions are healing without evidence of infection.  She is medically  stable for discharge home today. ?

## 2021-09-01 NOTE — Anesthesia Postprocedure Evaluation (Signed)
Anesthesia Post Note ? ?Patient: Natalie Perry ? ?Procedure(s) Performed: XI ROBOTIC ASSISTED THORACOSCOPY- LEFT LOWER LOBE LOBECTOMY (Left: Chest) ?INTERCOSTAL NERVE BLOCK (Left: Chest) ?NODE DISSECTION (Left: Chest) ? ?  ? ?Patient location during evaluation: PACU ?Anesthesia Type: General ?Level of consciousness: awake and alert ?Pain management: pain level controlled ?Vital Signs Assessment: post-procedure vital signs reviewed and stable ?Respiratory status: spontaneous breathing, nonlabored ventilation and respiratory function stable ?Cardiovascular status: blood pressure returned to baseline and stable ?Postop Assessment: no apparent nausea or vomiting ?Anesthetic complications: no ? ? ?No notable events documented. ? ?Last Vitals:  ?Vitals:  ? 09/01/21 0331 09/01/21 0753  ?BP: 113/63   ?Pulse: 75   ?Resp: 13   ?Temp: 36.6 ?C   ?SpO2: 98% 99%  ?  ?Last Pain:  ?Vitals:  ? 09/01/21 0434  ?TempSrc:   ?PainSc: 3   ? ? ?  ?  ?  ?  ?  ?  ? ?Lynda Rainwater ? ? ? ? ?

## 2021-09-02 ENCOUNTER — Inpatient Hospital Stay (HOSPITAL_COMMUNITY): Payer: PPO

## 2021-09-02 LAB — COMPREHENSIVE METABOLIC PANEL
ALT: 21 U/L (ref 0–44)
AST: 33 U/L (ref 15–41)
Albumin: 2.7 g/dL — ABNORMAL LOW (ref 3.5–5.0)
Alkaline Phosphatase: 44 U/L (ref 38–126)
Anion gap: 4 — ABNORMAL LOW (ref 5–15)
BUN: 10 mg/dL (ref 8–23)
CO2: 25 mmol/L (ref 22–32)
Calcium: 8 mg/dL — ABNORMAL LOW (ref 8.9–10.3)
Chloride: 103 mmol/L (ref 98–111)
Creatinine, Ser: 0.69 mg/dL (ref 0.44–1.00)
GFR, Estimated: >60 mL/min (ref >60)
Glucose, Bld: 141 mg/dL — ABNORMAL HIGH (ref 70–99)
Potassium: 3.8 mmol/L (ref 3.5–5.1)
Sodium: 132 mmol/L — ABNORMAL LOW (ref 135–145)
Total Bilirubin: 0.7 mg/dL (ref 0.3–1.2)
Total Protein: 5 g/dL — ABNORMAL LOW (ref 6.5–8.1)

## 2021-09-02 LAB — CBC
HCT: 26.3 % — ABNORMAL LOW (ref 36.0–46.0)
Hemoglobin: 8.8 g/dL — ABNORMAL LOW (ref 12.0–15.0)
MCH: 30.6 pg (ref 26.0–34.0)
MCHC: 33.5 g/dL (ref 30.0–36.0)
MCV: 91.3 fL (ref 80.0–100.0)
Platelets: 77 10*3/uL — ABNORMAL LOW (ref 150–400)
RBC: 2.88 MIL/uL — ABNORMAL LOW (ref 3.87–5.11)
RDW: 14.2 % (ref 11.5–15.5)
WBC: 7.6 10*3/uL (ref 4.0–10.5)
nRBC: 0 % (ref 0.0–0.2)

## 2021-09-02 NOTE — Progress Notes (Signed)
?  Mobility Specialist Criteria Algorithm Info. ? ? 09/02/21 1020  ?Mobility  ?Activity Ambulated with assistance in hallway ?(in chair before and after ambulation)  ?Range of Motion/Exercises Active;All extremities  ?Level of Assistance Standby assist, set-up cues, supervision of patient - no hands on  ?Assistive Device Four wheel walker  ?Distance Ambulated (ft) 240 ft  ?Activity Response Tolerated well  ? ?Patient received in recliner eager to participate. Ambulated in hallway supervision level with slow gait. Distance limited by fatigue and elevated HR peaking at 133 bpm. SpO2 maintained >95% on room air but had mild SOB that resolved with rest. Returned to room without complaint or incident. Was left in recliner with all needs met, call bell in reach. ? ?09/02/2021 ?12:27 PM ? ?Natalie Perry, CMS, BS EXP ?Acute Rehabilitation Services  ?SPQZR:[phone] ?Office: [phone] ? ?

## 2021-09-02 NOTE — Progress Notes (Addendum)
? ?   ?  [address] ?      [place] [address] ?            [phone]   ? ?  ?2 Days Post-Op Procedure(s) (LRB): ?XI ROBOTIC ASSISTED THORACOSCOPY- LEFT LOWER LOBE LOBECTOMY (Left) ?INTERCOSTAL NERVE BLOCK (Left) ?NODE DISSECTION (Left) ? ?Subjective: ? ?Patient with stomach cramps this morning.  She states that gave her something last night and she was able to move her bowels.  She ambulated in the hallway yesterday without difficulty.  Denies shortness of breath, pain controlled ? ?Objective: ?Vital signs in last 24 hours: ?Temp:  [97.6 ?F (36.4 ?C)-98.3 ?F (36.8 ?C)] 98.3 ?F (36.8 ?C) (04/26 0300) ?Pulse Rate:  [89] 89 (04/25 0825) ?Cardiac Rhythm: Normal sinus rhythm;Bundle branch block;Other (Comment) (04/26 0700) ?Resp:  [15-19] 15 (04/26 0300) ?BP: (116-140)/(63-79) 116/74 (04/26 0300) ?SpO2:  [94 %-99 %] 95 % (04/26 0300) ? ?Intake/Output from previous day: ?04/25 0701 - 04/26 0700 ?In: 480 [P.O.:480] ?Out: 9798 [Urine:1700; Chest Tube:540] ? ?General appearance: alert, cooperative, and no distress ?Heart: regular rate and rhythm ?Lungs: clear to auscultation bilaterally and auditory whistle noted, likely from chest tubes ?Abdomen: soft non-tender, hyperactive BS present ?Extremities: extremities normal, atraumatic, no cyanosis or edema ?Wound: clean and dry ? ?Lab Results: ?Recent Labs  ?  09/02/21 ?0111  ?WBC 7.6  ?HGB 8.8*  ?HCT 26.3*  ?PLT 77*  ? ?BMET:  ?Recent Labs  ?  09/01/21 ?0344 09/02/21 ?0111  ?NA 130* 132*  ?K 4.0 3.8  ?CL 103 103  ?CO2 24 25  ?GLUCOSE 177* 141*  ?BUN 9 10  ?CREATININE 0.69 0.69  ?CALCIUM 7.9* 8.0*  ?  ?PT/INR: No results for input(s): LABPROT, INR in the last 72 hours. ?ABG ?   ?Component Value Date/Time  ? PHART 7.43 08/31/2021 0720  ? HCO3 25.2 08/31/2021 0720  ? O2SAT 98.4 08/31/2021 0720  ? ?CBG (last 3)  ?No results for input(s): GLUCAP in the last 72 hours. ? ?Assessment/Plan: ?S/P Procedure(s) (LRB): ?XI ROBOTIC ASSISTED THORACOSCOPY- LEFT LOWER LOBE  LOBECTOMY (Left) ?INTERCOSTAL NERVE BLOCK (Left) ?NODE DISSECTION (Left) ? ?CV- NSR, BP is controlled- continue home Norvasc, Cozaar, HCTZ ?Pulm- no air leak present, 540 cc output present from chest tube, increase in atelectasis on left on CXR... will discuss tube management with Dr. Roxan Hockey, output may be too high to remove ?Renal- creatinine remains normal ?Mild Hyponatremia at 132, but this has improved ?Hypothyroidism on Synthroid ?GI- stomach cramps, has moved bowels, with hyperactive BS, will stop all stool softeners ?Lovenox for DVT prophylaxis ?Dispo- patient stable, no air leak on water seal, output increased from yesterday at 540 cc output will discuss CT management with Dr. Roxan Hockey  ? ? LOS: 2 days  ? ? ?Ellwood Handler, PA-C ?09/02/2021 ? ?Patient seen and examined, agree with above ?Will keep chest tube until drainage decreases ?Path T2,N1 stage IIb- discussed with Mrs. Schliep ?Will wait and restart Eliquis tomorrow ? ?Revonda Standard Roxan Hockey, MD ?[facility] ?[phone] ?

## 2021-09-02 NOTE — TOC Progression Note (Signed)
Transition of Care (TOC) - Progression Note  ? ? ?Patient Details  ?Name: ANNALAYA WILE ?MRN: [id number] ?Date of Birth: 01/14/1961 ? ?Transition of Care (TOC) CM/SW Contact  ?Angelita Ingles, RN ?Phone Number:[phone] ? ?09/02/2021, 9:24 AM ? ?Clinical Narrative:    ? ?Transition of Care (TOC) Screening Note ? ? ?Patient Details  ?Name: PRIM MORACE ?Date of Birth: 03-17-1961 ? ? ?Transition of Care (TOC) CM/SW Contact:    ?Angelita Ingles, RN ?Phone Number:[phone] ? ?09/02/2021, 9:24 AM ? ? ? ?Transition of Care Department [facility]) has reviewed patient and no TOC needs have been identified at this time. We will continue to monitor patient advancement through interdisciplinary progression rounds. If new patient transition needs arise, please place a TOC consult. ? ? ? ? ?  ?  ? ?Expected Discharge Plan and Services ?  ?  ?  ?  ?  ?                ?  ?  ?  ?  ?  ?  ?  ?  ?  ?  ? ? ?Social Determinants of Health (SDOH) Interventions ?  ? ?Readmission Risk Interventions ?   ? View : No data to display.  ?  ?  ?  ? ? ?

## 2021-09-03 ENCOUNTER — Inpatient Hospital Stay (HOSPITAL_COMMUNITY): Payer: PPO

## 2021-09-03 MED ORDER — APIXABAN 5 MG PO TABS
5.0000 mg | ORAL_TABLET | Freq: Two times a day (BID) | ORAL | Status: DC
  Administered 2021-09-03 – 2021-09-04 (×2): 5 mg via ORAL
  Filled 2021-09-03 (×2): qty 1

## 2021-09-03 MED ORDER — IPRATROPIUM-ALBUTEROL 0.5-2.5 (3) MG/3ML IN SOLN: 3.0000 mL | Freq: Four times a day (QID) | RESPIRATORY_TRACT | Status: DC | PRN

## 2021-09-03 MED ORDER — APIXABAN 5 MG PO TABS: 5.0000 mg | ORAL_TABLET | Freq: Two times a day (BID) | ORAL | Status: DC

## 2021-09-03 NOTE — Care Management Important Message (Signed)
Important Message ? ?Patient Details  ?Name: Natalie Perry ?MRN: [id number] ?Date of Birth: 08-06-1960 ? ? ?Medicare Important Message Given:  Yes ? ? ? ? ?Haunani Dickard ?09/03/2021, 3:53 PM ?

## 2021-09-03 NOTE — Plan of Care (Signed)
?  Problem: Education: ?Goal: Knowledge of General Education information will improve ?Description: Including pain rating scale, medication(s)/side effects and non-pharmacologic comfort measures ?Outcome: Progressing ?  ?Problem: Health Behavior/Discharge Planning: ?Goal: Ability to manage health-related needs will improve ?Outcome: Progressing ?  ?Problem: Clinical Measurements: ?Goal: Ability to maintain clinical measurements within normal limits will improve ?Outcome: Progressing ?Goal: Will remain free from infection ?Outcome: Progressing ?Goal: Diagnostic test results will improve ?Outcome: Progressing ?Goal: Respiratory complications will improve ?Outcome: Progressing ?Goal: Cardiovascular complication will be avoided ?Outcome: Progressing ?  ?Problem: Coping: ?Goal: Level of anxiety will decrease ?Outcome: Progressing ?  ?Problem: Pain Managment: ?Goal: General experience of comfort will improve ?Outcome: Progressing ?  ?Problem: Safety: ?Goal: Ability to remain free from injury will improve ?Outcome: Progressing ?  ?Problem: Skin Integrity: ?Goal: Risk for impaired skin integrity will decrease ?Outcome: Progressing ?  ?

## 2021-09-03 NOTE — Progress Notes (Signed)
? ?   ?  [address] ?      [place] [address] ?            [phone]   ? ?  ?3 Days Post-Op Procedure(s) (LRB): ?XI ROBOTIC ASSISTED THORACOSCOPY- LEFT LOWER LOBE LOBECTOMY (Left) ?INTERCOSTAL NERVE BLOCK (Left) ?NODE DISSECTION (Left) ? ?Subjective: ? ?Patient continues to have some soreness.  Overall feels like she is breathing better.  She is nervous about chest tube removal.  Walked well in the hallway yesterday and is requesting walker with seat at discharge. ? ?Objective: ?Vital signs in last 24 hours: ?Temp:  [97.7 ?F (36.5 ?C)-98.6 ?F (37 ?C)] 98.1 ?F (36.7 ?C) (04/27 0300) ?Pulse Rate:  [79-84] 79 (04/27 0300) ?Cardiac Rhythm: Normal sinus rhythm (04/26 1940) ?Resp:  [13-20] 16 (04/27 0300) ?BP: (92-134)/(54-73) 123/61 (04/27 0300) ?SpO2:  [95 %-98 %] 96 % (04/27 0300) ? ?Intake/Output from previous day: ?04/26 0701 - 04/27 0700 ?In: -  ?Out: [place] ? ?General appearance: alert ?Heart: regular rate and rhythm, S1, S2 normal, no murmur, click, rub or gallop ?Lungs: clear to auscultation bilaterally ?Abdomen: soft, non-tender; bowel sounds normal; no masses,  no organomegaly ?Extremities: extremities normal, atraumatic, no cyanosis or edema ?Wound: clean and dry, ecchymosis at incision ? ?Lab Results: ?Recent Labs  ?  09/02/21 ?0111  ?WBC 7.6  ?HGB 8.8*  ?HCT 26.3*  ?PLT 77*  ? ?BMET:  ?Recent Labs  ?  09/01/21 ?0344 09/02/21 ?0111  ?NA 130* 132*  ?K 4.0 3.8  ?CL 103 103  ?CO2 24 25  ?GLUCOSE 177* 141*  ?BUN 9 10  ?CREATININE 0.69 0.69  ?CALCIUM 7.9* 8.0*  ?  ?PT/INR: No results for input(s): LABPROT, INR in the last 72 hours. ?ABG ?   ?Component Value Date/Time  ? PHART 7.43 08/31/2021 0720  ? HCO3 25.2 08/31/2021 0720  ? O2SAT 98.4 08/31/2021 0720  ? ?CBG (last 3)  ?No results for input(s): GLUCAP in the last 72 hours. ? ?Assessment/Plan: ?S/P Procedure(s) (LRB): ?XI ROBOTIC ASSISTED THORACOSCOPY- LEFT LOWER LOBE LOBECTOMY (Left) ?INTERCOSTAL NERVE BLOCK (Left) ?NODE  DISSECTION (Left) ? ?CV- hemodynamically stable in NSR, BP well controlled- continue Norvasc, Cozaar, HCTZ, will restart Eliquis this evening ?Pulm- no air leak, CXR w/o pneumothorax, effusion, atelectasis persists, output 190 cc yesterday, will d/c chest tube today ?Hypothyroidism on synthroid ?GI- issues resolved ?5. Dispo- patient stable, d/c chest tube today, if remains stable will plan for d/c in AM ? ? LOS: 3 days  ? ? ?Ellwood Handler, PA-C ?09/03/2021 ? ? ?

## 2021-09-03 NOTE — Plan of Care (Signed)
?  Problem: Education: ?Goal: Knowledge of General Education information will improve ?Description: Including pain rating scale, medication(s)/side effects and non-pharmacologic comfort measures ?Outcome: Progressing ?  ?Problem: Health Behavior/Discharge Planning: ?Goal: Ability to manage health-related needs will improve ?Outcome: Progressing ?  ?Problem: Clinical Measurements: ?Goal: Ability to maintain clinical measurements within normal limits will improve ?Outcome: Progressing ?Goal: Will remain free from infection ?Outcome: Progressing ?Goal: Diagnostic test results will improve ?Outcome: Progressing ?Goal: Respiratory complications will improve ?Outcome: Progressing ?Goal: Cardiovascular complication will be avoided ?Outcome: Progressing ?  ?Problem: Coping: ?Goal: Level of anxiety will decrease ?Outcome: Progressing ?  ?Problem: Pain Managment: ?Goal: General experience of comfort will improve ?Outcome: Progressing ?  ?Problem: Safety: ?Goal: Ability to remain free from injury will improve ?Outcome: Progressing ?  ?Problem: Skin Integrity: ?Goal: Risk for impaired skin integrity will decrease ?Outcome: Progressing ?  ?Problem: Skin Integrity: ?Goal: Risk for impaired skin integrity will decrease ?Outcome: Progressing ?  ?

## 2021-09-04 ENCOUNTER — Inpatient Hospital Stay (HOSPITAL_COMMUNITY): Payer: PPO

## 2021-09-04 ENCOUNTER — Other Ambulatory Visit (HOSPITAL_COMMUNITY): Payer: Self-pay

## 2021-09-04 LAB — BASIC METABOLIC PANEL
Anion gap: 7 (ref 5–15)
BUN: 10 mg/dL (ref 8–23)
CO2: 25 mmol/L (ref 22–32)
Calcium: 8 mg/dL — ABNORMAL LOW (ref 8.9–10.3)
Chloride: 100 mmol/L (ref 98–111)
Creatinine, Ser: 0.64 mg/dL (ref 0.44–1.00)
GFR, Estimated: >60 mL/min (ref >60)
Glucose, Bld: 131 mg/dL — ABNORMAL HIGH (ref 70–99)
Potassium: 3.1 mmol/L — ABNORMAL LOW (ref 3.5–5.1)
Sodium: 132 mmol/L — ABNORMAL LOW (ref 135–145)

## 2021-09-04 LAB — CBC
HCT: 26.8 % — ABNORMAL LOW (ref 36.0–46.0)
Hemoglobin: 9.1 g/dL — ABNORMAL LOW (ref 12.0–15.0)
MCH: 31.3 pg (ref 26.0–34.0)
MCHC: 34 g/dL (ref 30.0–36.0)
MCV: 92.1 fL (ref 80.0–100.0)
Platelets: 84 10*3/uL — ABNORMAL LOW (ref 150–400)
RBC: 2.91 MIL/uL — ABNORMAL LOW (ref 3.87–5.11)
RDW: 14.5 % (ref 11.5–15.5)
WBC: 5.3 10*3/uL (ref 4.0–10.5)
nRBC: 0 % (ref 0.0–0.2)

## 2021-09-04 MED ORDER — OXYCODONE HCL 5 MG PO TABS: 5.0000 mg | ORAL_TABLET | ORAL | 0 refills | Status: DC | PRN

## 2021-09-04 MED ORDER — PREGABALIN 50 MG PO CAPS
50.0000 mg | ORAL_CAPSULE | Freq: Two times a day (BID) | ORAL | Status: DC
  Administered 2021-09-04: 50 mg via ORAL
  Filled 2021-09-04: qty 1

## 2021-09-04 MED ORDER — PREGABALIN 50 MG PO CAPS
50.0000 mg | ORAL_CAPSULE | Freq: Two times a day (BID) | ORAL | 1 refills | Status: DC
  Filled 2021-09-04: qty 60, 30d supply, fill #0

## 2021-09-04 NOTE — Progress Notes (Signed)
? ?   ?  [address] ?      [place] [address] ?            [phone]   ? ?  ?4 Days Post-Op Procedure(s) (LRB): ?XI ROBOTIC ASSISTED THORACOSCOPY- LEFT LOWER LOBE LOBECTOMY (Left) ?INTERCOSTAL NERVE BLOCK (Left) ?NODE DISSECTION (Left) ? ?Subjective: ? ?Sitting up in chair.  Having some pain along left chest area.  Denies shortness of breath.  Good use of IS ? ?Objective: ?Vital signs in last 24 hours: ?Temp:  [97.7 ?F (36.5 ?C)-98.1 ?F (36.7 ?C)] 98.1 ?F (36.7 ?C) (04/28 0416) ?Pulse Rate:  [82-91] 83 (04/28 0416) ?Cardiac Rhythm: Normal sinus rhythm (04/27 1908) ?Resp:  [15-17] 16 (04/28 0416) ?BP: (124-148)/(58-74) 128/70 (04/28 0416) ?SpO2:  [97 %-98 %] 97 % (04/28 0416) ? ?Intake/Output from previous day: ?04/27 0701 - 04/28 0700 ?In: 55 [P.O.:780] ?Out: 2600 [Urine:2600] ? ?General appearance: alert, cooperative, and no distress ?Heart: regular rate and rhythm ?Lungs: mildly diminished left base ?Abdomen: soft, non-tender; bowel sounds normal; no masses,  no organomegaly ?Extremities: extremities normal, atraumatic, no cyanosis or edema ?Wound: clean and dry, there is some significant swelling which has increased from previous exams ? ?Lab Results: ?Recent Labs  ?  09/02/21 ?0111  ?WBC 7.6  ?HGB 8.8*  ?HCT 26.3*  ?PLT 77*  ? ?BMET:  ?Recent Labs  ?  09/02/21 ?0111  ?NA 132*  ?K 3.8  ?CL 103  ?CO2 25  ?GLUCOSE 141*  ?BUN 10  ?CREATININE 0.69  ?CALCIUM 8.0*  ?  ?PT/INR: No results for input(s): LABPROT, INR in the last 72 hours. ?ABG ?   ?Component Value Date/Time  ? PHART 7.43 08/31/2021 0720  ? HCO3 25.2 08/31/2021 0720  ? O2SAT 98.4 08/31/2021 0720  ? ?CBG (last 3)  ?No results for input(s): GLUCAP in the last 72 hours. ? ?Assessment/Plan: ?S/P Procedure(s) (LRB): ?XI ROBOTIC ASSISTED THORACOSCOPY- LEFT LOWER LOBE LOBECTOMY (Left) ?INTERCOSTAL NERVE BLOCK (Left) ?NODE DISSECTION (Left) ? ?CV- NSR, BP well controlled- on home Norvasc, HCTZ/Cozaar.. Eliquis resumed last night ?Pulm- CT  removed, CXR w/o pneumothorax, small left pleural effusion, + atelectasis ?Hypothyroidism continue synthroid ?Dispo- patient stable, no pneumothorax post chest tube removal, however there is an increase in swelling at one of her incision sites, she denies shortness of breath and remains hemodynamically stable, will discuss further management with Dr. Roxan Hockey ? ? LOS: 4 days  ? ? ?Ellwood Handler, PA-C ?09/04/2021 ? ? ?

## 2021-09-04 NOTE — TOC Transition Note (Addendum)
Transition of Care (TOC) - CM/SW Discharge Note ? ? ?Patient Details  ?Name: SAMREEN SELTZER ?MRN: [id number] ?Date of Birth: [birth year] ? ?Transition of Care (TOC) CM/SW Contact:  ?Angelita Ingles, RN ?Phone [phone] ? ?09/04/2021, 9:10 AM ? ? ?Clinical Narrative:    ?Patient discharging. Rollator has been ordered per [employer] and to be delivered to the bedside. No other needs noted at this time. TOC will sign off.  ? ? ?  ?  ? ? ?Patient Goals and CMS Choice ?  ?  ?  ? ?Discharge Placement ?  ?           ?  ?  ?  ?  ? ?Discharge Plan and Services ?  ?  ?           ?  ?  ?  ?  ?  ?  ?  ?  ?  ?  ? ?Social Determinants of Health (SDOH) Interventions ?  ? ? ?Readmission Risk Interventions ?   ? View : No data to display.  ?  ?  ?  ? ? ? ? ? ?

## 2021-09-08 ENCOUNTER — Telehealth: Payer: Self-pay

## 2021-09-08 NOTE — Telephone Encounter (Signed)
Patient contacted the office inquiring about her rolling walker with bench seat. She was to get one before she was discharged from the hospital but left before it was delivered. Spoke with representative with [facility] who states that a new walker will be delivered to her home. Patient aware and acknowledged receipt.  ?

## 2021-09-09 ENCOUNTER — Telehealth: Payer: Self-pay

## 2021-09-09 NOTE — Telephone Encounter (Signed)
Go ahead and give verbal orders - if needs written orders or forms have them send to our office ?

## 2021-09-09 NOTE — Telephone Encounter (Signed)
Patient called stating that she was told she needed a toilet seat riser and to contact us. [facility] could [facility] one since her surgery. Can you send that or does she need to contact someone else? She gave me the name of that place. [place] [phone]. ?

## 2021-09-10 ENCOUNTER — Other Ambulatory Visit: Payer: Self-pay | Admitting: *Deleted

## 2021-09-10 NOTE — Telephone Encounter (Signed)
This was faxed on 5/3.  ?Natalie Perry, [place] 09/10/21 9:02 AM ? ?

## 2021-09-10 NOTE — Progress Notes (Signed)
The proposed treatment discussed in conference is for discussion purpose only and is not a binding recommendation. The patient was not physically examined, or presented with their treatment options. Therefore, final treatment plans cannot be decided.  ?

## 2021-09-15 ENCOUNTER — Encounter: Payer: Self-pay | Admitting: Thoracic Surgery (Cardiothoracic Vascular Surgery)

## 2021-09-15 ENCOUNTER — Ambulatory Visit
Admission: RE | Admit: 2021-09-15 | Discharge: 2021-09-15 | Disposition: A | Discharge status: Home / Self Care | Payer: PPO | Source: Ambulatory Visit | Attending: Thoracic Surgery (Cardiothoracic Vascular Surgery) | Admitting: Thoracic Surgery (Cardiothoracic Vascular Surgery)

## 2021-09-15 ENCOUNTER — Other Ambulatory Visit: Payer: Self-pay | Admitting: *Deleted

## 2021-09-15 ENCOUNTER — Ambulatory Visit (INDEPENDENT_AMBULATORY_CARE_PROVIDER_SITE_OTHER): Payer: Self-pay | Admitting: Thoracic Surgery (Cardiothoracic Vascular Surgery)

## 2021-09-15 ENCOUNTER — Encounter (HOSPITAL_COMMUNITY): Payer: Self-pay | Admitting: Internal Medicine

## 2021-09-15 VITALS — BP 131/82 | HR 115 | Temp 98.1°F | Resp 20 | Ht 63.0 in | Wt 194.0 lb

## 2021-09-15 DIAGNOSIS — M25562 Pain in left knee: Secondary | ICD-10-CM

## 2021-09-15 DIAGNOSIS — G8929 Other chronic pain: Secondary | ICD-10-CM

## 2021-09-15 DIAGNOSIS — Z902 Acquired absence of lung [part of]: Secondary | ICD-10-CM

## 2021-09-15 DIAGNOSIS — J90 Pleural effusion, not elsewhere classified: Secondary | ICD-10-CM | POA: Diagnosis not present

## 2021-09-15 DIAGNOSIS — M25561 Pain in right knee: Secondary | ICD-10-CM

## 2021-09-15 DIAGNOSIS — J9811 Atelectasis: Secondary | ICD-10-CM | POA: Diagnosis not present

## 2021-09-15 MED ORDER — PREDNISONE 10 MG (21) PO TBPK: ORAL_TABLET | ORAL | 0 refills | Status: AC

## 2021-09-15 MED ORDER — OXYCODONE HCL 5 MG PO TABS: 5.0000 mg | ORAL_TABLET | Freq: Three times a day (TID) | ORAL | 0 refills | Status: DC | PRN

## 2021-09-15 NOTE — Progress Notes (Signed)
? ?   ?[address] ?      [place] [address] ?            [phone]   ? ? ?HPI: Natalie Perry returns today for a scheduled follow-up visit ? ?Natalie Perry is a 61 year old woman with a history of pulmonary embolus, arthritis, asthma, hepatitis C, cirrhosis, bipolar disorder, and remote tobacco abuse.  She had a pulmonary embolus in June 2022.  On her CT angiogram she had a left lower lobe lung nodule.  A follow-up CT in 6 months showed the nodule increased in size and it was hypermetabolic on PET. ? ?I did a robotic assisted left lower lobectomy on 08/31/2021.  The nodule turned out to be an adenocarcinoma.  1 of 17 nodes was positive.  Final stage was 2B (T2 a, N1).  Her postoperative course was unremarkable and she went home on day 4. ? ?She continues to have some pain.  She is not having any respiratory issues.  She had noticed some swelling under one of the incisions prior to discharge and that is still present.  She is taking Lyrica twice daily and using oxycodone about twice a day as well. ? ?Past Medical History:  ?Diagnosis Date  ? Anxiety   ? Arthritis   ? Asthma   ? Bipolar affective disorder ([place])   ? Cirrhosis of liver due to hepatitis C   ? Hepatitis   ? Hypertension   ? Hypothyroidism   ? Pulmonary embolus ([place]) 10/2020  ? Thyroid disease   ? ? ?Current Outpatient Medications  ?Medication Sig Dispense Refill  ? acetaminophen (TYLENOL) 500 MG tablet Take 500 mg by mouth every 6 (six) hours as needed (pain.).    ? albuterol (PROVENTIL) (2.5 MG/3ML) 0.083% nebulizer solution INHALE 1 VIAL VIA NEBULIZER 3 TO 4 TIMES A DAY AS DIRECTED 300 mL 3  ? albuterol (VENTOLIN HFA) 108 (90 Base) MCG/ACT inhaler INHALE 1-2 PUFFS into THE lungs EVERY 4 TO 6 HOURS AS NEEDED 8.5 g 5  ? amLODipine (NORVASC) 5 MG tablet TAKE 1 TABLET BY MOUTH EVERY DAY (Patient taking differently: Take 5 mg by mouth every evening.) 30 tablet 2  ? betamethasone dipropionate (DIPROLENE) 0.05 % ointment Apply 1  application. topically daily as needed (skin irritation.).    ? ELIQUIS 5 MG TABS tablet TAKE 1 TABLET(5 MG) BY MOUTH TWICE DAILY 60 tablet 0  ? levothyroxine (SYNTHROID) 100 MCG tablet TAKE ONE TABLET BY MOUTH EVERY MORNING 90 tablet 0  ? LORazepam (ATIVAN) 1 MG tablet TAKE 1 TABLET(1 MG) BY MOUTH THREE TIMES DAILY AS NEEDED FOR ANXIETY 90 tablet 0  ? losartan-hydrochlorothiazide (HYZAAR) 100-25 MG tablet Take 1 tablet by mouth daily. 90 tablet 0  ? Polyethyl Glycol-Propyl Glycol (LUBRICANT EYE DROPS) 0.4-0.3 % SOLN Place 1-2 drops into both eyes 3 (three) times daily as needed (dry/irritated eyes.).    ? potassium chloride SA (KLOR-CON M) 20 MEQ tablet Take 2 tablets (40 mEq total) by mouth 2 (two) times daily. 360 tablet 0  ? predniSONE (STERAPRED UNI-PAK 21 TAB) 10 MG (21) TBPK tablet Take 6 tablets (60 mg total) by mouth daily for 1 day, THEN 5 tablets (50 mg total) daily for 1 day, THEN 4 tablets (40 mg total) daily for 1 day, THEN 3 tablets (30 mg total) daily for 1 day, THEN 2 tablets (20 mg total) daily for 1 day, THEN 1 tablet (10 mg total) daily for 1 day. 21 tablet 0  ?  pregabalin (LYRICA) 50 MG capsule Take 1 capsule (50 mg total) by mouth 2 (two) times daily. 60 capsule 1  ? promethazine (PHENERGAN) 25 MG tablet Take 1 tablet (25 mg total) by mouth every 8 (eight) hours as needed for nausea or vomiting. 30 tablet 2  ? SYMBICORT 160-4.5 MCG/ACT inhaler Inhale 2 puffs into the lungs 2 (two) times daily. (Patient taking differently: Inhale 1 puff into the lungs 2 (two) times daily.) 1 each 12  ? Vitamin D, Ergocalciferol, (DRISDOL) 1.25 MG (50000 UNIT) CAPS capsule TAKE 1 CAPSULE BY MOUTH EVERY 7 DAYS (Patient taking differently: Take 50,000 Units by mouth every Wednesday.) 12 capsule 1  ? oxyCODONE (OXY IR/ROXICODONE) 5 MG immediate release tablet Take 1 tablet (5 mg total) by mouth 3 (three) times daily as needed. 30 tablet 0  ? ?No current facility-administered medications for this visit.   ? ? ?Physical Exam ?BP 131/82 (BP Location: Left Arm, Patient Position: Sitting)   Pulse (!) 115   Temp 98.1 ?F (36.7 ?C)   Resp 20   Ht 5\' 3"  (1.6 m)   Wt 194 lb (88 kg)   SpO2 94% Comment: RA  BMI 34.37 kg/m?  ?Alert and oriented x3 with no focal deficits ?Lungs diminished at left base, otherwise clear ?Incisions intact, seroma posterior lateral with minimal erythema posterior lateral incision ?Cardiac regular rate and rhythm with occasional early beat ?No peripheral edema ? ?Diagnostic Tests: ?Rhythm strip showed sinus rhythm at about 100 bpm ? ?CHEST - 2 VIEW ?  ?COMPARISON:  09/04/2021 ?  ?FINDINGS: ?Normal heart size, mediastinal contours, and pulmonary vascularity. ?  ?Persistent LEFT pleural effusion and basilar atelectasis. ?  ?Remaining lungs clear. ?  ?No definite acute infiltrate or pneumothorax. ?  ?Osseous structures unremarkable. ?  ?IMPRESSION: ?Persistent LEFT pleural effusion and basilar atelectasis. ?  ?  ?Electronically Signed ?  By: Lavonia Dana M.D. ?  On: 09/15/2021 10:04 ?I personally reviewed the chest x-ray images.  No change from predischarge film with moderate left pleural effusion. ? ?Impression: ?Natalie Perry is a 61 year old woman was originally found to have a lung nodule when she presented with a pulmonary embolus back in June 2022.  On follow-up the nodule increased in size and it was hypermetabolic on PET/CT.  I did a robotic left lower lobectomy and node dissection on 09/01/2021.  The nodule turned out to be a T2a, N1, stage IIb adenocarcinoma. ? ?Status post left lower lobectomy-still has some pain.  She is using Lyrica twice daily.  Also using oxycodone about twice a day currently.  She needed a refill on her oxycodone so I placed a prescription for that.  She does have a refill to use on the Lyrica.  I do not think she should drive until she is no longer taking the oxycodone.  Her activities are limited due to arthritis.  She likes to do water exercises and swimming.  I  advised her to wait until at least 6 weeks postop before doing that. ? ?She has a seroma posterior laterally on the left and some mild erythema.  I will think there is any definite infection.  They can put some Neosporin ointment on the incision if that like.  If that redness worsens they should contact us.  The seroma does not need any intervention at this point. ? ?There is a moderate left effusion.  It is likely is a combination of inflammatory fluid and possibly just some fluid filling the space with the upper  lobe not completely filling the chest.  We will try prednisone taper to see if that has any effect. ? ?She is on Eliquis due to her history of PE. ? ?She has stage IIb disease with an occult N1 metastasis.  She needs to follow-up with Dr. Lavera Guise in [place] to discuss adjuvant chemotherapy. ? ?Plan: ?Prednisone taper ?Refill oxycodone with 20 tablets, 5 mg p.o. 3 times daily as needed for pain ? ? ?[name], MD ?[facility] ?[phone] ? ? ? ?

## 2021-09-17 ENCOUNTER — Encounter (HOSPITAL_COMMUNITY): Payer: Self-pay | Admitting: Internal Medicine

## 2021-09-17 DIAGNOSIS — C3491 Malignant neoplasm of unspecified part of right bronchus or lung: Secondary | ICD-10-CM | POA: Diagnosis not present

## 2021-09-19 ENCOUNTER — Other Ambulatory Visit: Payer: Self-pay | Admitting: Physician Assistant

## 2021-09-21 DIAGNOSIS — Z1211 Encounter for screening for malignant neoplasm of colon: Secondary | ICD-10-CM | POA: Diagnosis not present

## 2021-09-23 ENCOUNTER — Telehealth: Payer: Self-pay | Admitting: *Deleted

## 2021-09-23 NOTE — Telephone Encounter (Signed)
Natalie Perry contacted the answering service last night regarding a muscle spasm. Patient stated her concerns were addressed by the on call physician. Patient states discomfort has decreased since applying the recommended heat to her back. No other concerns this time.  ?

## 2021-09-24 ENCOUNTER — Telehealth: Payer: Self-pay

## 2021-09-24 NOTE — Chronic Care Management (AMB) (Signed)
Chronic Care Management Pharmacy Assistant   Name: Natalie Perry  MRN: [id number] DOB: 02-06-1961  Reason for Encounter: Disease State/ Hypertension   Recent office visits:  08-26-2021 [name], PA-C. Platelets= 100. Chloride= 108. EKG completed.  07-22-2021 [name], LPN. Medicare annual visit. Cologaurd orders placed. Recent consult visits:  09-15-2021 [name], MD (Cardiothoracic surgery). START prednisone pack. CHANGE oxycodone 5 mg every 4 hours TO 5 mg 3 times daily.  08-31-2021 [name], MD (Cardiothoracic surgery). XI ROBOTIC ASSISTED THORACOSCOPY- LEFT LOWER LOBE LOBECTOMY procedure completed. START lyrica 50 mg twice daily.  08-28-2021 [name], MD (Cardiothoracic surgery). Pre-op testing.  08-17-2021 [name], MD (Cardiothoracic surgery). STOP celexa and doxycycline.  08-07-2021 [name], MD (Oncology). No changes.  08-03-2021 [name], MD (Pulmonology). Unable to view encounter.  07-23-2021 Colonoscopy completed.  07-13-2021 [name], MD (Pulmonology). Unable to view encounter.  Hospital visits:  None in previous 6 months  Medications: Outpatient Encounter Medications as of 09/24/2021  Medication Sig   acetaminophen (TYLENOL) 500 MG tablet Take 500 mg by mouth every 6 (six) hours as needed (pain.).   albuterol (PROVENTIL) (2.5 MG/3ML) 0.083% nebulizer solution INHALE 1 VIAL VIA NEBULIZER 3 TO 4 TIMES A DAY AS DIRECTED   albuterol (VENTOLIN HFA) 108 (90 Base) MCG/ACT inhaler INHALE 1-2 PUFFS into THE lungs EVERY 4 TO 6 HOURS AS NEEDED   amLODipine (NORVASC) 5 MG tablet TAKE 1 TABLET BY MOUTH EVERY DAY (Patient taking differently: Take 5 mg by mouth every evening.)   betamethasone dipropionate (DIPROLENE) 0.05 % ointment Apply 1 application. topically daily as needed (skin irritation.).   ELIQUIS 5 MG TABS tablet TAKE 1 TABLET(5 MG) BY MOUTH TWICE DAILY   levothyroxine  (SYNTHROID) 100 MCG tablet TAKE ONE TABLET BY MOUTH EVERY MORNING   LORazepam (ATIVAN) 1 MG tablet TAKE 1 TABLET(1 MG) BY MOUTH THREE TIMES DAILY AS NEEDED FOR ANXIETY   losartan-hydrochlorothiazide (HYZAAR) 100-25 MG tablet Take 1 tablet by mouth daily.   oxyCODONE (OXY IR/ROXICODONE) 5 MG immediate release tablet Take 1 tablet (5 mg total) by mouth 3 (three) times daily as needed.   Polyethyl Glycol-Propyl Glycol (LUBRICANT EYE DROPS) 0.4-0.3 % SOLN Place 1-2 drops into both eyes 3 (three) times daily as needed (dry/irritated eyes.).   potassium chloride SA (KLOR-CON M) 20 MEQ tablet Take 2 tablets (40 mEq total) by mouth 2 (two) times daily.   pregabalin (LYRICA) 50 MG capsule Take 1 capsule (50 mg total) by mouth 2 (two) times daily.   promethazine (PHENERGAN) 25 MG tablet Take 1 tablet (25 mg total) by mouth every 8 (eight) hours as needed for nausea or vomiting.   SYMBICORT 160-4.5 MCG/ACT inhaler Inhale 2 puffs into the lungs 2 (two) times daily. (Patient taking differently: Inhale 1 puff into the lungs 2 (two) times daily.)   Vitamin D, Ergocalciferol, (DRISDOL) 1.25 MG (50000 UNIT) CAPS capsule TAKE 1 CAPSULE BY MOUTH EVERY 7 DAYS (Patient taking differently: Take 50,000 Units by mouth every Wednesday.)   No facility-administered encounter medications on file as of 09/24/2021.   Recent Office Vitals: BP Readings from Last 3 Encounters:  09/15/21 131/82  09/04/21 128/65  08/28/21 138/77   Pulse Readings from Last 3 Encounters:  09/15/21 (!) 115  09/04/21 78  08/28/21 76    Wt Readings from Last 3 Encounters:  09/15/21 194 lb (88 kg)  09/01/21 212 lb 1.3 oz (96.2 kg)  08/28/21 197 lb 9.6 oz (89.6  kg)     Kidney Function Lab Results  Component Value Date/Time   CREATININE 0.64 09/04/2021 07:43 AM   CREATININE 0.69 09/02/2021 01:11 AM   CREATININE 0.65 12/13/2016 01:06 PM   CREATININE 0.72 07/05/2016 12:42 PM   GFRNONAA >60 09/04/2021 07:43 AM   GFRAA 105 04/23/2020 10:30 AM        Latest Ref Rng & Units 09/04/2021    7:43 AM 09/02/2021    1:11 AM 09/01/2021    3:44 AM  BMP  Glucose 70 - 99 mg/dL 131   141   177    BUN 8 - 23 mg/dL 10   10   9     Creatinine 0.44 - 1.00 mg/dL 0.64   0.69   0.69    Sodium 135 - 145 mmol/L 132   132   130    Potassium 3.5 - 5.1 mmol/L 3.1   3.8   4.0    Chloride 98 - 111 mmol/L 100   103   103    CO2 22 - 32 mmol/L 25   25   24     Calcium 8.9 - 10.3 mg/dL 8.0   8.0   7.9       Current antihypertensive regimen:  Amlodipine 5 mg daily Hyzaar 100-25 mg daily  Patient verbally confirms she is taking the above medications as directed. Yes  How often are you checking your Blood Pressure? twice daily  she checks her blood pressure in the morning before taking her medication.  Current home BP readings: 120/77 72, 122/70 68 Wrist or arm cuff: Wrist Caffeine intake: Limited Salt intake: Limited OTC medications including pseudoephedrine or NSAIDs? Iron and vitamin D  Any readings above 180/120? No  What recent interventions/DTPs have been made by any provider to improve Blood Pressure control since last CPP Visit: None  Any recent hospitalizations or ED visits since last visit with CPP? No  What diet changes have been made to improve Blood Pressure Control?  Patient states she limits her salt intake and drinks plenty of water.  What exercise is being done to improve your Blood Pressure Control?  Patient states she walks daily. Patient recently had lung surgery and is unable to lift anything.  Adherence Review: Is the patient currently on ACE/ARB medication? Yes Does the patient have >5 day gap between last estimated fill dates? CPP to review  Care Gaps: Last annual wellness visit? 07-22-2021   Star Rating Drugs: Hyzaar 100-25 mg- Last filled 07-16-2021 90 DS  [facility] Clinical Pharmacist Assistant [phone]

## 2021-09-25 ENCOUNTER — Other Ambulatory Visit: Payer: Self-pay | Admitting: Surgical

## 2021-09-25 DIAGNOSIS — J45998 Other asthma: Secondary | ICD-10-CM | POA: Diagnosis not present

## 2021-09-25 MED ORDER — PREGABALIN 50 MG PO CAPS: 50.0000 mg | ORAL_CAPSULE | Freq: Two times a day (BID) | ORAL | 1 refills | Status: DC

## 2021-09-25 NOTE — Progress Notes (Signed)
Sent RX for Lyrica to patients pharmacy 50 mg BID #60 , 1 refill  John Giovanni, PA-C

## 2021-09-27 NOTE — Progress Notes (Signed)
[facility]  [address] [place],  [place]  [address] [phone]  Clinic Day:  09/28/2021  Referring physician: [name], PA-C  HISTORY OF PRESENT ILLNESS:  The patient is a 61 y.o. female who I was asked to see for newly diagnosed lung cancer.  A left lower lobectomy in April 2023 showed a stage IIB adenocarcinoma.  The mass measured 2.1 cm with 1/17 nodes positive for disease.  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  Although she has a smoking history, the patient has not smoked in numerous years.  She is still moderately weak from her recent surgery.  The only issue she has noticed since her surgery is redness and fluctuance at one of her VATS incision sites.    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  Labs dating back to 2011 have shown her platelet count consistently around 100K.  As she has not been severely thrombocytopenic, her platelets have been followed conservatively.    PHYSICAL EXAM:  Blood pressure 128/70, pulse 98, temperature 98.6 F (37 C), resp. rate 16, height 5' 3" (1.6 m), weight 187 lb 9.6 oz (85.1 kg), SpO2 96 %. Wt Readings from Last 3 Encounters:  09/28/21 187 lb 9.6 oz (85.1 kg)  09/15/21 194 lb (88 kg)  09/01/21 212 lb 1.3 oz (96.2 kg)   Body mass index is 33.23 kg/m. Performance status (ECOG): 1 - Symptomatic but completely ambulatory Physical Exam Constitutional:      Appearance: Normal appearance. She is not ill-appearing.  HENT:     Mouth/Throat:     Mouth: Mucous membranes are moist.     Pharynx: Oropharynx is clear. No oropharyngeal exudate or posterior oropharyngeal erythema.  Cardiovascular:     Rate and Rhythm: Normal rate and regular rhythm.     Heart sounds: No murmur heard.   No friction rub. No gallop.  Pulmonary:     Effort: Pulmonary effort is normal. No  respiratory distress.     Breath sounds: Normal breath sounds. No wheezing, rhonchi or rales.  Abdominal:     General: Bowel sounds are normal. There is no distension.     Palpations: Abdomen is soft. There is no mass.     Tenderness: There is no abdominal tenderness.  Musculoskeletal:        General: No swelling.     Right lower leg: No edema.     Left lower leg: No edema.  Lymphadenopathy:     Cervical: No cervical adenopathy.     Upper Body:     Right upper body: No supraclavicular or axillary adenopathy.     Left upper body: No supraclavicular or axillary adenopathy.     Lower Body: No right inguinal adenopathy. No left inguinal adenopathy.  Skin:    General: Skin is warm.     Coloration: Skin is not jaundiced.     Findings: Erythema present. No lesion or rash.     Comments: Fluctuance palpated at the surgical site.    Neurological:     General: No focal deficit present.     Mental Status: She is alert and oriented to person, place, and time. Mental status is at baseline.  Psychiatric:        Mood and Affect: Mood normal.        Behavior: Behavior normal.  Thought Content: Thought content normal.   PATHOLOGY: FINAL MICROSCOPIC DIAGNOSIS:   A. LYMPH NODE, LEVEL 12, EXCISION:  - Lymph node, negative for carcinoma (0/1)   B. LUNG, LEFT LOWER LOBE, LOBECTOMY:  - Invasive moderately differentiated lung adenocarcinoma, 2.1 cm  - Carcinoma focally invades into the visceral pleural surface  - Resection margins are negative for carcinoma  - Seven hilar lymph nodes, negative for carcinoma (0/7)  - See oncology table   C. LYMPH NODE, LEVEL 9, EXCISION:  - Lymph node, negative for carcinoma (0/1)   D. LYMPH NODE, LEVEL 7, EXCISION:  - Lymph node, negative for carcinoma (0/1)   E. LYMPH NODE, 4L #1, EXCISION:  - Lymph node, negative for carcinoma (0/1)   F. LYMPH NODE, 4L #2, EXCISION:  - Lymph node, negative for carcinoma (0/1)   G. LYMPH NODE, LEVEL 5, EXCISION:   - Lymph node, negative for carcinoma (0/1)   H. LYMPH NODE, LEVEL 10, EXCISION:  - Metastatic carcinoma to a lymph node (1/1)   I. LYMPH NODE, LEVEL 11, EXCISION:  - Lymph node, negative for carcinoma (0/1)   J. LYMPH NODE, LEVEL 11 #2, EXCISION:  - Lymph node, negative for carcinoma (0/1)   K. LYMPH NODE, LEVEL 10 #2, EXCISION:  - Lymph node, negative for carcinoma (0/1)   ONCOLOGY TABLE:   LUNG: Resection   Synchronous Tumors: Not applicable  Total Number of Primary Tumors: 1  Procedure: Lobectomy, lung  Specimen Laterality: Left  Tumor Focality: Unifocal  Tumor Site: Lower lobe  Tumor Size: 2.1 cm  Histologic Type: Adenocarcinoma, moderately differentiated  Visceral Pleura Invasion: Present, focal  Direct Invasion of Adjacent Structures: No adjacent structures present  Lymphovascular Invasion: Present  Margins: All margins negative for invasive carcinoma       Closest Margin(s) to Invasive Carcinoma: Bronchial margin, 5 cm       Margin(s) Involved by Invasive Carcinoma: Not applicable        Margin Status for Non-Invasive Tumor: Not applicable  Treatment Effect: No known presurgical therapy  Regional Lymph Nodes:       Number of Lymph Nodes Involved: 1                            Nodal Sites with Tumor: Level 10       Number of Lymph Nodes Examined: 17                       Nodal Sites Examined: Levels 4L, 5, 7, 9, 10, 11  and 12  Distant Metastasis:       Distant Site(s) Involved: Not applicable  Pathologic Stage Classification (pTNM, AJCC 8th Edition): pT2a, pN1  Ancillary Studies: Can be performed upon request  Representative Tumor Block: B4  Comment(s): None   LABS:      Latest Ref Rng & Units 09/28/2021   12:00 AM 09/04/2021    7:43 AM 09/02/2021    1:11 AM  CBC  WBC  13.6      5.3   7.6    Hemoglobin 12.0 - 16.0 11.3      9.1   8.8    Hematocrit 36 - 46 36      26.8   26.3    Platelets 150 - 400 K/uL 140      84   77       This result is from an  external source.  Latest Ref Rng & Units 09/28/2021   12:00 AM 09/04/2021    7:43 AM 09/02/2021    1:11 AM  CMP  Glucose 70 - 99 mg/dL  131   141    BUN 4 - 21 11      10   10    Creatinine 0.5 - 1.1 0.5      0.64   0.69    Sodium 137 - 147 136      132   132    Potassium 3.5 - 5.1 mEq/L 3.4      3.1   3.8    Chloride 99 - 108 101      100   103    CO2 13 - 22 24      25   25    Calcium 8.7 - 10.7 8.7      8.0   8.0    Total Protein 6.5 - 8.1 g/dL   5.0    Total Bilirubin 0.3 - 1.2 mg/dL   0.7    Alkaline Phos 25 - 125 71       44    AST 13 - 35 36       33    ALT 7 - 35 U/L 26       21       This result is from an external source.   ASSESSMENT & PLAN:  Assessment/Plan:  A 60 y.o. female with stage IIB (T1N1 M0) lung adenocarcinoma, status post a left lower lobe lobectomy in April 2023. Due to her nodal positivity, the patient understands adjuvant therapy is necessary.  However, it does appear she needs a few more weeks to physically recuperate from surgery.  Per her physical exam today, I  appreciated redness and fluctuance at her surgical site.  Based upon this, I will prescribe her Levaquin 750 mg for 10 days to eradicate any possible skin infection related to her recent surgery.  As she has a KRAS G12C mutation, I will see if there are any studies in the adjuvant setting using KRAS agents.  If not, platinum-based chemotherapy would be used to prevent future disease recurrence.  I will see her back in 2 weeks to reassess her before she heads into potential adjuvant treatment. The patient understands all the plans discussed today and is in agreement with them.     Dequincy A Lewis, MD       

## 2021-09-28 ENCOUNTER — Inpatient Hospital Stay: Payer: PPO

## 2021-09-28 ENCOUNTER — Inpatient Hospital Stay: Payer: PPO | Attending: Oncology | Admitting: Oncology

## 2021-09-28 ENCOUNTER — Other Ambulatory Visit: Payer: Self-pay | Admitting: Oncology

## 2021-09-28 DIAGNOSIS — C3432 Malignant neoplasm of lower lobe, left bronchus or lung: Secondary | ICD-10-CM | POA: Diagnosis not present

## 2021-09-28 DIAGNOSIS — D649 Anemia, unspecified: Secondary | ICD-10-CM | POA: Diagnosis not present

## 2021-09-28 DIAGNOSIS — D696 Thrombocytopenia, unspecified: Secondary | ICD-10-CM

## 2021-09-28 LAB — CBC: RBC: 4.11 (ref 3.87–5.11)

## 2021-09-28 LAB — COMPREHENSIVE METABOLIC PANEL
Albumin: 3.9 (ref 3.5–5.0)
Calcium: 8.7 (ref 8.7–10.7)

## 2021-09-28 LAB — CBC AND DIFFERENTIAL
HCT: 36 (ref 36–46)
Hemoglobin: 11.3 — AB (ref 12.0–16.0)
Neutrophils Absolute: 11.42
Platelets: 140 10*3/uL — AB (ref 150–400)
WBC: 13.6

## 2021-09-28 LAB — HEPATIC FUNCTION PANEL
ALT: 26 U/L (ref 7–35)
AST: 36 — AB (ref 13–35)
Alkaline Phosphatase: 71 (ref 25–125)
Bilirubin, Total: 1.4

## 2021-09-28 LAB — BASIC METABOLIC PANEL
BUN: 11 (ref 4–21)
CO2: 24 — AB (ref 13–22)
Chloride: 101 (ref 99–108)
Creatinine: 0.5 (ref 0.5–1.1)
Glucose: 147
Potassium: 3.4 mEq/L — AB (ref 3.5–5.1)
Sodium: 136 — AB (ref 137–147)

## 2021-09-28 MED ORDER — LEVOFLOXACIN 750 MG PO TABS: 750.0000 mg | ORAL_TABLET | Freq: Every day | ORAL | 0 refills | Status: DC

## 2021-09-30 LAB — COLOGUARD: COLOGUARD: NEGATIVE

## 2021-10-01 ENCOUNTER — Other Ambulatory Visit: Payer: Self-pay | Admitting: Thoracic Surgery (Cardiothoracic Vascular Surgery)

## 2021-10-01 DIAGNOSIS — R911 Solitary pulmonary nodule: Secondary | ICD-10-CM

## 2021-10-02 NOTE — Progress Notes (Signed)
Left detailed message informing patient of lab results.

## 2021-10-02 NOTE — Progress Notes (Signed)
Left detailed message informing patient of results. LA

## 2021-10-04 DIAGNOSIS — C3430 Malignant neoplasm of lower lobe, unspecified bronchus or lung: Secondary | ICD-10-CM | POA: Insufficient documentation

## 2021-10-06 ENCOUNTER — Encounter: Payer: Self-pay | Admitting: Thoracic Surgery (Cardiothoracic Vascular Surgery)

## 2021-10-06 ENCOUNTER — Ambulatory Visit
Admission: RE | Admit: 2021-10-06 | Discharge: 2021-10-06 | Disposition: A | Discharge status: Home / Self Care | Payer: PPO | Source: Ambulatory Visit | Attending: Thoracic Surgery (Cardiothoracic Vascular Surgery) | Admitting: Thoracic Surgery (Cardiothoracic Vascular Surgery)

## 2021-10-06 ENCOUNTER — Ambulatory Visit (INDEPENDENT_AMBULATORY_CARE_PROVIDER_SITE_OTHER): Payer: Self-pay | Admitting: Thoracic Surgery (Cardiothoracic Vascular Surgery)

## 2021-10-06 VITALS — BP 120/82 | HR 128 | Resp 20 | Ht 63.0 in | Wt 187.0 lb

## 2021-10-06 DIAGNOSIS — R911 Solitary pulmonary nodule: Secondary | ICD-10-CM

## 2021-10-06 DIAGNOSIS — C3492 Malignant neoplasm of unspecified part of left bronchus or lung: Secondary | ICD-10-CM

## 2021-10-06 DIAGNOSIS — R0602 Shortness of breath: Secondary | ICD-10-CM | POA: Diagnosis not present

## 2021-10-06 NOTE — Progress Notes (Signed)
[address]       [place] [address]             [phone]     HPI: Mrs. Natalie Perry returns for a scheduled follow-up visit  Natalie Perry is a 61 year old woman with a history of PE, arthritis, asthma, hepatitis C, cirrhosis, bipolar disorder, remote tobacco abuse, and stage IIb adenocarcinoma of the lung.  She was first noted to have a lung nodule on a CT that was done to diagnose a pulmonary embolus.  On follow-up 6 months later it had increased in size.  It was hypermetabolic on PET/CT.  I did a robotic assisted left lower lobectomy on 08/31/2021.  It turned out to be a T2a, N1, stage IIb adenocarcinoma.  I saw her in the office on 09/15/2021.  She was having a lot of pain at that point.  She also had some swelling at one of her incisions.  This was consistent with a seroma.  In the interim since that visit she says that she developed more redness and swelling.  Dr. Bobby Rumpf put her on Levaquin, and the swelling and redness both improved dramatically.  She has a couple of more days left on that prescription.  She is not having any shortness of breath.  She does get fatigued easily.  Still has pain.  "It is kind of numb and hurts at the same time."  Past Medical History:  Diagnosis Date   Anxiety    Arthritis    Asthma    Bipolar affective disorder ([place])    Cirrhosis of liver due to hepatitis C    Hepatitis    Hypertension    Hypothyroidism    Pulmonary embolus ([place]) 10/2020   Thyroid disease     Current Outpatient Medications  Medication Sig Dispense Refill   acetaminophen (TYLENOL) 500 MG tablet Take 500 mg by mouth every 6 (six) hours as needed (pain.).     albuterol (PROVENTIL) (2.5 MG/3ML) 0.083% nebulizer solution INHALE 1 VIAL VIA NEBULIZER 3 TO 4 TIMES A DAY AS DIRECTED 300 mL 3   amLODipine (NORVASC) 5 MG tablet TAKE 1 TABLET BY MOUTH EVERY DAY (Patient taking differently: Take 5 mg by mouth every evening.) 30 tablet 2   betamethasone  dipropionate (DIPROLENE) 0.05 % ointment Apply 1 application. topically daily as needed (skin irritation.).     ELIQUIS 5 MG TABS tablet TAKE 1 TABLET(5 MG) BY MOUTH TWICE DAILY 60 tablet 0   levofloxacin (LEVAQUIN) 750 MG tablet Take 1 tablet (750 mg total) by mouth daily. 10 tablet 0   levothyroxine (SYNTHROID) 100 MCG tablet TAKE ONE TABLET BY MOUTH EVERY MORNING 90 tablet 0   LORazepam (ATIVAN) 1 MG tablet TAKE 1 TABLET(1 MG) BY MOUTH THREE TIMES DAILY AS NEEDED FOR ANXIETY 90 tablet 0   losartan-hydrochlorothiazide (HYZAAR) 100-25 MG tablet Take 1 tablet by mouth daily. 90 tablet 0   oxyCODONE (OXY IR/ROXICODONE) 5 MG immediate release tablet Take 1 tablet (5 mg total) by mouth 3 (three) times daily as needed. 30 tablet 0   Polyethyl Glycol-Propyl Glycol (LUBRICANT EYE DROPS) 0.4-0.3 % SOLN Place 1-2 drops into both eyes 3 (three) times daily as needed (dry/irritated eyes.).     potassium chloride SA (KLOR-CON M) 20 MEQ tablet Take 2 tablets (40 mEq total) by mouth 2 (two) times daily. 360 tablet 0   pregabalin (LYRICA) 50 MG capsule Take 1 capsule (50 mg total) by mouth 2 (two) times daily. [place]  capsule 1   pregabalin (LYRICA) 50 MG capsule Take 1 capsule (50 mg total) by mouth 2 (two) times daily. 60 capsule 1   promethazine (PHENERGAN) 25 MG tablet Take 1 tablet (25 mg total) by mouth every 8 (eight) hours as needed for nausea or vomiting. 30 tablet 2   SYMBICORT 160-4.5 MCG/ACT inhaler Inhale 2 puffs into the lungs 2 (two) times daily. (Patient taking differently: Inhale 1 puff into the lungs 2 (two) times daily.) 1 each 12   Vitamin D, Ergocalciferol, (DRISDOL) 1.25 MG (50000 UNIT) CAPS capsule TAKE 1 CAPSULE BY MOUTH EVERY 7 DAYS (Patient taking differently: Take 50,000 Units by mouth every Wednesday.) 12 capsule 1   No current facility-administered medications for this visit.    Physical Exam BP 120/82 (BP Location: Right Arm, Patient Position: Sitting, Cuff Size: Normal)   Pulse (!)  128   Resp 20   Ht 5\' 3"  (1.6 m)   Wt 187 lb (84.8 kg)   SpO2 96% Comment: RA  BMI 33.90 kg/m  61 year old woman in no acute distress Alert and oriented x3 with no focal deficits Lungs absent breath sounds left base otherwise clear Cardiac tachycardic (108 bpm), regular Incisions intact, resolving cellulitis posteriorly  Diagnostic Tests: CHEST - 2 VIEW   COMPARISON:  09/15/2021   FINDINGS: Improving airspace opacities at the left lung base. Persistent small effusions suspected. Right lung clear.   Heart size and mediastinal contours are within normal limits.   No pneumothorax.   Visualized bones unremarkable.  Cholecystectomy clips.   IMPRESSION: Stable postop changes at the left lung base with small effusion. No acute findings.     Electronically Signed   By: Lucrezia Europe M.D.   On: 10/06/2021 15:24 I personally reviewed the chest x-ray images.  There is still opacity at the left base but some improved aeration in that area compared to her previous film.  Impression: Natalie Perry  is a 61 year old woman with a history of PE, arthritis, asthma, hepatitis C, cirrhosis, bipolar disorder, remote tobacco abuse, and stage IIb adenocarcinoma of the lung.  She is now about a month out from a robotic assisted left lower lobectomy.  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  That is not uncommon this early after surgery, particularly in women as a have smaller spaces between the ribs.  That should continue to improve with time.  She is taking Lyrica and should remain on that for about 6 months.  Cellulitis-improved with Levaquin.  Tachycardia-she is always been tachycardic even when I saw her preoperatively.  When I examined her today her heart rate was 108 although she had a rate of 128 when she was getting her vitals checked.  Possibly anxiety related.  Plan: Follow-up as scheduled with Dr. Bobby Rumpf Complete antibiotics Return in 6 weeks to check on progress  [name], MD [facility] [phone]

## 2021-10-08 ENCOUNTER — Other Ambulatory Visit: Payer: Self-pay

## 2021-10-08 ENCOUNTER — Other Ambulatory Visit: Payer: Self-pay | Admitting: Thoracic Surgery (Cardiothoracic Vascular Surgery)

## 2021-10-08 ENCOUNTER — Telehealth: Payer: Self-pay

## 2021-10-08 DIAGNOSIS — G8929 Other chronic pain: Secondary | ICD-10-CM

## 2021-10-08 MED ORDER — OXYCODONE HCL 5 MG PO TABS: 5.0000 mg | ORAL_TABLET | Freq: Three times a day (TID) | ORAL | 0 refills | Status: DC | PRN

## 2021-10-08 MED ORDER — APIXABAN 5 MG PO TABS: ORAL_TABLET | ORAL | 2 refills | Status: DC

## 2021-10-08 NOTE — Progress Notes (Signed)
Prescription for oxycodone 5 mg PO TID PRN, # 15. No refills. sent to [facility]. Roxan Hockey, MD [facility] [phone]

## 2021-10-08 NOTE — Progress Notes (Signed)
Chronic Care Management Pharmacy Assistant   Name: ELLINOR TEST  MRN: [id number] DOB: 1961/05/10   Reason for Encounter: Medication Coordination for Upstream    Recent office visits:  None  Recent consult visits:  10/06/21 (Cardiothoracic Surgery) Modesto Charon MD. Seen for Adenocarcinoma of left lung. No med changes.   09/28/21 (Oncology) Lavera Guise MD. Seen for Malignant Neoplasm. No med changes.   09/25/21 (Cardiothoracic Surgery)  Jadene Pierini PA-C. Orders Only. Ordered Lyrica 50mg  two times daily.   Hospital visits:  None  Medications: Outpatient Encounter Medications as of 10/08/2021  Medication Sig   acetaminophen (TYLENOL) 500 MG tablet Take 500 mg by mouth every 6 (six) hours as needed (pain.).   albuterol (PROVENTIL) (2.5 MG/3ML) 0.083% nebulizer solution INHALE 1 VIAL VIA NEBULIZER 3 TO 4 TIMES A DAY AS DIRECTED   amLODipine (NORVASC) 5 MG tablet TAKE 1 TABLET BY MOUTH EVERY DAY (Patient taking differently: Take 5 mg by mouth every evening.)   betamethasone dipropionate (DIPROLENE) 0.05 % ointment Apply 1 application. topically daily as needed (skin irritation.).   ELIQUIS 5 MG TABS tablet TAKE 1 TABLET(5 MG) BY MOUTH TWICE DAILY   levofloxacin (LEVAQUIN) 750 MG tablet Take 1 tablet (750 mg total) by mouth daily.   levothyroxine (SYNTHROID) 100 MCG tablet TAKE ONE TABLET BY MOUTH EVERY MORNING   LORazepam (ATIVAN) 1 MG tablet TAKE 1 TABLET(1 MG) BY MOUTH THREE TIMES DAILY AS NEEDED FOR ANXIETY   losartan-hydrochlorothiazide (HYZAAR) 100-25 MG tablet Take 1 tablet by mouth daily.   oxyCODONE (OXY IR/ROXICODONE) 5 MG immediate release tablet Take 1 tablet (5 mg total) by mouth 3 (three) times daily as needed.   Polyethyl Glycol-Propyl Glycol (LUBRICANT EYE DROPS) 0.4-0.3 % SOLN Place 1-2 drops into both eyes 3 (three) times daily as needed (dry/irritated eyes.).   potassium chloride SA (KLOR-CON M) 20 MEQ tablet Take 2 tablets (40 mEq total) by mouth 2 (two)  times daily.   pregabalin (LYRICA) 50 MG capsule Take 1 capsule (50 mg total) by mouth 2 (two) times daily.   pregabalin (LYRICA) 50 MG capsule Take 1 capsule (50 mg total) by mouth 2 (two) times daily.   promethazine (PHENERGAN) 25 MG tablet Take 1 tablet (25 mg total) by mouth every 8 (eight) hours as needed for nausea or vomiting.   SYMBICORT 160-4.5 MCG/ACT inhaler Inhale 2 puffs into the lungs 2 (two) times daily. (Patient taking differently: Inhale 1 puff into the lungs 2 (two) times daily.)   Vitamin D, Ergocalciferol, (DRISDOL) 1.25 MG (50000 UNIT) CAPS capsule TAKE 1 CAPSULE BY MOUTH EVERY 7 DAYS (Patient taking differently: Take 50,000 Units by mouth every Wednesday.)   No facility-administered encounter medications on file as of 10/08/2021.   Reviewed chart for medication changes ahead of medication coordination call.  No OVs, or hospital visits since last care coordination call/Pharmacist visit.   No medication changes indicated OR if recent visit, treatment plan here.  BP Readings from Last 3 Encounters:  10/06/21 120/82  09/28/21 128/70  09/15/21 131/82    No results found for: HGBA1C   Patient obtains medications through Vials  90 Days   Last adherence delivery included:  Potassium Chloride 5meq 2 tabs twice daily Levothyroxine 174mcg 1 tab every morning Losartan-HCTZ 100-25mg  1 tab once daily Budesonide-Formot Inhaler 160-4.5 2 puffs twice daily Albuterol Inhaler 96mcg 1-2 puffs every 4-6 hours prn   Patient declined (meds) last  Amlodipine 5mg - Gets at [facility]  Eliquis 5mg - Wants to get at  [facility] Lorazepam 1mg -Wants to get at [place] at [facility] Citalopram 20mg - Gets at [facility]   Patient is due for next adherence delivery on: 10/20/21. Called patient and reviewed medications and coordinated delivery.  This delivery to include: Eliquis 5mg  -1 tab twice daily (30DS)  Potassium Chloride 21meq 2 tabs twice daily Levothyroxine 196mcg 1  tab every morning Losartan-HCTZ 100-25mg  1 tab once daily Budesonide-Formot Inhaler 160-4.5 2 puffs twice daily Albuterol Inhaler 31mcg 1-2 puffs every 4-6 hours prn   Patient declined the following medications Amlodipine 5mg - Gets at [facility] on 08/25/21 30ds. Pt will get another supply through [facility]  Lorazepam 1mg -Wants to get at [facility] on 09/21/21 30ds Vitamin D- Gets at [facility] Citalopram 20mg - Gets at [facility]  Pregabalin 50mg - Received at [facility] on 09/27/21 30ds  Patient needs refills - Request Sent  Eliquis 5mg   Confirmed delivery date of 10/20/21, advised patient that pharmacy will contact them the morning of delivery.  Elray Mcgregor, [place] Pharmacist Assistant  [phone]

## 2021-10-11 NOTE — Progress Notes (Signed)
[facility]  [address] [place],  [place]  [address] [phone]  Clinic Day:  10/12/2021  Referring physician: [name], PA-C  HISTORY OF PRESENT ILLNESS:  The patient is a 61 y.o. female will stage IIB (T1N1 M0) lung adenocarcinoma, status post a left lower lobe lobectomy in April 2023.  She comes in today to reassess her clinical status to see if she is .  healthy enough to begin her adjuvant chemotherapy.  Overall, the patient still feels fairly weak.  Although she says she exercises at home, there appears to have not been much progress with her baseline strength.  On a positive note, she believes the cellulitis that was over her left chest wall has essentially dissipated after taking her levofloxacin for 10 days.  As it pertains to her lung cancer, she denies having shortness of breath, hemoptysis, or other respiratory symptoms which concern her for early disease recurrence.  With respect to her left lower lobectomy in April 2023, her mass measured 2.1 cm with 1/17 lymph nodes being positive for disease.  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  Labs dating back to 2011 have shown her platelet count consistently around 100K.  As she has not been severely thrombocytopenic, her platelets have been followed conservatively.    PHYSICAL EXAM:  Blood pressure 133/87, pulse 90, temperature 98.5 F (36.9 C), resp. rate 16, height _0  (1.6 m), weight 186 lb 4.8 oz (84.5 kg), SpO2 94 %. Wt Readings from Last 3 Encounters:  10/12/21 186 lb 4.8 oz (84.5 kg)  10/06/21 187 lb (84.8 kg)  09/28/21 187 lb 9.6 oz (85.1 kg)   Body mass index is 33 kg/m. Performance status (ECOG): 1 - Symptomatic but completely ambulatory Physical Exam Constitutional:      Appearance: Normal appearance.  She is not ill-appearing.     Comments: A weak appearing woman in a wheelchair.  HENT:     Mouth/Throat:     Mouth: Mucous membranes are moist.     Pharynx: Oropharynx is clear. No oropharyngeal exudate or posterior oropharyngeal erythema.  Cardiovascular:     Rate and Rhythm: Normal rate and regular rhythm.     Heart sounds: No murmur heard.   No friction rub. No gallop.  Pulmonary:     Effort: Pulmonary effort is normal. No respiratory distress.     Breath sounds: Normal breath sounds. No wheezing, rhonchi or rales.  Abdominal:     General: Bowel sounds are normal. There is no distension.     Palpations: Abdomen is soft. There is no mass.     Tenderness: There is no abdominal tenderness.  Musculoskeletal:        General: No swelling.     Right lower leg: No edema.     Left lower leg: No edema.  Lymphadenopathy:     Cervical: No cervical adenopathy.     Upper Body:     Right upper body: No supraclavicular or axillary adenopathy.     Left upper body: No supraclavicular or axillary adenopathy.     Lower Body: No right inguinal adenopathy. No left inguinal adenopathy.  Skin:    General: Skin is warm.     Coloration: Skin is not jaundiced.     Findings: No erythema, lesion or rash.  Comments:    Neurological:     General: No focal deficit present.     Mental Status: She is alert and oriented to person, place, and time. Mental status is at baseline.  Psychiatric:        Mood and Affect: Mood normal.        Behavior: Behavior normal.        Thought Content: Thought content normal.   ASSESSMENT & PLAN:  Assessment/Plan:  A 61 y.o. female with stage IIB (T1 N1 M0) lung adenocarcinoma, status post a left lower lobe lobectomy in April 2023.  Ultimately, the hope was that this patient would be healthy enough today to begin adjuvant chemotherapy in the forthcoming days.  Unfortunately, it does not appear that she has progressed to where I feel that she is physically ready to begin  adjuvant cisplatin-based chemotherapy.  Based upon this, I will have physical therapy work diligently with her over these next few weeks to get her strength and stamina back to a more optimal level to where she will be physically ready to proceed with adjuvant chemotherapy.  I will see her back in 1 month to determine if she will be ready at that time to begin adjuvant chemotherapy.  The patient understands all the plans discussed today and is in agreement with them.   Courtlynn Holloman Macarthur Critchley, MD

## 2021-10-12 ENCOUNTER — Inpatient Hospital Stay: Payer: PPO | Attending: Oncology | Admitting: Oncology

## 2021-10-12 ENCOUNTER — Inpatient Hospital Stay: Payer: PPO

## 2021-10-12 VITALS — BP 133/87 | HR 90 | Temp 98.5°F | Resp 16 | Ht 63.0 in | Wt 186.3 lb

## 2021-10-12 DIAGNOSIS — C3432 Malignant neoplasm of lower lobe, left bronchus or lung: Secondary | ICD-10-CM

## 2021-10-15 ENCOUNTER — Telehealth: Payer: Self-pay

## 2021-10-15 ENCOUNTER — Other Ambulatory Visit: Payer: Self-pay

## 2021-10-15 DIAGNOSIS — R161 Splenomegaly, not elsewhere classified: Secondary | ICD-10-CM | POA: Diagnosis not present

## 2021-10-15 DIAGNOSIS — B1920 Unspecified viral hepatitis C without hepatic coma: Secondary | ICD-10-CM | POA: Diagnosis not present

## 2021-10-15 DIAGNOSIS — K7460 Unspecified cirrhosis of liver: Secondary | ICD-10-CM | POA: Diagnosis not present

## 2021-10-15 DIAGNOSIS — Z7901 Long term (current) use of anticoagulants: Secondary | ICD-10-CM | POA: Diagnosis not present

## 2021-10-15 DIAGNOSIS — Z79899 Other long term (current) drug therapy: Secondary | ICD-10-CM | POA: Diagnosis not present

## 2021-10-15 DIAGNOSIS — C3492 Malignant neoplasm of unspecified part of left bronchus or lung: Secondary | ICD-10-CM | POA: Diagnosis not present

## 2021-10-15 DIAGNOSIS — D696 Thrombocytopenia, unspecified: Secondary | ICD-10-CM | POA: Diagnosis not present

## 2021-10-15 DIAGNOSIS — C3432 Malignant neoplasm of lower lobe, left bronchus or lung: Secondary | ICD-10-CM | POA: Diagnosis not present

## 2021-10-15 NOTE — Telephone Encounter (Addendum)
10/16/21 - Dr Bobby Rumpf notified of requested orders below and is agreeable to them. I notified Gretchen,PT @ [facility].  10/15/21 Leana Gamer, w/[facility], req verbal orders for PT visits. She would like to see pt in home twice weekly for 3 weeks, then once weekly for 2 weeks.

## 2021-10-22 ENCOUNTER — Telehealth: Payer: PPO

## 2021-10-26 DIAGNOSIS — J45998 Other asthma: Secondary | ICD-10-CM | POA: Diagnosis not present

## 2021-11-02 ENCOUNTER — Other Ambulatory Visit: Payer: Self-pay | Admitting: Physician Assistant

## 2021-11-12 ENCOUNTER — Telehealth: Payer: Self-pay

## 2021-11-12 NOTE — Telephone Encounter (Signed)
Pt has called. She wants to know if you are ready to see her or if she needs to have port placed first?   Appt given for 11/16/2021.

## 2021-11-15 NOTE — Progress Notes (Signed)
[facility]  [address] [place],  [place]  [address] [phone]  Clinic Day:  11/16/2021  Referring physician: [name], PA-C  HISTORY OF PRESENT ILLNESS:  The patient is a 61 y.o. female will stage IIB (T1N1 M0) lung adenocarcinoma, status post a left lower lobe lobectomy in April 2023.  With respect to her left lower lobectomy in April 2023, her mass measured 2.1 cm with 1/17 lymph nodes being positive for disease.  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  She comes in today to be reassessed to determine if she is now healthy enough to undergo adjuvant chemotherapy.  Overall, the patient claims to be doing much better.  Although she still has a moderate degree of chest wall pain from her lung cancer surgery, she denies having any respiratory symptoms or other findings which concern her for early disease recurrence.   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  Labs dating back to 2011 have shown her platelet count consistently around 100K.  As she has not been severely thrombocytopenic, her platelets have been followed conservatively.    PHYSICAL EXAM:  Blood pressure 131/81, pulse (!) 109, temperature 98.1 F (36.7 C), resp. rate 12, height 5' 3"  (1.6 m), weight 183 lb 1.6 oz (83.1 kg), SpO2 96 %. Wt Readings from Last 3 Encounters:  11/16/21 183 lb 1.6 oz (83.1 kg)  10/12/21 186 lb 4.8 oz (84.5 kg)  10/06/21 187 lb (84.8 kg)   Body mass index is 32.43 kg/m. Performance status (ECOG): 1 - Symptomatic but completely ambulatory Physical Exam Constitutional:      Appearance: Normal appearance. She is not ill-appearing.     Comments: She looks much stronger versus previous visits.  She is ambulating on her own power.  HENT:     Mouth/Throat:     Mouth: Mucous membranes are moist.     Pharynx: Oropharynx is clear.  No oropharyngeal exudate or posterior oropharyngeal erythema.  Cardiovascular:     Rate and Rhythm: Normal rate and regular rhythm.     Heart sounds: No murmur heard.    No friction rub. No gallop.  Pulmonary:     Effort: Pulmonary effort is normal. No respiratory distress.     Breath sounds: Normal breath sounds. No wheezing, rhonchi or rales.  Abdominal:     General: Bowel sounds are normal. There is no distension.     Palpations: Abdomen is soft. There is no mass.     Tenderness: There is no abdominal tenderness.  Musculoskeletal:        General: No swelling.     Right lower leg: No edema.     Left lower leg: No edema.  Lymphadenopathy:     Cervical: No cervical adenopathy.     Upper Body:     Right upper body: No supraclavicular or axillary adenopathy.     Left upper body: No supraclavicular or axillary adenopathy.     Lower Body: No right inguinal adenopathy. No left inguinal adenopathy.  Skin:    General: Skin is warm.     Coloration: Skin is not jaundiced.     Findings: No erythema, lesion or rash.     Comments:    Neurological:     General: No focal deficit present.     Mental Status: She is alert and oriented to person, place, and  time. Mental status is at baseline.  Psychiatric:        Mood and Affect: Mood normal.        Behavior: Behavior normal.        Thought Content: Thought content normal.    ASSESSMENT & PLAN:  Assessment/Plan:  A 61 y.o. female with stage IIB (T1 N1 M0) lung adenocarcinoma, status post a left lower lobe lobectomy in April 2023.  As the patient clearly looks much better today, she appears to be ready to begin adjuvant chemotherapy.  Based upon her adenocarcinoma histology, I will give her 4 cycles of adjuvant cisplatin/pemetrexed.  The patient understands the goal of chemotherapy is to ultimately cure her of disease by eradicating any microscopic cells that still may be present.  She was made aware of the side effects which go along this  chemotherapy regimen, including nausea, vomiting, cytopenias, fatigue, and vitamin deficiencies.  I will arrange for her to have a port placed through which her 4 cycles of adjuvant chemotherapy will be given.  Her first cycle of cisplatin/pemetrexed is scheduled for Monday, July 24th.  I will see her 3 weeks later before she heads into her second cycle of adjuvant chemotherapy.  The patient understands all the plans discussed today and is in agreement with them.  Sherree Shankman Macarthur Critchley, MD

## 2021-11-16 ENCOUNTER — Other Ambulatory Visit: Payer: Self-pay | Admitting: Oncology

## 2021-11-16 ENCOUNTER — Other Ambulatory Visit: Payer: Self-pay | Admitting: Thoracic Surgery (Cardiothoracic Vascular Surgery)

## 2021-11-16 ENCOUNTER — Inpatient Hospital Stay: Payer: PPO | Attending: Oncology | Admitting: Oncology

## 2021-11-16 ENCOUNTER — Inpatient Hospital Stay: Payer: PPO

## 2021-11-16 VITALS — BP 131/81 | HR 109 | Temp 98.1°F | Resp 12 | Ht 63.0 in | Wt 183.1 lb

## 2021-11-16 DIAGNOSIS — I10 Essential (primary) hypertension: Secondary | ICD-10-CM | POA: Insufficient documentation

## 2021-11-16 DIAGNOSIS — Z5111 Encounter for antineoplastic chemotherapy: Secondary | ICD-10-CM | POA: Insufficient documentation

## 2021-11-16 DIAGNOSIS — C3430 Malignant neoplasm of lower lobe, unspecified bronchus or lung: Secondary | ICD-10-CM

## 2021-11-16 DIAGNOSIS — Z1231 Encounter for screening mammogram for malignant neoplasm of breast: Secondary | ICD-10-CM | POA: Diagnosis not present

## 2021-11-16 DIAGNOSIS — D649 Anemia, unspecified: Secondary | ICD-10-CM | POA: Diagnosis not present

## 2021-11-16 DIAGNOSIS — E538 Deficiency of other specified B group vitamins: Secondary | ICD-10-CM | POA: Insufficient documentation

## 2021-11-16 DIAGNOSIS — C3432 Malignant neoplasm of lower lobe, left bronchus or lung: Secondary | ICD-10-CM | POA: Diagnosis not present

## 2021-11-16 NOTE — Progress Notes (Signed)
START ON PATHWAY REGIMEN - Non-Small Cell Lung     A cycle is every 21 days:     Pemetrexed      Cisplatin   **Always confirm dose/schedule in your pharmacy ordering system**  Patient Characteristics: Postoperative without Neoadjuvant Therapy (Pathologic Staging), Stage IIB, Adjuvant Chemotherapy, Nonsquamous Cell Therapeutic Status: Postoperative without Neoadjuvant Therapy (Pathologic Staging) AJCC T Category: pT1c AJCC N Category: pN1 AJCC M Category: cM0 AJCC 8 Stage Grouping: IIB Histology: Nonsquamous Cell Intent of Therapy: Curative Intent, Discussed with Patient

## 2021-11-17 ENCOUNTER — Other Ambulatory Visit: Payer: Self-pay | Admitting: Pharmacist

## 2021-11-17 ENCOUNTER — Inpatient Hospital Stay: Payer: PPO

## 2021-11-17 ENCOUNTER — Other Ambulatory Visit: Payer: Self-pay | Admitting: Oncology

## 2021-11-17 ENCOUNTER — Ambulatory Visit
Admission: RE | Admit: 2021-11-17 | Discharge: 2021-11-17 | Disposition: A | Discharge status: Home / Self Care | Payer: PPO | Source: Ambulatory Visit | Attending: Thoracic Surgery (Cardiothoracic Vascular Surgery) | Admitting: Thoracic Surgery (Cardiothoracic Vascular Surgery)

## 2021-11-17 ENCOUNTER — Ambulatory Visit (INDEPENDENT_AMBULATORY_CARE_PROVIDER_SITE_OTHER): Payer: Self-pay | Admitting: Thoracic Surgery (Cardiothoracic Vascular Surgery)

## 2021-11-17 ENCOUNTER — Encounter: Payer: Self-pay | Admitting: Thoracic Surgery (Cardiothoracic Vascular Surgery)

## 2021-11-17 ENCOUNTER — Inpatient Hospital Stay (INDEPENDENT_AMBULATORY_CARE_PROVIDER_SITE_OTHER): Payer: PPO | Admitting: Hematology and Oncology

## 2021-11-17 ENCOUNTER — Encounter: Payer: Self-pay | Admitting: Hematology and Oncology

## 2021-11-17 VITALS — BP 120/72 | HR 133 | Temp 97.4°F | Resp 13 | Ht 61.0 in | Wt 184.4 lb

## 2021-11-17 VITALS — BP 120/81 | HR 90 | Resp 20 | Ht 61.0 in | Wt 186.0 lb

## 2021-11-17 VITALS — BP 116/77 | HR 109 | Temp 98.0°F | Resp 18 | Ht 61.0 in | Wt 184.0 lb

## 2021-11-17 DIAGNOSIS — C3432 Malignant neoplasm of lower lobe, left bronchus or lung: Secondary | ICD-10-CM

## 2021-11-17 DIAGNOSIS — M25561 Pain in right knee: Secondary | ICD-10-CM

## 2021-11-17 DIAGNOSIS — E538 Deficiency of other specified B group vitamins: Secondary | ICD-10-CM | POA: Diagnosis not present

## 2021-11-17 DIAGNOSIS — Z902 Acquired absence of lung [part of]: Secondary | ICD-10-CM

## 2021-11-17 DIAGNOSIS — J811 Chronic pulmonary edema: Secondary | ICD-10-CM | POA: Diagnosis not present

## 2021-11-17 DIAGNOSIS — I10 Essential (primary) hypertension: Secondary | ICD-10-CM | POA: Diagnosis not present

## 2021-11-17 DIAGNOSIS — G8929 Other chronic pain: Secondary | ICD-10-CM

## 2021-11-17 DIAGNOSIS — Z5111 Encounter for antineoplastic chemotherapy: Secondary | ICD-10-CM | POA: Diagnosis not present

## 2021-11-17 DIAGNOSIS — M25562 Pain in left knee: Secondary | ICD-10-CM

## 2021-11-17 DIAGNOSIS — C3430 Malignant neoplasm of lower lobe, unspecified bronchus or lung: Secondary | ICD-10-CM

## 2021-11-17 DIAGNOSIS — C3492 Malignant neoplasm of unspecified part of left bronchus or lung: Secondary | ICD-10-CM

## 2021-11-17 MED ORDER — OXYCODONE HCL 5 MG PO TABS: 5.0000 mg | ORAL_TABLET | Freq: Three times a day (TID) | ORAL | 0 refills | Status: DC | PRN

## 2021-11-17 MED ORDER — FOLIC ACID 1 MG PO TABS: 1.0000 mg | ORAL_TABLET | Freq: Every day | ORAL | 3 refills | Status: DC

## 2021-11-17 MED ORDER — PROCHLORPERAZINE MALEATE 10 MG PO TABS: 10.0000 mg | ORAL_TABLET | Freq: Four times a day (QID) | ORAL | 3 refills | Status: DC | PRN

## 2021-11-17 MED ORDER — CYANOCOBALAMIN 1000 MCG/ML IJ SOLN
1000.0000 ug | Freq: Once | INTRAMUSCULAR | Status: AC
  Administered 2021-11-17: 1000 ug via INTRAMUSCULAR
  Filled 2021-11-17: qty 1

## 2021-11-17 MED ORDER — ONDANSETRON HCL 4 MG PO TABS: 4.0000 mg | ORAL_TABLET | ORAL | 3 refills | Status: DC | PRN

## 2021-11-17 NOTE — Patient Instructions (Signed)
Vitamin B12 Injection What is this medication? Vitamin B12 (VAHY tuh min B12) prevents and treats low vitamin B12 levels in your body. It is used in people who do not get enough vitamin B12 from their diet or when their digestive tract does not absorb enough. Vitamin B12 plays an important role in maintaining the health of your nervous system and red blood cells. This medicine may be used for other purposes; ask your health care provider or pharmacist if you have questions. COMMON BRAND NAME(S): B-12 Compliance Kit, B-12 Injection Kit, Cyomin, Dodex, LA-12, Nutri-Twelve, Physicians EZ Use B-12, Primabalt What should I tell my care team before I take this medication? They need to know if you have any of these conditions: Kidney disease Leber's disease Megaloblastic anemia An unusual or allergic reaction to cyanocobalamin, cobalt, other medications, foods, dyes, or preservatives Pregnant or trying to get pregnant Breast-feeding How should I use this medication? This medication is injected into a muscle or deeply under the skin. It is usually given in a clinic or care team's office. However, your care team may teach you how to inject yourself. Follow all instructions. Talk to your care team about the use of this medication in children. Special care may be needed. Overdosage: If you think you have taken too much of this medicine contact a poison control center or emergency room at once. NOTE: This medicine is only for you. Do not share this medicine with others. What if I miss a dose? If you are given your dose at a clinic or care team's office, call to reschedule your appointment. If you give your own injections, and you miss a dose, take it as soon as you can. If it is almost time for your next dose, take only that dose. Do not take double or extra doses. What may interact with this medication? Alcohol Colchicine This list may not describe all possible interactions. Give your health care  provider a list of all the medicines, herbs, non-prescription drugs, or dietary supplements you use. Also tell them if you smoke, drink alcohol, or use illegal drugs. Some items may interact with your medicine. What should I watch for while using this medication? Visit your care team regularly. You may need blood work done while you are taking this medication. You may need to follow a special diet. Talk to your care team. Limit your alcohol intake and avoid smoking to get the best benefit. What side effects may I notice from receiving this medication? Side effects that you should report to your care team as soon as possible: Allergic reactions--skin rash, itching, hives, swelling of the face, lips, tongue, or throat Swelling of the ankles, hands, or feet Trouble breathing Side effects that usually do not require medical attention (report to your care team if they continue or are bothersome): Diarrhea This list may not describe all possible side effects. Call your doctor for medical advice about side effects. You may report side effects to FDA at 1-800-FDA-1088. Where should I keep my medication? Keep out of the reach of children. Store at room temperature between 15 and 30 degrees C (59 and 85 degrees F). Protect from light. Throw away any unused medication after the expiration date. NOTE: This sheet is a summary. It may not cover all possible information. If you have questions about this medicine, talk to your doctor, pharmacist, or health care provider.  2023 Elsevier/Gold Standard (2021-01-06 00:00:00)

## 2021-11-17 NOTE — Progress Notes (Signed)
[address]        [place] [address]             [place]    HPI: Natalie Perry returns for a scheduled follow-up visit  Natalie Perry is a 61 year old woman with a history of PE, arthritis, asthma, hepatitis C, cirrhosis, bipolar disorder, remote tobacco abuse, and a stage IIb adenocarcinoma of the lung.  She was noted to have a lung nodule on a CT that was done to diagnose a pulmonary embolus.  At 6 months it had increased in size.  On PET/CT it was hypermetabolic.  I did a robotic left lower lobectomy on 08/31/2021.  It turned out to be a T2 a, N1, stage IIb adenocarcinoma.  Postoperatively she has had some issues with pain and cellulitis at her incisions.  Cellulitis resolved with antibiotics.  She is on Lyrica 50 mg twice daily.  Initially she was having to take oxycodone relatively frequently but now she is only taking it "once in a while."  She went through physical therapy and is feeling much better.  She is scheduled to start chemotherapy in about 2 weeks.  Past Medical History:  Diagnosis Date   Anxiety    Arthritis    Asthma    Bipolar affective disorder ([place])    Cirrhosis of liver due to hepatitis C    Hepatitis    Hypertension    Hypothyroidism    Pulmonary embolus ([place]) 10/2020   Thyroid disease      Current Outpatient Medications  Medication Sig Dispense Refill   acetaminophen (TYLENOL) 500 MG tablet Take 500 mg by mouth every 6 (six) hours as needed (pain.).     albuterol (PROVENTIL) (2.5 MG/3ML) 0.083% nebulizer solution INHALE 1 VIAL VIA NEBULIZER 3 TO 4 TIMES A DAY AS DIRECTED 300 mL 3   albuterol (VENTOLIN HFA) 108 (90 Base) MCG/ACT inhaler SMARTSIG:1-2 Puff(s) Via Inhaler Every 4-6 Hours PRN     amLODipine (NORVASC) 5 MG tablet TAKE 1 TABLET BY MOUTH EVERY DAY 30 tablet 2   apixaban (ELIQUIS) 5 MG TABS tablet Take 1 tablet by mouth twice daily 60 tablet 2   betamethasone dipropionate (DIPROLENE) 0.05 % ointment Apply 1 application.  topically daily as needed (skin irritation.).     folic acid (FOLVITE) 1 MG tablet Take 1 tablet (1 mg total) by mouth daily. 60 tablet 3   levofloxacin (LEVAQUIN) 750 MG tablet Take 1 tablet (750 mg total) by mouth daily. 10 tablet 0   levothyroxine (SYNTHROID) 100 MCG tablet TAKE ONE TABLET BY MOUTH EVERY MORNING 90 tablet 0   LORazepam (ATIVAN) 1 MG tablet TAKE 1 TABLET(1 MG) BY MOUTH THREE TIMES DAILY AS NEEDED FOR ANXIETY 90 tablet 0   losartan-hydrochlorothiazide (HYZAAR) 100-25 MG tablet Take 1 tablet by mouth daily. 90 tablet 0   ondansetron (ZOFRAN) 4 MG tablet Take 1 tablet (4 mg total) by mouth every 4 (four) hours as needed for nausea. 90 tablet 3   Polyethyl Glycol-Propyl Glycol (LUBRICANT EYE DROPS) 0.4-0.3 % SOLN Place 1-2 drops into both eyes 3 (three) times daily as needed (dry/irritated eyes.).     potassium chloride SA (KLOR-CON M) 20 MEQ tablet Take 2 tablets (40 mEq total) by mouth 2 (two) times daily. 360 tablet 0   pregabalin (LYRICA) 50 MG capsule Take 1 capsule (50 mg total) by mouth 2 (two) times daily. 60 capsule 1   prochlorperazine (COMPAZINE) 10 MG tablet Take 1 tablet (10 mg  total) by mouth every 6 (six) hours as needed for nausea or vomiting. 90 tablet 3   promethazine (PHENERGAN) 25 MG tablet Take 1 tablet (25 mg total) by mouth every 8 (eight) hours as needed for nausea or vomiting. 30 tablet 2   SYMBICORT 160-4.5 MCG/ACT inhaler Inhale 2 puffs into the lungs 2 (two) times daily. (Patient taking differently: Inhale 1 puff into the lungs 2 (two) times daily.) 1 each 12   Vitamin D, Ergocalciferol, (DRISDOL) 1.25 MG (50000 UNIT) CAPS capsule TAKE 1 CAPSULE BY MOUTH EVERY 7 DAYS (Patient taking differently: Take 50,000 Units by mouth every Wednesday.) 12 capsule 1   oxyCODONE (OXY IR/ROXICODONE) 5 MG immediate release tablet Take 1 tablet (5 mg total) by mouth 3 (three) times daily as needed. 10 tablet 0   No current facility-administered medications for this visit.     Physical Exam BP 120/81 (BP Location: Left Arm, Patient Position: Sitting)   Pulse 90   Resp 20   Ht 5\' 1"  (1.549 m)   Wt 186 lb (84.4 kg)   SpO2 94% Comment: RA  BMI 35.23 kg/m  61 year old woman in no acute distress Alert and oriented x3 with no focal deficits Incisions well-healed Lungs diminished at left base but otherwise clear  Diagnostic Tests: CHEST - 2 VIEW   COMPARISON:  Two-view chest x-ray 10/06/2021 and 09/15/2021. CT chest without contrast 07/03/2021 and PET scan 07/29/2021 at [facility].   FINDINGS: Heart size is normal. Stable volume loss noted in left lower lobe. Mild left-sided pulmonary vascular congestion is present. No pneumothorax is present. Right lung is clear. Visualized soft tissues and bony thorax are unremarkable. Surgical clips are present at the gallbladder fossa.   IMPRESSION: 1. Mild left-sided pulmonary vascular congestion without frank edema. 2. Stable volume loss in the left lower lobe.     Electronically Signed   By: San Morelle M.D.   On: 11/17/2021 14:37 I personally reviewed her chest x-ray images and concur with the findings noted above.  Impression: Natalie Perry is a 61 year old woman with a history of PE, arthritis, asthma, hepatitis C, cirrhosis, bipolar disorder, remote tobacco abuse, and a stage IIb adenocarcinoma of the lung.  She had a CT scan to evaluate for PE, which she did have.  She was also noted to have a lung nodule.  On follow-up that nodule got bigger and it was hypermetabolic on PET/CT.  She had a robotic assisted left lower lobectomy in April 2023.  The nodule turned out to be a stage IIb adenocarcinoma.  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  Will still take oxycodone, but rarely needs it.  She is on Lyrica 50 mg twice daily.  She is running very low on oxycodone as her last prescription 6 weeks ago was for only 15 pills.  I went ahead and gave her a prescription for  10 additional pills today 5 mg p.o. every 6 hours as needed.  There are no restrictions on her activities from my standpoint.  She starts chemotherapy in about 2 weeks.  Plan: Follow-up with Dr. Bobby Rumpf I will plan to see her back in about 6 months with a PA and lateral chest x-ray just to check on her progress.  [name], MD [facility] [phone]

## 2021-11-17 NOTE — Progress Notes (Signed)
[place] NOTE  Patient Care Team: [name], PA-C as PCP - General (Physician Assistant) Burnice Logan, [facility] (Inactive) as Pharmacist (Pharmacist) [name], MD as Consulting Physician (Oncology) [name], MD as Referring Physician (Pulmonary Disease)   Name of the patient: Natalie Perry  [id number]  December 17, 1960   Date of visit: 11/17/21  Diagnosis- Lung Cancer  Chief complaint/Reason for visit- Initial Meeting for [facility], preparing for starting chemotherapy   Heme/Onc history:  Oncology History  Lung cancer, lower lobe ([place])  09/28/2021 Cancer Staging   Staging form: Lung, AJCC 8th Edition - Pathologic stage from 09/28/2021: Stage IIB (pT1c, pN1, cM0) - Signed by [name], MD on 10/04/2021 Histopathologic type: Adenocarcinoma, NOS Stage prefix: Initial diagnosis   10/04/2021 Initial Diagnosis   Lung cancer, lower lobe ([place])   11/30/2021 -  Chemotherapy   Patient is on Treatment Plan : LUNG NSCLC Pemetrexed + Cisplatin q21d x 4 cycles       Interval history-  Patient presents to chemo care clinic today for initial meeting in preparation for starting chemotherapy. I introduced the chemo care clinic and we discussed that the role of the clinic is to assist those who are at an increased risk of emergency room visits and/or complications during the course of chemotherapy treatment. We discussed that the increased risk takes into account factors such as age, performance status, and co-morbidities. We also discussed that for some, this might include barriers to care such as not having a primary care provider, lack of insurance/transportation, or not being able to afford medications. We discussed that the goal of the program is to help prevent unplanned ER visits and help reduce complications during chemotherapy. We do this by discussing specific risk factors to each individual and identifying ways that we can help improve these risk  factors and reduce barriers to care.   Allergies  Allergen Reactions   Penicillins Hives, Swelling, Rash and Other (See Comments)   Nicotine Rash and Other (See Comments)    Skin turns red/rash    Past Medical History:  Diagnosis Date   Anxiety    Arthritis    Asthma    Bipolar affective disorder ([place])    Cirrhosis of liver due to hepatitis C    Hepatitis    Hypertension    Hypothyroidism    Pulmonary embolus ([place]) 10/2020   Thyroid disease     Past Surgical History:  Procedure Laterality Date   ABDOMINAL HYSTERECTOMY     ABDOMINAL SURGERY     APPENDECTOMY     CHOLECYSTECTOMY     INTERCOSTAL NERVE BLOCK Left 08/31/2021   Procedure: INTERCOSTAL NERVE BLOCK;  Surgeon: [name], MD;  Location: [place];  Service: Thoracic;  Laterality: Left;   NODE DISSECTION Left 08/31/2021   Procedure: NODE DISSECTION;  Surgeon: [name], MD;  Location: [place];  Service: Thoracic;  Laterality: Left;   XI ROBOTIC ASSISTED THORACOSCOPY- SEGMENTECTOMY Left 08/31/2021   Procedure: XI ROBOTIC ASSISTED THORACOSCOPY- LEFT LOWER LOBE LOBECTOMY;  Surgeon: [name], MD;  Location: [facility];  Service: Thoracic;  Laterality: Left;    Social History   Socioeconomic History   Marital status: Divorced    Spouse name: Not on file   Number of children: 2   Years of education: Not on file   Highest education level: Not on file  Occupational History   Occupation: disabled  Tobacco Use   Smoking status: Never  Smokeless tobacco: Never  Vaping Use   Vaping Use: Never used  Substance and Sexual Activity   Alcohol use: No   Drug use: Never   Sexual activity: Not on file  Other Topics Concern   Not on file  Social History Narrative   Not on file   Social Determinants of Health   Financial Resource Strain: Not on file  Food Insecurity: No Food Insecurity (05/22/2020)   Hunger Vital Sign    Worried About Running Out of Food in the Last Year: Never true    Ran Out  of Food in the Last Year: Never true  Transportation Needs: No Transportation Needs (05/22/2020)   PRAPARE - Hydrologist (Medical): No    Lack of Transportation (Non-Medical): No  Physical Activity: Insufficiently Active (07/23/2021)   Exercise Vital Sign    Days of Exercise per Week: 2 days    Minutes of Exercise per Session: 30 min  Stress: Not on file  Social Connections: Not on file  Intimate Partner Violence: Not on file    Family History  Problem Relation Age of Onset   Asthma Mother    Bipolar disorder Mother    CAD Father    Hypertension Father      Current Outpatient Medications:    acetaminophen (TYLENOL) 500 MG tablet, Take 500 mg by mouth every 6 (six) hours as needed (pain.)., Disp: , Rfl:    albuterol (PROVENTIL) (2.5 MG/3ML) 0.083% nebulizer solution, INHALE 1 VIAL VIA NEBULIZER 3 TO 4 TIMES A DAY AS DIRECTED, Disp: 300 mL, Rfl: 3   amLODipine (NORVASC) 5 MG tablet, TAKE 1 TABLET BY MOUTH EVERY DAY, Disp: 30 tablet, Rfl: 2   apixaban (ELIQUIS) 5 MG TABS tablet, Take 1 tablet by mouth twice daily, Disp: 60 tablet, Rfl: 2   betamethasone dipropionate (DIPROLENE) 0.05 % ointment, Apply 1 application. topically daily as needed (skin irritation.)., Disp: , Rfl:    levofloxacin (LEVAQUIN) 750 MG tablet, Take 1 tablet (750 mg total) by mouth daily., Disp: 10 tablet, Rfl: 0   levothyroxine (SYNTHROID) 100 MCG tablet, TAKE ONE TABLET BY MOUTH EVERY MORNING, Disp: 90 tablet, Rfl: 0   LORazepam (ATIVAN) 1 MG tablet, TAKE 1 TABLET(1 MG) BY MOUTH THREE TIMES DAILY AS NEEDED FOR ANXIETY, Disp: 90 tablet, Rfl: 0   losartan-hydrochlorothiazide (HYZAAR) 100-25 MG tablet, Take 1 tablet by mouth daily., Disp: 90 tablet, Rfl: 0   oxyCODONE (OXY IR/ROXICODONE) 5 MG immediate release tablet, Take 1 tablet (5 mg total) by mouth 3 (three) times daily as needed., Disp: 15 tablet, Rfl: 0   Polyethyl Glycol-Propyl Glycol (LUBRICANT EYE DROPS) 0.4-0.3 % SOLN, Place 1-2  drops into both eyes 3 (three) times daily as needed (dry/irritated eyes.)., Disp: , Rfl:    potassium chloride SA (KLOR-CON M) 20 MEQ tablet, Take 2 tablets (40 mEq total) by mouth 2 (two) times daily., Disp: 360 tablet, Rfl: 0   pregabalin (LYRICA) 50 MG capsule, Take 1 capsule (50 mg total) by mouth 2 (two) times daily., Disp: 60 capsule, Rfl: 1   pregabalin (LYRICA) 50 MG capsule, Take 1 capsule (50 mg total) by mouth 2 (two) times daily., Disp: 60 capsule, Rfl: 1   promethazine (PHENERGAN) 25 MG tablet, Take 1 tablet (25 mg total) by mouth every 8 (eight) hours as needed for nausea or vomiting., Disp: 30 tablet, Rfl: 2   SYMBICORT 160-4.5 MCG/ACT inhaler, Inhale 2 puffs into the lungs 2 (two) times daily. (Patient taking differently:  Inhale 1 puff into the lungs 2 (two) times daily.), Disp: 1 each, Rfl: 12   Vitamin D, Ergocalciferol, (DRISDOL) 1.25 MG (50000 UNIT) CAPS capsule, TAKE 1 CAPSULE BY MOUTH EVERY 7 DAYS (Patient taking differently: Take 50,000 Units by mouth every Wednesday.), Disp: 12 capsule, Rfl: 1     Latest Ref Rng & Units 09/28/2021   12:00 AM  CMP  BUN 4 - 21 11      Creatinine 0.5 - 1.1 0.5      Sodium 137 - 147 136      Potassium 3.5 - 5.1 mEq/L 3.4      Chloride 99 - 108 101      CO2 13 - 22 24      Calcium 8.7 - 10.7 8.7      Alkaline Phos 25 - 125 71      AST 13 - 35 36      ALT 7 - 35 U/L 26         This result is from an external source.      Latest Ref Rng & Units 09/28/2021   12:00 AM  CBC  WBC  13.6      Hemoglobin 12.0 - 16.0 11.3      Hematocrit 36 - 46 36      Platelets 150 - 400 K/uL 140         This result is from an external source.    No images are attached to the encounter.  No results found.   Assessment and plan- Patient is a 61 y.o. female who presents to [facility] for initial meeting in preparation for starting chemotherapy for the treatment of lung cancer.   Chemo Care Clinic/High Risk for ER/Hospitalization during  chemotherapy- We discussed the role of the chemo care clinic and identified patient specific risk factors. I discussed that patient was identified as high risk primarily based on:  Patient has past medical history positive for: Past Medical History:  Diagnosis Date   Anxiety    Arthritis    Asthma    Bipolar affective disorder ([place])    Cirrhosis of liver due to hepatitis C    Hepatitis    Hypertension    Hypothyroidism    Pulmonary embolus ([place]) 10/2020   Thyroid disease     Patient has past surgical history positive for: Past Surgical History:  Procedure Laterality Date   ABDOMINAL HYSTERECTOMY     ABDOMINAL SURGERY     APPENDECTOMY     CHOLECYSTECTOMY     INTERCOSTAL NERVE BLOCK Left 08/31/2021   Procedure: INTERCOSTAL NERVE BLOCK;  Surgeon: [name], MD;  Location: [place];  Service: Thoracic;  Laterality: Left;   NODE DISSECTION Left 08/31/2021   Procedure: NODE DISSECTION;  Surgeon: [name], MD;  Location: [place];  Service: Thoracic;  Laterality: Left;   XI ROBOTIC ASSISTED THORACOSCOPY- SEGMENTECTOMY Left 08/31/2021   Procedure: XI ROBOTIC ASSISTED THORACOSCOPY- LEFT LOWER LOBE LOBECTOMY;  Surgeon: [name], MD;  Location: [place];  Service: Thoracic;  Laterality: Left;    Provided general information including the following: 1.  Date of education: 11/17/2021 2.  Physician name: Dr. Bobby Rumpf 3.  Diagnosis: Lung Cancer 4.  Stage: Stage IIB 5.  Curative  6.  Chemotherapy plan including drugs and how often: Cisplatin, Pemetrexed 7.  Start date: Pending authorization 8.  Other referrals: None at this time 9.  The patient is to call our office with any questions or  concerns.  Our office number [phone], if after hours or on the weekend, call the same number and wait for the answering service.  There is always an oncologist on call 10.  Medications prescribed: Ondansetron, Prochlorperazine 11.  The patient has verbalized understanding of the  treatment plan and has no barriers to adherence or understanding.  Obtained signed consent from patient.  Discussed symptoms including 1.  Low blood counts including red blood cells, white blood cells and platelets. 2. Infection including to avoid large crowds, wash hands frequently, and stay away from people who were sick.  If fever develops of 100.4 or higher, call our office. 3.  Mucositis-given instructions on mouth rinse (baking soda and salt mixture).  Keep mouth clean.  Use soft bristle toothbrush.  If mouth sores develop, call our clinic. 4.  Nausea/vomiting-gave prescriptions for ondansetron 4 mg every 4 hours as needed for nausea, may take around the clock if persistent.  Compazine 10 mg every 6 hours, may take around the clock if persistent. 5.  Diarrhea-use over-the-counter Imodium.  Call clinic if not controlled. 6.  Constipation-use senna, 1 to 2 tablets twice a day.  If no BM in 2 to 3 days call the clinic. 7.  Loss of appetite-try to eat small meals every 2-3 hours.  Call clinic if not eating. 8.  Taste changes-zinc 500 mg daily.  If becomes severe call clinic. 9.  Alcoholic beverages. 10.  Drink 2 to 3 quarts of water per day. 11.  Peripheral neuropathy-patient to call if numbness or tingling in hands or feet is persistent  Neulasta-will be given 24 to 48 hours after chemotherapy.  Gave information sheet on bone and joint pain.  Use Claritin or Pepcid.  May use ibuprofen or Aleve.  Call if symptoms persist or are unbearable.  Gave information on the supportive care team and how to contact them regarding services.  Discussed advanced directives.  The patient does not have their advanced directives but will look at the copy provided in their notebook and will call with any questions. Spiritual [employer] Financial Social worker Advanced directives  Answered questions to patient satisfaction.  Patient is to call with any further questions or concerns.  The medication  prescribed to the patient will be printed out from [place] references This will give the following information: Name of your medication Approved uses Dose and schedule Storage and handling Handling body fluids and waste Drug and food interactions Possible side effects and management Pregnancy, sexual activity, and contraception Obtaining medication   We discussed that social determinants of health may have significant impacts on health and outcomes for cancer patients.  Today we discussed specific social determinants of performance status, alcohol use, depression, financial needs, food insecurity, housing, interpersonal violence, social connections, stress, tobacco use, and transportation.    After lengthy discussion the following were identified as areas of need:   Outpatient services: We discussed options including home based and outpatient services, DME and care program. We discusssed that patients who participate in regular physical activity report fewer negative impacts of cancer and treatments and report less fatigue.   Financial Concerns: We discussed that living with cancer can create tremendous financial burden.  We discussed options for assistance. I asked that if assistance is needed in affording medications or paying bills to please let us know so that we can provide assistance. We discussed options for food including social services and onsite food pantry.  We will also notify Mort Sawyers to see if cancer center  can provide additional support.  Referral to Social work: Introduced Education officer, museum Mort Sawyers and the services she can provide such as support with utility bill, cell phone and gas vouchers.   Support groups: We discussed options for support groups at the cancer center. If interested, please notify nurse navigator to enroll. We discussed options for managing stress including healthy eating, exercise as well as participating in no charge counseling services at the cancer  center and support groups.  If these are of interest, patient can notify either myself or primary nursing team.We discussed options for management including medications and referral to quit Smart program  Transportation: We discussed options for transportation.  I have notified primary oncology team who will help assist with arranging Lucianne Lei transportation for appointments when/if needed. We also discussed options for transportation on short notice/acute visits.  Palliative care services: We have palliative care services available in the cancer center to discuss goals of care and advanced care planning.  Please let us know if you have any questions or would like to speak to our palliative nurse practitioner.  Symptom Management Clinic: We discussed our symptom management clinic which is available for acute concerns while receiving treatment such as nausea, vomiting or diarrhea.  We can be reached via telephone at [phone] or through my chart.  We are available for virtual or in person visits on the same day from 830 to 4 PM Monday through Friday. She denies needing specific assistance at this time and She will be followed by Dr. Bobby Rumpf clinical team.  Plan: Discussed symptom management clinic. Discussed palliative care services. Discussed resources that are available here at the cancer center. Discussed medications and new prescriptions to begin treatment such as anti-nausea or steroids.   Disposition: RTC on   Visit Diagnosis No diagnosis found.  Patient expressed understanding and was in agreement with this plan. She also understands that She can call clinic at any time with any questions, concerns, or complaints.   I provided 30 minutes of  face to face  during this encounter, and > 50% was spent counseling as documented under my assessment & plan.   Dayton Scrape, FNP- [facility]

## 2021-11-20 ENCOUNTER — Telehealth: Payer: Self-pay

## 2021-11-20 NOTE — Chronic Care Management (AMB) (Signed)
Chronic Care Management Pharmacy Assistant   Name: Natalie Perry  MRN: [id number] DOB: 21-Jun-1960  Reason for Encounter: Disease State/ Hypertension  Recent office visits:  None  Recent consult visits:  11-17-2021 [name], MD (Cardiothoracic Surgery). Follow up visit no changes.  11-17-2021 Quentin Mulling, RN (Oncology). B12 injection given.  11-17-2021 Melodye Ped, NP (Oncology). START folic acid 1 mg daily, zofran 4 mg every 4 hours PRN and compazine 10 mg every 6 hours PRN.  11-16-2021 [name], MD (Oncology). Chemotherapy.  10-12-2021 [name], MD (Oncology). Chemotherapy.  Hospital visits:  None in previous 6 months  Medications: Outpatient Encounter Medications as of 11/20/2021  Medication Sig   acetaminophen (TYLENOL) 500 MG tablet Take 500 mg by mouth every 6 (six) hours as needed (pain.).   albuterol (PROVENTIL) (2.5 MG/3ML) 0.083% nebulizer solution INHALE 1 VIAL VIA NEBULIZER 3 TO 4 TIMES A DAY AS DIRECTED   albuterol (VENTOLIN HFA) 108 (90 Base) MCG/ACT inhaler SMARTSIG:1-2 Puff(s) Via Inhaler Every 4-6 Hours PRN   amLODipine (NORVASC) 5 MG tablet TAKE 1 TABLET BY MOUTH EVERY DAY   apixaban (ELIQUIS) 5 MG TABS tablet Take 1 tablet by mouth twice daily   betamethasone dipropionate (DIPROLENE) 0.05 % ointment Apply 1 application. topically daily as needed (skin irritation.).   folic acid (FOLVITE) 1 MG tablet Take 1 tablet (1 mg total) by mouth daily.   levofloxacin (LEVAQUIN) 750 MG tablet Take 1 tablet (750 mg total) by mouth daily.   levothyroxine (SYNTHROID) 100 MCG tablet TAKE ONE TABLET BY MOUTH EVERY MORNING   LORazepam (ATIVAN) 1 MG tablet TAKE 1 TABLET(1 MG) BY MOUTH THREE TIMES DAILY AS NEEDED FOR ANXIETY   losartan-hydrochlorothiazide (HYZAAR) 100-25 MG tablet Take 1 tablet by mouth daily.   ondansetron (ZOFRAN) 4 MG tablet Take 1 tablet (4 mg total) by mouth every 4 (four) hours as needed for nausea.    oxyCODONE (OXY IR/ROXICODONE) 5 MG immediate release tablet Take 1 tablet (5 mg total) by mouth 3 (three) times daily as needed.   Polyethyl Glycol-Propyl Glycol (LUBRICANT EYE DROPS) 0.4-0.3 % SOLN Place 1-2 drops into both eyes 3 (three) times daily as needed (dry/irritated eyes.).   potassium chloride SA (KLOR-CON M) 20 MEQ tablet Take 2 tablets (40 mEq total) by mouth 2 (two) times daily.   pregabalin (LYRICA) 50 MG capsule Take 1 capsule (50 mg total) by mouth 2 (two) times daily.   prochlorperazine (COMPAZINE) 10 MG tablet Take 1 tablet (10 mg total) by mouth every 6 (six) hours as needed for nausea or vomiting.   promethazine (PHENERGAN) 25 MG tablet Take 1 tablet (25 mg total) by mouth every 8 (eight) hours as needed for nausea or vomiting.   SYMBICORT 160-4.5 MCG/ACT inhaler Inhale 2 puffs into the lungs 2 (two) times daily. (Patient taking differently: Inhale 1 puff into the lungs 2 (two) times daily.)   Vitamin D, Ergocalciferol, (DRISDOL) 1.25 MG (50000 UNIT) CAPS capsule TAKE 1 CAPSULE BY MOUTH EVERY 7 DAYS (Patient taking differently: Take 50,000 Units by mouth every Wednesday.)   No facility-administered encounter medications on file as of 11/20/2021.   Recent Office Vitals: BP Readings from Last 3 Encounters:  11/17/21 120/81  11/17/21 116/77  11/17/21 120/72   Pulse Readings from Last 3 Encounters:  11/17/21 90  11/17/21 (!) 109  11/17/21 (!) 133    Wt Readings from Last 3 Encounters:  11/17/21 186 lb (84.4 kg)  11/17/21 184 lb (83.5  kg)  11/17/21 184 lb 6.4 oz (83.6 kg)     Kidney Function Lab Results  Component Value Date/Time   CREATININE 0.5 09/28/2021 12:00 AM   CREATININE 0.64 09/04/2021 07:43 AM   CREATININE 0.69 09/02/2021 01:11 AM   CREATININE 0.65 12/13/2016 01:06 PM   CREATININE 0.72 07/05/2016 12:42 PM   GFRNONAA >60 09/04/2021 07:43 AM   GFRAA 105 04/23/2020 10:30 AM       Latest Ref Rng & Units 09/28/2021   12:00 AM 09/04/2021    7:43 AM  09/02/2021    1:11 AM  BMP  Glucose 70 - 99 mg/dL  131  141   BUN 4 - 21 11     10  10    Creatinine 0.5 - 1.1 0.5     0.64  0.69   Sodium 137 - 147 136     132  132   Potassium 3.5 - 5.1 mEq/L 3.4     3.1  3.8   Chloride 99 - 108 101     100  103   CO2 13 - 22 24     25  25    Calcium 8.7 - 10.7 8.7     8.0  8.0      This result is from an external source.     Current antihypertensive regimen:  Amlodipine 5 mg daily Hyzaar 100-25 mg daily  Patient verbally confirms she is taking the above medications as directed. Yes  How often are you checking your Blood Pressure? daily  she checks her blood pressure in the morning after taking her medication.  Current home BP readings: 122/68 88, 120/70 90 Wrist or arm cuff: wrist Caffeine intake: Limited Salt intake: Limited OTC medications including pseudoephedrine or NSAIDs?  Iron and vitamin D  Any readings above 180/120? No   What recent interventions/DTPs have been made by any provider to improve Blood Pressure control since last CPP Visit:  None  Any recent hospitalizations or ED visits since last visit with CPP? No  What diet changes have been made to improve Blood Pressure Control?  Patient states she limits her salt intake and drinks plenty of water.  What exercise is being done to improve your Blood Pressure Control?  Patient goes to physical therapy.  Adherence Review: Is the patient currently on ACE/ARB medication? Yes Does the patient have >5 day gap between last estimated fill dates? CPP to review   Care Gaps: Last annual wellness visit? 07-22-2021   Star Rating Drugs: Hyzaar 100-25 mg- Last filled 10-13-2021 90 DS  [facility] Clinical Pharmacist Assistant [phone]

## 2021-11-20 NOTE — Progress Notes (Signed)
Sent in request for DOS 11/26/2021 to [employer].

## 2021-11-23 ENCOUNTER — Other Ambulatory Visit: Payer: Self-pay | Admitting: Pharmacist

## 2021-11-23 DIAGNOSIS — C3432 Malignant neoplasm of lower lobe, left bronchus or lung: Secondary | ICD-10-CM | POA: Diagnosis not present

## 2021-11-24 ENCOUNTER — Telehealth: Payer: Self-pay

## 2021-11-24 NOTE — Telephone Encounter (Signed)
Natalie Perry, pharmacist @ [facility] LVM on nurse line req information on pt's reason for Zofran prescription. Is pt receiving chemotherapy or radiation? Is the need for postop issue?  I called SarahBeth and told her that pt is scheduled to start chemo on 11/30/2021 (Cisplatin, Alimta). She thanked me for quick return call and she would make notation on PA.Marland Kitchen

## 2021-11-25 DIAGNOSIS — J45998 Other asthma: Secondary | ICD-10-CM | POA: Diagnosis not present

## 2021-11-26 ENCOUNTER — Inpatient Hospital Stay: Payer: PPO

## 2021-11-26 DIAGNOSIS — C3432 Malignant neoplasm of lower lobe, left bronchus or lung: Secondary | ICD-10-CM

## 2021-11-26 DIAGNOSIS — D649 Anemia, unspecified: Secondary | ICD-10-CM | POA: Diagnosis not present

## 2021-11-26 LAB — HEPATIC FUNCTION PANEL
ALT: 24 U/L (ref 7–35)
AST: 38 — AB (ref 13–35)
Alkaline Phosphatase: 62 (ref 25–125)
Bilirubin, Total: 1

## 2021-11-26 LAB — BASIC METABOLIC PANEL
BUN: 11 (ref 4–21)
CO2: 24 — AB (ref 13–22)
Chloride: 105 (ref 99–108)
Creatinine: 0.5 (ref 0.5–1.1)
Glucose: 113
Potassium: 3.8 mEq/L (ref 3.5–5.1)
Sodium: 137 (ref 137–147)

## 2021-11-26 LAB — CBC AND DIFFERENTIAL
HCT: 37 (ref 36–46)
Hemoglobin: 12.4 (ref 12.0–16.0)
Neutrophils Absolute: 5.09
Platelets: 128 10*3/uL — AB (ref 150–400)
WBC: 6.7

## 2021-11-26 LAB — CBC: RBC: 4.41 (ref 3.87–5.11)

## 2021-11-26 LAB — COMPREHENSIVE METABOLIC PANEL
Albumin: 4 (ref 3.5–5.0)
Calcium: 9.1 (ref 8.7–10.7)

## 2021-11-26 LAB — MAGNESIUM: Magnesium: 1.9

## 2021-11-27 ENCOUNTER — Encounter: Payer: Self-pay | Admitting: Oncology

## 2021-11-27 DIAGNOSIS — C3432 Malignant neoplasm of lower lobe, left bronchus or lung: Secondary | ICD-10-CM | POA: Diagnosis not present

## 2021-11-27 DIAGNOSIS — J449 Chronic obstructive pulmonary disease, unspecified: Secondary | ICD-10-CM | POA: Diagnosis not present

## 2021-11-27 DIAGNOSIS — Z87891 Personal history of nicotine dependence: Secondary | ICD-10-CM | POA: Diagnosis not present

## 2021-11-27 DIAGNOSIS — J9811 Atelectasis: Secondary | ICD-10-CM | POA: Diagnosis not present

## 2021-11-27 DIAGNOSIS — Z452 Encounter for adjustment and management of vascular access device: Secondary | ICD-10-CM | POA: Diagnosis not present

## 2021-11-27 MED FILL — Fosaprepitant Dimeglumine For IV Infusion 150 MG (Base Eq): INTRAVENOUS | Qty: 5 | Status: AC

## 2021-11-27 MED FILL — Cisplatin Inj 100 MG/100ML (1 MG/ML): INTRAVENOUS | Qty: 144 | Status: AC

## 2021-11-27 MED FILL — Pemetrexed Disodium For IV Soln 500 MG (Base Equiv): INTRAVENOUS | Qty: 40 | Status: AC

## 2021-11-27 MED FILL — Dexamethasone Sodium Phosphate Inj 100 MG/10ML: INTRAMUSCULAR | Qty: 1 | Status: AC

## 2021-11-30 ENCOUNTER — Other Ambulatory Visit: Payer: Self-pay

## 2021-11-30 ENCOUNTER — Inpatient Hospital Stay: Payer: PPO

## 2021-11-30 VITALS — BP 131/71 | HR 98 | Temp 98.0°F | Resp 20 | Ht 61.0 in | Wt 187.0 lb

## 2021-11-30 DIAGNOSIS — Z5111 Encounter for antineoplastic chemotherapy: Secondary | ICD-10-CM | POA: Diagnosis not present

## 2021-11-30 DIAGNOSIS — C3432 Malignant neoplasm of lower lobe, left bronchus or lung: Secondary | ICD-10-CM

## 2021-11-30 MED ORDER — SODIUM CHLORIDE 0.9% FLUSH
10.0000 mL | INTRAVENOUS | Status: DC | PRN
  Administered 2021-11-30: 10 mL

## 2021-11-30 MED ORDER — SODIUM CHLORIDE 0.9 % IV SOLN
150.0000 mg | Freq: Once | INTRAVENOUS | Status: AC
  Administered 2021-11-30: 150 mg via INTRAVENOUS
  Filled 2021-11-30: qty 150

## 2021-11-30 MED ORDER — SODIUM CHLORIDE 0.9 % IV SOLN: Freq: Once | INTRAVENOUS | Status: AC

## 2021-11-30 MED ORDER — MAGNESIUM SULFATE 2 GM/50ML IV SOLN
2.0000 g | Freq: Once | INTRAVENOUS | Status: AC
  Administered 2021-11-30: 2 g via INTRAVENOUS
  Filled 2021-11-30: qty 50

## 2021-11-30 MED ORDER — HEPARIN SOD (PORK) LOCK FLUSH 100 UNIT/ML IV SOLN
500.0000 [IU] | Freq: Once | INTRAVENOUS | Status: AC | PRN
  Administered 2021-11-30: 500 [IU]

## 2021-11-30 MED ORDER — SODIUM CHLORIDE 0.9 % IV SOLN
10.0000 mg | Freq: Once | INTRAVENOUS | Status: AC
  Administered 2021-11-30: 10 mg via INTRAVENOUS
  Filled 2021-11-30: qty 10

## 2021-11-30 MED ORDER — SODIUM CHLORIDE 0.9 % IV SOLN
500.0000 mg/m2 | Freq: Once | INTRAVENOUS | Status: AC
  Administered 2021-11-30: 1000 mg via INTRAVENOUS
  Filled 2021-11-30: qty 40

## 2021-11-30 MED ORDER — POTASSIUM CHLORIDE IN NACL 20-0.9 MEQ/L-% IV SOLN
Freq: Once | INTRAVENOUS | Status: AC
  Filled 2021-11-30: qty 1000

## 2021-11-30 MED ORDER — PALONOSETRON HCL INJECTION 0.25 MG/5ML
0.2500 mg | Freq: Once | INTRAVENOUS | Status: AC
  Administered 2021-11-30: 0.25 mg via INTRAVENOUS
  Filled 2021-11-30: qty 5

## 2021-11-30 MED ORDER — SODIUM CHLORIDE 0.9 % IV SOLN
75.0000 mg/m2 | Freq: Once | INTRAVENOUS | Status: AC
  Administered 2021-11-30: 144 mg via INTRAVENOUS
  Filled 2021-11-30: qty 144

## 2021-11-30 NOTE — Patient Instructions (Signed)
Pemetrexed injection What is this medication? PEMETREXED (PEM e TREX ed) is a chemotherapy drug used to treat lung cancers like non-small cell lung cancer and mesothelioma. It may also be used to treat other cancers. This medicine may be used for other purposes; ask your health care provider or pharmacist if you have questions. COMMON BRAND NAME(S): Alimta, PEMFEXY What should I tell my care team before I take this medication? They need to know if you have any of these conditions: infection (especially a virus infection such as chickenpox, cold sores, or herpes) kidney disease low blood counts, like low white cell, platelet, or red cell counts lung or breathing disease, like asthma radiation therapy an unusual or allergic reaction to pemetrexed, other medicines, foods, dyes, or preservative pregnant or trying to get pregnant breast-feeding How should I use this medication? This drug is given as an infusion into a vein. It is administered in a hospital or clinic by a specially trained health care professional. Talk to your pediatrician regarding the use of this medicine in children. Special care may be needed. Overdosage: If you think you have taken too much of this medicine contact a poison control center or emergency room at once. NOTE: This medicine is only for you. Do not share this medicine with others. What if I miss a dose? It is important not to miss your dose. Call your doctor or health care professional if you are unable to keep an appointment. What may interact with this medication? This medicine may interact with the following medications: Ibuprofen This list may not describe all possible interactions. Give your health care provider a list of all the medicines, herbs, non-prescription drugs, or dietary supplements you use. Also tell them if you smoke, drink alcohol, or use illegal drugs. Some items may interact with your medicine. What should I watch for while using this  medication? Visit your doctor for checks on your progress. This drug may make you feel generally unwell. This is not uncommon, as chemotherapy can affect healthy cells as well as cancer cells. Report any side effects. Continue your course of treatment even though you feel ill unless your doctor tells you to stop. In some cases, you may be given additional medicines to help with side effects. Follow all directions for their use. Call your doctor or health care professional for advice if you get a fever, chills or sore throat, or other symptoms of a cold or flu. Do not treat yourself. This drug decreases your body's ability to fight infections. Try to avoid being around people who are sick. This medicine may increase your risk to bruise or bleed. Call your doctor or health care professional if you notice any unusual bleeding. Be careful brushing and flossing your teeth or using a toothpick because you may get an infection or bleed more easily. If you have any dental work done, tell your dentist you are receiving this medicine. Avoid taking products that contain aspirin, acetaminophen, ibuprofen, naproxen, or ketoprofen unless instructed by your doctor. These medicines may hide a fever. Call your doctor or health care professional if you get diarrhea or mouth sores. Do not treat yourself. To protect your kidneys, drink water or other fluids as directed while you are taking this medicine. Do not become pregnant while taking this medicine or for 6 months after stopping it. Women should inform their doctor if they wish to become pregnant or think they might be pregnant. Men should not father a child while taking this medicine and  for 3 months after stopping it. This may interfere with the ability to father a child. You should talk to your doctor or health care professional if you are concerned about your fertility. There is a potential for serious side effects to an unborn child. Talk to your health care  professional or pharmacist for more information. Do not breast-feed an infant while taking this medicine or for 1 week after stopping it. What side effects may I notice from receiving this medication? Side effects that you should report to your doctor or health care professional as soon as possible: allergic reactions like skin rash, itching or hives, swelling of the face, lips, or tongue breathing problems redness, blistering, peeling or loosening of the skin, including inside the mouth signs and symptoms of bleeding such as bloody or black, tarry stools; red or dark-brown urine; spitting up blood or brown material that looks like coffee grounds; red spots on the skin; unusual bruising or bleeding from the eye, gums, or nose signs and symptoms of infection like fever or chills; cough; sore throat; pain or trouble passing urine signs and symptoms of kidney injury like trouble passing urine or change in the amount of urine signs and symptoms of liver injury like dark yellow or brown urine; general ill feeling or flu-like symptoms; light-colored stools; loss of appetite; nausea; right upper belly pain; unusually weak or tired; yellowing of the eyes or skin Side effects that usually do not require medical attention (report to your doctor or health care professional if they continue or are bothersome): constipation mouth sores nausea, vomiting unusually weak or tired This list may not describe all possible side effects. Call your doctor for medical advice about side effects. You may report side effects to FDA at 1-800-FDA-1088. Where should I keep my medication? This drug is given in a hospital or clinic and will not be stored at home. NOTE: This sheet is a summary. It may not cover all possible information. If you have questions about this medicine, talk to your doctor, pharmacist, or health care provider.  2023 Elsevier/Gold Standard (2017-06-21 00:00:00) Cisplatin injection What is this  medication? CISPLATIN (SIS pla tin) is a chemotherapy drug. It targets fast dividing cells, like cancer cells, and causes these cells to die. This medicine is used to treat many types of cancer like bladder, ovarian, and testicular cancers. This medicine may be used for other purposes; ask your health care provider or pharmacist if you have questions. COMMON BRAND NAME(S): Platinol, Platinol -AQ What should I tell my care team before I take this medication? They need to know if you have any of these conditions: eye disease, vision problems hearing problems kidney disease low blood counts, like white cells, platelets, or red blood cells tingling of the fingers or toes, or other nerve disorder an unusual or allergic reaction to cisplatin, carboplatin, oxaliplatin, other medicines, foods, dyes, or preservatives pregnant or trying to get pregnant breast-feeding How should I use this medication? This drug is given as an infusion into a vein. It is administered in a hospital or clinic by a specially trained health care professional. Talk to your pediatrician regarding the use of this medicine in children. Special care may be needed. Overdosage: If you think you have taken too much of this medicine contact a poison control center or emergency room at once. NOTE: This medicine is only for you. Do not share this medicine with others. What if I miss a dose? It is important not to miss a  dose. Call your doctor or health care professional if you are unable to keep an appointment. What may interact with this medication? This medicine may interact with the following medications: foscarnet certain antibiotics like amikacin, gentamicin, neomycin, polymyxin B, streptomycin, tobramycin, vancomycin This list may not describe all possible interactions. Give your health care provider a list of all the medicines, herbs, non-prescription drugs, or dietary supplements you use. Also tell them if you smoke, drink  alcohol, or use illegal drugs. Some items may interact with your medicine. What should I watch for while using this medication? Your condition will be monitored carefully while you are receiving this medicine. You will need important blood work done while you are taking this medicine. This drug may make you feel generally unwell. This is not uncommon, as chemotherapy can affect healthy cells as well as cancer cells. Report any side effects. Continue your course of treatment even though you feel ill unless your doctor tells you to stop. This medicine may increase your risk of getting an infection. Call your healthcare professional for advice if you get a fever, chills, or sore throat, or other symptoms of a cold or flu. Do not treat yourself. Try to avoid being around people who are sick. Avoid taking medicines that contain aspirin, acetaminophen, ibuprofen, naproxen, or ketoprofen unless instructed by your healthcare professional. These medicines may hide a fever. This medicine may increase your risk to bruise or bleed. Call your doctor or health care professional if you notice any unusual bleeding. Be careful brushing and flossing your teeth or using a toothpick because you may get an infection or bleed more easily. If you have any dental work done, tell your dentist you are receiving this medicine. Do not become pregnant while taking this medicine or for 14 months after stopping it. Women should inform their healthcare professional if they wish to become pregnant or think they might be pregnant. Men should not father a child while taking this medicine and for 11 months after stopping it. There is potential for serious side effects to an unborn child. Talk to your healthcare professional for more information. Do not breast-feed an infant while taking this medicine. This medicine has caused ovarian failure in some women. This medicine may make it more difficult to get pregnant. Talk to your healthcare  professional if you are concerned about your fertility. This medicine has caused decreased sperm counts in some men. This may make it more difficult to father a child. Talk to your healthcare professional if you are concerned about your fertility. Drink fluids as directed while you are taking this medicine. This will help protect your kidneys. Call your doctor or health care professional if you get diarrhea. Do not treat yourself. What side effects may I notice from receiving this medication? Side effects that you should report to your doctor or health care professional as soon as possible: allergic reactions like skin rash, itching or hives, swelling of the face, lips, or tongue blurred vision changes in vision decreased hearing or ringing of the ears nausea, vomiting pain, redness, or irritation at site where injected pain, tingling, numbness in the hands or feet signs and symptoms of bleeding such as bloody or black, tarry stools; red or dark brown urine; spitting up blood or brown material that looks like coffee grounds; red spots on the skin; unusual bruising or bleeding from the eyes, gums, or nose signs and symptoms of infection like fever; chills; cough; sore throat; pain or trouble passing urine signs  and symptoms of kidney injury like trouble passing urine or change in the amount of urine signs and symptoms of low red blood cells or anemia such as unusually weak or tired; feeling faint or lightheaded; falls; breathing problems Side effects that usually do not require medical attention (report to your doctor or health care professional if they continue or are bothersome): loss of appetite mouth sores muscle cramps This list may not describe all possible side effects. Call your doctor for medical advice about side effects. You may report side effects to FDA at 1-800-FDA-1088. Where should I keep my medication? This drug is given in a hospital or clinic and will not be stored at  home. NOTE: This sheet is a summary. It may not cover all possible information. If you have questions about this medicine, talk to your doctor, pharmacist, or health care provider.  2023 Elsevier/Gold Standard (2021-03-27 00:00:00)

## 2021-12-01 ENCOUNTER — Other Ambulatory Visit: Payer: Self-pay

## 2021-12-02 ENCOUNTER — Other Ambulatory Visit: Payer: Self-pay | Admitting: Hematology and Oncology

## 2021-12-02 MED ORDER — DEXAMETHASONE 4 MG PO TABS: ORAL_TABLET | ORAL | 0 refills | Status: DC

## 2021-12-08 ENCOUNTER — Other Ambulatory Visit: Payer: Self-pay

## 2021-12-08 DIAGNOSIS — C3432 Malignant neoplasm of lower lobe, left bronchus or lung: Secondary | ICD-10-CM | POA: Diagnosis not present

## 2021-12-10 ENCOUNTER — Other Ambulatory Visit: Payer: Self-pay | Admitting: Physician Assistant

## 2021-12-14 ENCOUNTER — Other Ambulatory Visit: Payer: Self-pay

## 2021-12-14 NOTE — Progress Notes (Signed)
Sent in request for DOS 12/17/2021 to [employer].

## 2021-12-16 NOTE — Progress Notes (Signed)
Natalie Perry  [address] [place],    [address] [phone]  Clinic Day:  11/16/2021  Referring physician: [name], PA-C  HISTORY OF PRESENT ILLNESS:  The patient is a 61 y.o. female will stage IIB (T1N1 M0) lung adenocarcinoma, status post a left lower lobe lobectomy in April 2023.  With respect to her left lower lobectomy in April 2023, her mass measured 2.1 cm with 1/17 lymph nodes being positive for disease.  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  She comes in today to be reassessed to determine if she is now healthy enough to undergo adjuvant chemotherapy.  Overall, the patient claims to be doing much better.  Although she still has a moderate degree of chest wall pain from her lung cancer surgery, she denies having any respiratory symptoms or other findings which concern her for early disease recurrence.   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  Labs dating back to 2011 have shown her platelet count consistently around 100K.  As she has not been severely thrombocytopenic, her platelets have been followed conservatively.    PHYSICAL EXAM:  There were no vitals taken for this visit. Wt Readings from Last 3 Encounters:  11/30/21 187 lb (84.8 kg)  11/17/21 186 lb (84.4 kg)  11/17/21 184 lb (83.5 kg)   There is no height or weight on file to calculate BMI. Performance status (ECOG): 1 - Symptomatic but completely ambulatory Physical Exam Constitutional:      Appearance: Normal appearance. She is not ill-appearing.     Comments: She looks much stronger versus previous visits.  She is ambulating on her own power.  HENT:     Mouth/Throat:     Mouth: Mucous membranes are moist.     Pharynx: Oropharynx is clear. No oropharyngeal exudate or posterior oropharyngeal erythema.  Cardiovascular:     Rate and  Rhythm: Normal rate and regular rhythm.     Heart sounds: No murmur heard.    No friction rub. No gallop.  Pulmonary:     Effort: Pulmonary effort is normal. No respiratory distress.     Breath sounds: Normal breath sounds. No wheezing, rhonchi or rales.  Abdominal:     General: Bowel sounds are normal. There is no distension.     Palpations: Abdomen is soft. There is no mass.     Tenderness: There is no abdominal tenderness.  Musculoskeletal:        General: No swelling.     Right lower leg: No edema.     Left lower leg: No edema.  Lymphadenopathy:     Cervical: No cervical adenopathy.     Upper Body:     Right upper body: No supraclavicular or axillary adenopathy.     Left upper body: No supraclavicular or axillary adenopathy.     Lower Body: No right inguinal adenopathy. No left inguinal adenopathy.  Skin:    General: Skin is warm.     Coloration: Skin is not jaundiced.     Findings: No erythema, lesion or rash.     Comments:    Neurological:     General: No focal deficit present.     Mental Status: She is alert and oriented to person, place, and time. Mental status is at baseline.  Psychiatric:        Mood and Affect: Mood normal.  Behavior: Behavior normal.        Thought Content: Thought content normal.   ASSESSMENT & PLAN:  Assessment/Plan:  A 61 y.o. female with stage IIB (T1 N1 M0) lung adenocarcinoma, status post a left lower lobe lobectomy in April 2023.  As the patient clearly looks much better today, she appears to be ready to begin adjuvant chemotherapy.  Based upon her adenocarcinoma histology, I will give her 4 cycles of adjuvant cisplatin/pemetrexed.  The patient understands the goal of chemotherapy is to ultimately cure her of disease by eradicating any microscopic cells that still may be present.  She was made aware of the side effects which go along this chemotherapy regimen, including nausea, vomiting, cytopenias, fatigue, and vitamin deficiencies.  I will  arrange for her to have a port placed through which her 4 cycles of adjuvant chemotherapy will be given.  Her first cycle of cisplatin/pemetrexed is scheduled for Monday, July 24th.  I will see her 3 weeks later before she heads into her second cycle of adjuvant chemotherapy.  The patient understands all the plans discussed today and is in agreement with them.  Bethany Cumming Macarthur Critchley, MD

## 2021-12-17 ENCOUNTER — Inpatient Hospital Stay: Payer: PPO | Admitting: Oncology

## 2021-12-17 ENCOUNTER — Inpatient Hospital Stay: Payer: PPO | Attending: Oncology

## 2021-12-17 ENCOUNTER — Other Ambulatory Visit: Payer: Self-pay | Admitting: Oncology

## 2021-12-17 VITALS — BP 142/86 | HR 123 | Temp 98.5°F | Resp 18 | Ht 61.0 in | Wt 187.4 lb

## 2021-12-17 DIAGNOSIS — C3432 Malignant neoplasm of lower lobe, left bronchus or lung: Secondary | ICD-10-CM | POA: Insufficient documentation

## 2021-12-17 DIAGNOSIS — D6959 Other secondary thrombocytopenia: Secondary | ICD-10-CM | POA: Insufficient documentation

## 2021-12-17 DIAGNOSIS — R000 Tachycardia, unspecified: Secondary | ICD-10-CM | POA: Insufficient documentation

## 2021-12-17 DIAGNOSIS — Z5111 Encounter for antineoplastic chemotherapy: Secondary | ICD-10-CM | POA: Insufficient documentation

## 2021-12-17 DIAGNOSIS — D649 Anemia, unspecified: Secondary | ICD-10-CM | POA: Diagnosis not present

## 2021-12-17 DIAGNOSIS — K7460 Unspecified cirrhosis of liver: Secondary | ICD-10-CM | POA: Insufficient documentation

## 2021-12-18 MED FILL — Dexamethasone Sodium Phosphate Inj 100 MG/10ML: INTRAMUSCULAR | Qty: 1 | Status: AC

## 2021-12-18 MED FILL — Fosaprepitant Dimeglumine For IV Infusion 150 MG (Base Eq): INTRAVENOUS | Qty: 5 | Status: AC

## 2021-12-18 MED FILL — Pemetrexed Disodium For IV Soln 500 MG (Base Equiv): INTRAVENOUS | Qty: 40 | Status: AC

## 2021-12-18 MED FILL — Cisplatin Inj 100 MG/100ML (1 MG/ML): INTRAVENOUS | Qty: 144 | Status: AC

## 2021-12-21 ENCOUNTER — Inpatient Hospital Stay: Payer: PPO

## 2021-12-21 VITALS — BP 137/82 | HR 93 | Temp 97.9°F | Resp 16 | Ht 61.0 in | Wt 187.0 lb

## 2021-12-21 DIAGNOSIS — C3432 Malignant neoplasm of lower lobe, left bronchus or lung: Secondary | ICD-10-CM | POA: Diagnosis not present

## 2021-12-21 DIAGNOSIS — K7460 Unspecified cirrhosis of liver: Secondary | ICD-10-CM | POA: Diagnosis not present

## 2021-12-21 DIAGNOSIS — R000 Tachycardia, unspecified: Secondary | ICD-10-CM | POA: Diagnosis not present

## 2021-12-21 DIAGNOSIS — D6959 Other secondary thrombocytopenia: Secondary | ICD-10-CM | POA: Diagnosis not present

## 2021-12-21 DIAGNOSIS — Z5111 Encounter for antineoplastic chemotherapy: Secondary | ICD-10-CM | POA: Diagnosis not present

## 2021-12-21 MED ORDER — PALONOSETRON HCL INJECTION 0.25 MG/5ML
0.2500 mg | Freq: Once | INTRAVENOUS | Status: AC
  Administered 2021-12-21: 0.25 mg via INTRAVENOUS

## 2021-12-21 MED ORDER — SODIUM CHLORIDE 0.9% FLUSH
10.0000 mL | INTRAVENOUS | Status: DC | PRN
  Administered 2021-12-21: 10 mL

## 2021-12-21 MED ORDER — SODIUM CHLORIDE 0.9 % IV SOLN
75.0000 mg/m2 | Freq: Once | INTRAVENOUS | Status: AC
  Administered 2021-12-21: 144 mg via INTRAVENOUS
  Filled 2021-12-21: qty 144

## 2021-12-21 MED ORDER — MAGNESIUM SULFATE 2 GM/50ML IV SOLN
2.0000 g | Freq: Once | INTRAVENOUS | Status: AC
  Administered 2021-12-21: 2 g via INTRAVENOUS

## 2021-12-21 MED ORDER — SODIUM CHLORIDE 0.9 % IV SOLN: Freq: Once | INTRAVENOUS | Status: AC

## 2021-12-21 MED ORDER — HEPARIN SOD (PORK) LOCK FLUSH 100 UNIT/ML IV SOLN
500.0000 [IU] | Freq: Once | INTRAVENOUS | Status: AC | PRN
  Administered 2021-12-21: 500 [IU]

## 2021-12-21 MED ORDER — SODIUM CHLORIDE 0.9 % IV SOLN
10.0000 mg | Freq: Once | INTRAVENOUS | Status: AC
  Administered 2021-12-21: 10 mg via INTRAVENOUS
  Filled 2021-12-21: qty 10

## 2021-12-21 MED ORDER — SODIUM CHLORIDE 0.9 % IV SOLN
150.0000 mg | Freq: Once | INTRAVENOUS | Status: AC
  Administered 2021-12-21: 150 mg via INTRAVENOUS
  Filled 2021-12-21: qty 150

## 2021-12-21 MED ORDER — SODIUM CHLORIDE 0.9 % IV SOLN
500.0000 mg/m2 | Freq: Once | INTRAVENOUS | Status: AC
  Administered 2021-12-21: 1000 mg via INTRAVENOUS
  Filled 2021-12-21: qty 40

## 2021-12-21 MED ORDER — POTASSIUM CHLORIDE IN NACL 20-0.9 MEQ/L-% IV SOLN: Freq: Once | INTRAVENOUS | Status: AC

## 2021-12-21 NOTE — Patient Instructions (Signed)
Pemetrexed Injection What is this medication? PEMETREXED (PEM e TREX ed) treats some types of cancer. It works by slowing down the growth of cancer cells. This medicine may be used for other purposes; ask your health care provider or pharmacist if you have questions. COMMON BRAND NAME(S): Alimta, PEMFEXY What should I tell my care team before I take this medication? They need to know if you have any of these conditions: Infection, such as chickenpox, cold sores, or herpes Kidney disease Low blood cell levels (white cells, red cells, and platelets) Lung or breathing disease, such as asthma Radiation therapy An unusual or allergic reaction to pemetrexed, other medications, foods, dyes, or preservatives If you or your partner are pregnant or trying to get pregnant Breast-feeding How should I use this medication? This medication is injected into a vein. It is given by your care team in a hospital or clinic setting. Talk to your care team about the use of this medication in children. Special care may be needed. Overdosage: If you think you have taken too much of this medicine contact a poison control center or emergency room at once. NOTE: This medicine is only for you. Do not share this medicine with others. What if I miss a dose? Keep appointments for follow-up doses. It is important not to miss your dose. Call your care team if you are unable to keep an appointment. What may interact with this medication? Do not take this medication with any of the following: Live virus vaccines This medication may also interact with the following: Ibuprofen This list may not describe all possible interactions. Give your health care provider a list of all the medicines, herbs, non-prescription drugs, or dietary supplements you use. Also tell them if you smoke, drink alcohol, or use illegal drugs. Some items may interact with your medicine. What should I watch for while using this medication? Your condition  will be monitored carefully while you are receiving this medication. This medication may make you feel generally unwell. This is not uncommon as chemotherapy can affect healthy cells as well as cancer cells. Report any side effects. Continue your course of treatment even though you feel ill unless your care team tells you to stop. This medication can cause serious side effects. To reduce the risk, your care team may give you other medications to take before receiving this one. Be sure to follow the directions from your care team. This medication can cause a rash or redness in areas of the body that have previously had radiation therapy. If you have had radiation therapy, tell your care team if you notice a rash in this area. This medication may increase your risk of getting an infection. Call your care team for advice if you get a fever, chills, sore throat, or other symptoms of a cold or flu. Do not treat yourself. Try to avoid being around people who are sick. Be careful brushing or flossing your teeth or using a toothpick because you may get an infection or bleed more easily. If you have any dental work done, tell your dentist you are receiving this medication. Avoid taking medications that contain aspirin, acetaminophen, ibuprofen, naproxen, or ketoprofen unless instructed by your care team. These medications may hide a fever. Check with your care team if you have severe diarrhea, nausea, and vomiting, or if you sweat a lot. The loss of too much body fluid may make it dangerous for you to take this medication. Talk to your care team if you or  your partner wish to become pregnant or think either of you might be pregnant. This medication can cause serious birth defects if taken during pregnancy and for 6 months after the last dose. A negative pregnancy test is required before starting this medication. A reliable form of contraception is recommended while taking this medication and for 6 months after the  last dose. Talk to your care team about reliable forms of contraception. Do not father a child while taking this medication and for 3 months after the last dose. Use a condom while having sex during this time period. Do not breastfeed while taking this medication and for 1 week after the last dose. This medication may cause infertility. Talk to your care team if you are concerned about your fertility. What side effects may I notice from receiving this medication? Side effects that you should report to your care team as soon as possible: Allergic reactions--skin rash, itching, hives, swelling of the face, lips, tongue, or throat Dry cough, shortness of breath or trouble breathing Infection--fever, chills, cough, sore throat, wounds that don't heal, pain or trouble when passing urine, general feeling of discomfort or being unwell Kidney injury--decrease in the amount of urine, swelling of the ankles, hands, or feet Low red blood cell level--unusual weakness or fatigue, dizziness, headache, trouble breathing Redness, blistering, peeling, or loosening of the skin, including inside the mouth Unusual bruising or bleeding Side effects that usually do not require medical attention (report to your care team if they continue or are bothersome): Fatigue Loss of appetite Nausea Vomiting This list may not describe all possible side effects. Call your doctor for medical advice about side effects. You may report side effects to FDA at 1-800-FDA-1088. Where should I keep my medication? This medication is given in a hospital or clinic. It will not be stored at home. NOTE: This sheet is a summary. It may not cover all possible information. If you have questions about this medicine, talk to your doctor, pharmacist, or health care provider.  2023 Elsevier/Gold Standard (2021-08-31 00:00:00) Cisplatin Injection What is this medication? CISPLATIN (SIS pla tin) treats some types of cancer. It works by slowing down  the growth of cancer cells. This medicine may be used for other purposes; ask your health care provider or pharmacist if you have questions. COMMON BRAND NAME(S): Platinol, Platinol -AQ What should I tell my care team before I take this medication? They need to know if you have any of these conditions: Eye disease, vision problems Hearing problems Kidney disease Low blood counts, such as low white cells, platelets, or red blood cells Tingling of the fingers or toes, or other nerve disorder An unusual or allergic reaction to cisplatin, carboplatin, oxaliplatin, other medications, foods, dyes, or preservatives If you or your partner are pregnant or trying to get pregnant Breast-feeding How should I use this medication? This medication is injected into a vein. It is given by your care team in a hospital or clinic setting. Talk to your care team about the use of this medication in children. Special care may be needed. Overdosage: If you think you have taken too much of this medicine contact a poison control center or emergency room at once. NOTE: This medicine is only for you. Do not share this medicine with others. What if I miss a dose? Keep appointments for follow-up doses. It is important not to miss your dose. Call your care team if you are unable to keep an appointment. What may interact with this  medication? Do not take this medication with any of the following: Live virus vaccines This medication may also interact with the following: Certain antibiotics, such as amikacin, gentamicin, neomycin, polymyxin B, streptomycin, tobramycin, vancomycin Foscarnet This list may not describe all possible interactions. Give your health care provider a list of all the medicines, herbs, non-prescription drugs, or dietary supplements you use. Also tell them if you smoke, drink alcohol, or use illegal drugs. Some items may interact with your medicine. What should I watch for while using this  medication? Your condition will be monitored carefully while you are receiving this medication. You may need blood work done while taking this medication. This medication may make you feel generally unwell. This is not uncommon, as chemotherapy can affect healthy cells as well as cancer cells. Report any side effects. Continue your course of treatment even though you feel ill unless your care team tells you to stop. This medication may increase your risk of getting an infection. Call your care team for advice if you get a fever, chills, sore throat, or other symptoms of a cold or flu. Do not treat yourself. Try to avoid being around people who are sick. Avoid taking medications that contain aspirin, acetaminophen, ibuprofen, naproxen, or ketoprofen unless instructed by your care team. These medications may hide a fever. This medication may increase your risk to bruise or bleed. Call your care team if you notice any unusual bleeding. Be careful brushing or flossing your teeth or using a toothpick because you may get an infection or bleed more easily. If you have any dental work done, tell your dentist you are receiving this medication. Drink fluids as directed while you are taking this medication. This will help protect your kidneys. Call your care team if you get diarrhea. Do not treat yourself. Talk to your care team if you or your partner wish to become pregnant or think you might be pregnant. This medication can cause serious birth defects if taken during pregnancy and for 14 months after the last dose. A negative pregnancy test is required before starting this medication. A reliable form of contraception is recommended while taking this medication and for 14 months after the last dose. Talk to your care team about effective forms of contraception. Do not father a child while taking this medication and for 11 months after the last dose. Use a condom during sex during this time period. Do not breast-feed  while taking this medication. This medication may cause infertility. Talk to your care team if you are concerned about your fertility. What side effects may I notice from receiving this medication? Side effects that you should report to your care team as soon as possible: Allergic reactions--skin rash, itching, hives, swelling of the face, lips, tongue, or throat Eye pain, change in vision, vision loss Hearing loss, ringing in ears Infection--fever, chills, cough, sore throat, wounds that don't heal, pain or trouble when passing urine, general feeling of discomfort or being unwell Kidney injury--decrease in the amount of urine, swelling of the ankles, hands, or feet Low red blood cell level--unusual weakness or fatigue, dizziness, headache, trouble breathing Painful swelling, warmth, or redness of the skin, blisters or sores at the infusion site Pain, tingling, or numbness in the hands or feet Unusual bruising or bleeding Side effects that usually do not require medical attention (report to your care team if they continue or are bothersome): Hair loss Nausea Vomiting This list may not describe all possible side effects. Call your doctor  for medical advice about side effects. You may report side effects to FDA at 1-800-FDA-1088. Where should I keep my medication? This medication is given in a hospital or clinic. It will not be stored at home. NOTE: This sheet is a summary. It may not cover all possible information. If you have questions about this medicine, talk to your doctor, pharmacist, or health care provider.  2023 Elsevier/Gold Standard (2021-08-24 00:00:00)

## 2021-12-26 DIAGNOSIS — J45998 Other asthma: Secondary | ICD-10-CM | POA: Diagnosis not present

## 2021-12-28 ENCOUNTER — Ambulatory Visit (INDEPENDENT_AMBULATORY_CARE_PROVIDER_SITE_OTHER): Payer: PPO | Admitting: Physician Assistant

## 2021-12-28 ENCOUNTER — Encounter: Payer: Self-pay | Admitting: Physician Assistant

## 2021-12-28 VITALS — BP 118/78 | HR 140 | Temp 97.5°F | Ht 61.0 in | Wt 182.0 lb

## 2021-12-28 DIAGNOSIS — C3432 Malignant neoplasm of lower lobe, left bronchus or lung: Secondary | ICD-10-CM

## 2021-12-28 DIAGNOSIS — M25562 Pain in left knee: Secondary | ICD-10-CM | POA: Diagnosis not present

## 2021-12-28 DIAGNOSIS — R000 Tachycardia, unspecified: Secondary | ICD-10-CM | POA: Diagnosis not present

## 2021-12-28 DIAGNOSIS — I10 Essential (primary) hypertension: Secondary | ICD-10-CM

## 2021-12-28 DIAGNOSIS — M25561 Pain in right knee: Secondary | ICD-10-CM | POA: Diagnosis not present

## 2021-12-28 DIAGNOSIS — K745 Biliary cirrhosis, unspecified: Secondary | ICD-10-CM

## 2021-12-28 DIAGNOSIS — G8929 Other chronic pain: Secondary | ICD-10-CM | POA: Diagnosis not present

## 2021-12-28 DIAGNOSIS — J4540 Moderate persistent asthma, uncomplicated: Secondary | ICD-10-CM

## 2021-12-28 DIAGNOSIS — R9431 Abnormal electrocardiogram [ECG] [EKG]: Secondary | ICD-10-CM

## 2021-12-28 DIAGNOSIS — E559 Vitamin D deficiency, unspecified: Secondary | ICD-10-CM | POA: Diagnosis not present

## 2021-12-28 DIAGNOSIS — R739 Hyperglycemia, unspecified: Secondary | ICD-10-CM | POA: Diagnosis not present

## 2021-12-28 MED ORDER — OXYCODONE HCL 5 MG PO TABS: 5.0000 mg | ORAL_TABLET | Freq: Three times a day (TID) | ORAL | 0 refills | Status: DC | PRN

## 2021-12-28 MED ORDER — PROMETHAZINE HCL 25 MG PO TABS: 25.0000 mg | ORAL_TABLET | Freq: Three times a day (TID) | ORAL | 2 refills | Status: DC | PRN

## 2021-12-28 MED ORDER — AMLODIPINE BESYLATE 5 MG PO TABS: 5.0000 mg | ORAL_TABLET | Freq: Every day | ORAL | 1 refills | Status: DC

## 2021-12-28 NOTE — Progress Notes (Signed)
Subjective:  Patient ID: Natalie Perry, female    DOB: Aug 08, 1960  Age: 61 y.o. MRN: [id number]  Chief Complaint  Patient presents with   Hypertension    HPI  Pt presents for follow up of hypertension. The patient is tolerating the medication well without side effects. Compliance with treatment has been good; including taking medication as directed , maintains a healthy diet  , and following up as directed. She is currently on norvasc 5mg  qd and hyzaar 100/25 qd -- it is noted that she is tachycardic today and when reviewing records in past few months from specialists offices it has been elevated as well She denies chest pain/sob/palpitations She states she has not seen a cardiologist in several years and unsure who she has seen in the past  Pt with recent history of left lung adenocarcinoma and had LLL lobectomy 4/23.  She is currently following with Dr Bobby Rumpf receiving chemotherapy every 3 weeks.  Her last treatment was last week. She states she has been very fatigued with some associated nausea, vomiting and malaise.  Pt with chronic history of COPD - stable on current medications - she currently uses symbicort and uses albuterol nebulizer prn.  She follows with pulmonologist Dr Alcide Clever on regular basis Pt with history of PE in past as well and currently on Eliquis  Pt with history of hypothyroidism - currently taking synthroid 168mcg qd - due for labwork - she has been more fatigued than usual but thinks due to current chemotherapy treatment  Pt with history of Vit D insufficiency - is due for labwork - is currently taking weekly supplements  Pt with known history of thrombocytopenia and follows Dr Bobby Rumpf for this issue as well - This is secondary to Hepatitis C cirrhosis and she have secondary splenomegaly Current Outpatient Medications on File Prior to Visit  Medication Sig Dispense Refill   acetaminophen (TYLENOL) 500 MG tablet Take 500 mg by mouth every 6 (six) hours as needed  (pain.).     albuterol (PROVENTIL) (2.5 MG/3ML) 0.083% nebulizer solution INHALE 1 VIAL VIA NEBULIZER 3 TO 4 TIMES A DAY AS DIRECTED 300 mL 3   apixaban (ELIQUIS) 5 MG TABS tablet Take 1 tablet by mouth twice daily 60 tablet 2   betamethasone dipropionate (DIPROLENE) 0.05 % ointment Apply 1 application. topically daily as needed (skin irritation.).     dexamethasone (DECADRON) 4 MG tablet Take 5 tabs at the night before and 5 tab the morning of chemotherapy, every 3 weeks, by mouth 60 tablet 0   folic acid (FOLVITE) 1 MG tablet Take 1 tablet (1 mg total) by mouth daily. 60 tablet 3   levothyroxine (SYNTHROID) 100 MCG tablet TAKE ONE TABLET BY MOUTH EVERY MORNING 90 tablet 0   loratadine (CLARITIN) 10 MG tablet Take by mouth.     LORazepam (ATIVAN) 1 MG tablet TAKE 1 TABLET(1 MG) BY MOUTH THREE TIMES DAILY AS NEEDED FOR ANXIETY 90 tablet 0   losartan-hydrochlorothiazide (HYZAAR) 100-25 MG tablet Take 1 tablet by mouth daily. 90 tablet 0   Polyethyl Glycol-Propyl Glycol (LUBRICANT EYE DROPS) 0.4-0.3 % SOLN Place 1-2 drops into both eyes 3 (three) times daily as needed (dry/irritated eyes.).     potassium chloride SA (KLOR-CON M) 20 MEQ tablet Take 2 tablets (40 mEq total) by mouth 2 (two) times daily. 360 tablet 0   SYMBICORT 160-4.5 MCG/ACT inhaler Inhale 2 puffs into the lungs 2 (two) times daily. (Patient taking differently: Inhale 1 puff into the lungs 2 (  two) times daily.) 1 each 12   Vitamin D, Ergocalciferol, (DRISDOL) 1.25 MG (50000 UNIT) CAPS capsule TAKE 1 CAPSULE BY MOUTH EVERY 7 DAYS (Patient taking differently: Take 50,000 Units by mouth every Wednesday.) 12 capsule 1   No current facility-administered medications on file prior to visit.   Past Medical History:  Diagnosis Date   Anxiety    Arthritis    Asthma    Bipolar affective disorder ([place])    Cirrhosis of liver due to hepatitis C    Hepatitis    Hypertension    Hypothyroidism    Pulmonary embolus ([place]) 10/2020   Thyroid  disease    Past Surgical History:  Procedure Laterality Date   ABDOMINAL HYSTERECTOMY     ABDOMINAL SURGERY     APPENDECTOMY     CHOLECYSTECTOMY     INTERCOSTAL NERVE BLOCK Left 08/31/2021   Procedure: INTERCOSTAL NERVE BLOCK;  Surgeon: [name], MD;  Location: [facility] OR;  Service: Thoracic;  Laterality: Left;   NODE DISSECTION Left 08/31/2021   Procedure: NODE DISSECTION;  Surgeon: [name], MD;  Location: [facility];  Service: Thoracic;  Laterality: Left;   XI ROBOTIC ASSISTED THORACOSCOPY- SEGMENTECTOMY Left 08/31/2021   Procedure: XI ROBOTIC ASSISTED THORACOSCOPY- LEFT LOWER LOBE LOBECTOMY;  Surgeon: [name], MD;  Location: [facility] OR;  Service: Thoracic;  Laterality: Left;    Family History  Problem Relation Age of Onset   Asthma Mother    Bipolar disorder Mother    CAD Father    Hypertension Father    Social History   Socioeconomic History   Marital status: Divorced    Spouse name: Not on file   Number of children: 2   Years of education: Not on file   Highest education level: Not on file  Occupational History   Occupation: disabled  Tobacco Use   Smoking status: Never   Smokeless tobacco: Never  Vaping Use   Vaping Use: Never used  Substance and Sexual Activity   Alcohol use: No   Drug use: Never   Sexual activity: Not on file  Other Topics Concern   Not on file  Social History Narrative   Not on file   Social Determinants of Health   Financial Resource Strain: Not on file  Food Insecurity: No Food Insecurity (05/22/2020)   Hunger Vital Sign    Worried About Running Out of Food in the Last Year: Never true    Ran Out of Food in the Last Year: Never true  Transportation Needs: No Transportation Needs (05/22/2020)   PRAPARE - Hydrologist (Medical): No    Lack of Transportation (Non-Medical): No  Physical Activity: Insufficiently Active (07/23/2021)   Exercise Vital Sign    Days of Exercise per Week: 2  days    Minutes of Exercise per Session: 30 min  Stress: Not on file  Social Connections: Not on file    Review of Systems  CONSTITUTIONAL: see HPI E/N/T: Negative for ear pain, nasal congestion and sore throat.  CARDIOVASCULAR: see HPI RESPIRATORY: Negative for recent cough and dyspnea.  GASTROINTESTINAL: Negative for abdominal pain, acid reflux symptoms, constipation, diarrhea, nausea and vomiting.   INTEGUMENTARY: Negative for rash.   PSYCHIATRIC: she has had some anxiety regarding her current treatments but is controlled with ativan which she takes as needed     Objective:  PHYSICAL EXAM:   VS: BP 118/78 (BP Location: Left Arm, Patient Position: Sitting, Cuff Size: Large)  Pulse (!) 140   Temp (!) 97.5 F (36.4 C) (Temporal)   Ht 5\' 1"  (1.549 m)   Wt 182 lb (82.6 kg)   SpO2 94%   BMI 34.39 kg/m   GEN: Well nourished, well developed, - appears fatigued  Cardiac: tachycardic; no murmurs, rubs, or gallops,no edema -  Respiratory:  normal respiratory rate and pattern with no distress - normal breath sounds with no rales, rhonchi, wheezes or rubs  MS: no deformity or atrophy  Skin: warm and dry, no rash   Psych: euthymic mood, appropriate affect and demeanor  EKG - sinus tachycardia - new [place] inversion noted in one lead from prior EKG 4/23 Lab Results  Component Value Date   WBC 6.7 11/26/2021   HGB 12.4 11/26/2021   HCT 37 11/26/2021   PLT 128 (A) 11/26/2021   GLUCOSE 131 (H) 09/04/2021   CHOL 104 08/26/2021   TRIG 38 08/26/2021   HDL 43 08/26/2021   LDLCALC 51 08/26/2021   ALT 24 11/26/2021   AST 38 (A) 11/26/2021   NA 137 11/26/2021   K 3.8 11/26/2021   CL 105 11/26/2021   CREATININE 0.5 11/26/2021   BUN 11 11/26/2021   CO2 24 (A) 11/26/2021   TSH 2.090 08/26/2021   INR 1.3 (H) 08/28/2021      Assessment & Plan:   Problem List Items Addressed This Visit       Cardiovascular and Mediastinum   Benign hypertension   Relevant Medications     Continue current meds   Other Relevant Orders   CBC with Differential/Platelet   Comprehensive metabolic panel   TSH   Lipid panel     Respiratory   Lung cancer, lower lobe (HCC) (Chronic)   Relevant Orders    Continue follow up with Dr Alcide Clever   Moderate persistent asthma without complication Continue current meds     Digestive   Hepatic cirrhosis (HCC)   Relevant Orders   Comprehensive metabolic panel     Other   Vitamin D deficiency   Relevant Orders   VITAMIN D 25 Hydroxy (Vit-D Deficiency, Fractures) Continue meds   Chronic pain of both knees   Relevant Medications   oxyCODONE (OXY IR/ROXICODONE) 5 MG immediate release tablet   Other Visit Diagnoses     Tachycardia    -  Primary   Relevant Orders   EKG 12-Lead   CBC with Differential/Platelet   Comprehensive metabolic panel   TSH   Lipid panel   Magnesium   Ambulatory referral to Cardiology   Abnormal EKG       Relevant Orders   Ambulatory referral to Cardiology     .  Meds ordered this encounter  Medications   promethazine (PHENERGAN) 25 MG tablet    Sig: Take 1 tablet (25 mg total) by mouth every 8 (eight) hours as needed for nausea or vomiting.    Dispense:  30 tablet    Refill:  2    Order Specific Question:   Supervising Provider    Answer:   Shelton Silvas   oxyCODONE (OXY IR/ROXICODONE) 5 MG immediate release tablet    Sig: Take 1 tablet (5 mg total) by mouth 3 (three) times daily as needed.    Dispense:  60 tablet    Refill:  0    Order Specific Question:   Supervising Provider    Answer:   COX, Lynder Parents   amLODipine (NORVASC) 5 MG tablet    Sig: Take 1 tablet (  5 mg total) by mouth daily.    Dispense:  90 tablet    Refill:  1    Order Specific Question:   Supervising Provider    AnswerShelton Silvas    Orders Placed This Encounter  Procedures   CBC with Differential/Platelet   Comprehensive metabolic panel   TSH   Lipid panel   VITAMIN D 25 Hydroxy  (Vit-D Deficiency, Fractures)   Magnesium   Ambulatory referral to Cardiology   EKG 12-Lead     Follow-up: Return in about 3 months (around 03/30/2022) for follow up.  An After Visit Summary was printed and given to the patient.  Yetta Flock [facility] [phone]

## 2021-12-29 ENCOUNTER — Other Ambulatory Visit: Payer: Self-pay | Admitting: Physician Assistant

## 2021-12-29 ENCOUNTER — Encounter: Payer: Self-pay | Admitting: Oncology

## 2021-12-29 ENCOUNTER — Telehealth: Payer: Self-pay

## 2021-12-29 DIAGNOSIS — R739 Hyperglycemia, unspecified: Secondary | ICD-10-CM

## 2021-12-29 LAB — COMPREHENSIVE METABOLIC PANEL
ALT: 43 IU/L — ABNORMAL HIGH (ref 0–32)
AST: 70 IU/L — ABNORMAL HIGH (ref 0–40)
Albumin/Globulin Ratio: 1.4 (ref 1.2–2.2)
Albumin: 3.7 g/dL — ABNORMAL LOW (ref 3.9–4.9)
Alkaline Phosphatase: 78 IU/L (ref 44–121)
BUN/Creatinine Ratio: 17 (ref 12–28)
BUN: 19 mg/dL (ref 8–27)
Bilirubin Total: 0.7 mg/dL (ref 0.0–1.2)
CO2: 21 mmol/L (ref 20–29)
Calcium: 8.6 mg/dL — ABNORMAL LOW (ref 8.7–10.3)
Chloride: 97 mmol/L (ref 96–106)
Creatinine, Ser: 1.09 mg/dL — ABNORMAL HIGH (ref 0.57–1.00)
Globulin, Total: 2.7 g/dL (ref 1.5–4.5)
Glucose: 140 mg/dL — ABNORMAL HIGH (ref 70–99)
Potassium: 4.1 mmol/L (ref 3.5–5.2)
Sodium: 131 mmol/L — ABNORMAL LOW (ref 134–144)
Total Protein: 6.4 g/dL (ref 6.0–8.5)
eGFR: 58 mL/min/{1.73_m2} — ABNORMAL LOW (ref >59)

## 2021-12-29 LAB — VITAMIN D 25 HYDROXY (VIT D DEFICIENCY, FRACTURES): Vit D, 25-Hydroxy: 52.8 ng/mL (ref 30.0–100.0)

## 2021-12-29 LAB — CBC WITH DIFFERENTIAL/PLATELET
Basophils Absolute: 0 10*3/uL (ref 0.0–0.2)
Basos: 1 %
EOS (ABSOLUTE): 0 10*3/uL (ref 0.0–0.4)
Eos: 1 %
Hematocrit: 31.1 % — ABNORMAL LOW (ref 34.0–46.6)
Hemoglobin: 10.8 g/dL — ABNORMAL LOW (ref 11.1–15.9)
Immature Grans (Abs): 0 10*3/uL (ref 0.0–0.1)
Immature Granulocytes: 2 %
Lymphocytes Absolute: 0.2 10*3/uL — ABNORMAL LOW (ref 0.7–3.1)
Lymphs: 16 %
MCH: 28.5 pg (ref 26.6–33.0)
MCHC: 34.7 g/dL (ref 31.5–35.7)
MCV: 82 fL (ref 79–97)
Monocytes Absolute: 0.2 10*3/uL (ref 0.1–0.9)
Monocytes: 14 %
Neutrophils Absolute: 0.8 10*3/uL — ABNORMAL LOW (ref 1.4–7.0)
Neutrophils: 66 %
Platelets: 52 10*3/uL — CL (ref 150–450)
RBC: 3.79 x10E6/uL (ref 3.77–5.28)
RDW: 14.7 % (ref 11.7–15.4)
WBC: 1.3 10*3/uL — CL (ref 3.4–10.8)

## 2021-12-29 LAB — LIPID PANEL
Chol/HDL Ratio: 3.5 ratio (ref 0.0–4.4)
Cholesterol, Total: 101 mg/dL (ref 100–199)
HDL: 29 mg/dL — ABNORMAL LOW (ref >39)
LDL Chol Calc (NIH): 57 mg/dL (ref 0–99)
Triglycerides: 72 mg/dL (ref 0–149)
VLDL Cholesterol Cal: 15 mg/dL (ref 5–40)

## 2021-12-29 LAB — MAGNESIUM: Magnesium: 2 mg/dL (ref 1.6–2.3)

## 2021-12-29 LAB — CARDIOVASCULAR RISK ASSESSMENT

## 2021-12-29 LAB — TSH: TSH: 5.47 u[IU]/mL — ABNORMAL HIGH (ref 0.450–4.500)

## 2021-12-29 NOTE — Telephone Encounter (Signed)
-----   Message from Melodye Ped, NP sent at 12/29/2021  4:47 PM EDT ----- Regarding: RE: WBC 1.3, plt 52 Contact: [phone] No, she is on treatment, so labs most likely reflect nadir.  ----- Message ----- From: Dairl Ponder, RN Sent: 12/29/2021   4:35 PM EDT To: Melodye Ped, NP Subject: WBC 1.3, plt 52                                Received call from [facility]. Pt was in office today, they drew labs. WBC 1.3, plt 52. Pt next appt here is 01/07/2022. Does she need to be seen sooner?

## 2021-12-29 NOTE — Telephone Encounter (Signed)
Called patient, Natalie Perry wanted to know when is she due to see Dr. Bobby Rumpf? Left message for patient to give the office a call back.

## 2021-12-30 ENCOUNTER — Other Ambulatory Visit: Payer: Self-pay | Admitting: Oncology

## 2021-12-30 LAB — HGB A1C W/O EAG: Hgb A1c MFr Bld: 5.3 % (ref 4.8–5.6)

## 2021-12-30 LAB — SPECIMEN STATUS REPORT

## 2021-12-30 NOTE — Telephone Encounter (Signed)
I would like this to be reviewed by Dr Bobby Rumpf - when pt was in office she was more fatigued and weak than she was after first chemo treatment and is noted that her numbers did not drop after her first treatment --- this time was significant - my concern is should she wait 10 more days until she is seen by your office before repeating labwork or having follow up appt ---- please advise

## 2021-12-31 ENCOUNTER — Other Ambulatory Visit: Payer: Self-pay | Admitting: Physician Assistant

## 2021-12-31 ENCOUNTER — Encounter: Payer: Self-pay | Admitting: Oncology

## 2021-12-31 NOTE — Telephone Encounter (Signed)
Dr Bobby Rumpf states he called [facility] yesterday, 12/30/2021, regarding this pt and concerns.

## 2022-01-01 NOTE — Progress Notes (Signed)
Sent in request for DOS 01/07/2022 to [employer].

## 2022-01-06 ENCOUNTER — Other Ambulatory Visit: Payer: Self-pay | Admitting: Oncology

## 2022-01-06 ENCOUNTER — Telehealth: Payer: Self-pay

## 2022-01-06 NOTE — Progress Notes (Unsigned)
Chronic Care Management Pharmacy Assistant   Name: ZENOLA DEZARN  MRN: [id number] DOB: 06-03-1960   Reason for Encounter: Medication Coordination for [facility]    Recent office visits:  12/28/21 [name] PA-C. Seen for HTN. Referral to Cardiology. D/C Ondansetron HCI 4mg , Pregabalin 50mg , Prochlorperazine 10mg .   Recent consult visits:  12/17/21 (Oncology) Lavera Guise MD. Seen for follow up. No med changes.  12/08/21 (Surgical) Liminger, Clayburn Pert MD. Seen for post op. No med changes.   12/02/21 (Oncology) Dayton Scrape NP. Orders Only. Ordered Decadron 4mg  Take 5 tabs at the night before and 5 tab the morning of chemotherapy, every 3 weeks, by mouth.  11/23/21 (Surgical) Liminger, Clayburn Pert MD. Seen for port placement. No med changes.  Hospital visits:  None  Medications: Outpatient Encounter Medications as of 01/06/2022  Medication Sig   acetaminophen (TYLENOL) 500 MG tablet Take 500 mg by mouth every 6 (six) hours as needed (pain.).   albuterol (PROVENTIL) (2.5 MG/3ML) 0.083% nebulizer solution INHALE 1 VIAL VIA NEBULIZER 3 TO 4 TIMES A DAY AS DIRECTED   amLODipine (NORVASC) 5 MG tablet Take 1 tablet (5 mg total) by mouth daily.   apixaban (ELIQUIS) 5 MG TABS tablet Take 1 tablet by mouth twice daily   betamethasone dipropionate (DIPROLENE) 0.05 % ointment Apply 1 application. topically daily as needed (skin irritation.).   dexamethasone (DECADRON) 4 MG tablet Take 5 tabs at the night before and 5 tab the morning of chemotherapy, every 3 weeks, by mouth   folic acid (FOLVITE) 1 MG tablet Take 1 tablet (1 mg total) by mouth daily.   levothyroxine (SYNTHROID) 100 MCG tablet TAKE ONE TABLET BY MOUTH EVERY MORNING   loratadine (CLARITIN) 10 MG tablet Take by mouth.   LORazepam (ATIVAN) 1 MG tablet TAKE 1 TABLET(1 MG) BY MOUTH THREE TIMES DAILY AS NEEDED FOR ANXIETY   losartan-hydrochlorothiazide (HYZAAR) 100-25 MG tablet Take 1 tablet by mouth daily.   oxyCODONE  (OXY IR/ROXICODONE) 5 MG immediate release tablet Take 1 tablet (5 mg total) by mouth 3 (three) times daily as needed.   Polyethyl Glycol-Propyl Glycol (LUBRICANT EYE DROPS) 0.4-0.3 % SOLN Place 1-2 drops into both eyes 3 (three) times daily as needed (dry/irritated eyes.).   potassium chloride SA (KLOR-CON M) 20 MEQ tablet Take 2 tablets (40 mEq total) by mouth 2 (two) times daily.   promethazine (PHENERGAN) 25 MG tablet Take 1 tablet (25 mg total) by mouth every 8 (eight) hours as needed for nausea or vomiting.   SYMBICORT 160-4.5 MCG/ACT inhaler Inhale 2 puffs into the lungs 2 (two) times daily. (Patient taking differently: Inhale 1 puff into the lungs 2 (two) times daily.)   Vitamin D, Ergocalciferol, (DRISDOL) 1.25 MG (50000 UNIT) CAPS capsule TAKE 1 CAPSULE BY MOUTH EVERY 7 DAYS   No facility-administered encounter medications on file as of 01/06/2022.    Reviewed chart for medication changes ahead of medication coordination call.  No hospital visits since last care coordination call/Pharmacist visit.   BP Readings from Last 3 Encounters:  12/28/21 118/78  12/21/21 137/82  12/17/21 (!) 142/86    Lab Results  Component Value Date   HGBA1C 5.3 12/28/2021     Patient obtains medications through V[facility]  90 Days   Last adherence delivery included:  Eliquis 5mg  -1 tab twice daily (30DS)  Potassium Chloride 39meq 2 tabs twice daily Levothyroxine 13mcg 1 tab every morning Losartan-HCTZ 100-25mg  1 tab once daily Budesonide-Formot Inhaler 160-4.5 2 puffs twice daily Albuterol  Inhaler 24mcg 1-2 puffs every 4-6 hours prn   Patient declined (meds) last delivery Amlodipine 5mg - Gets at [facility] on 08/25/21 30ds. Pt will get another supply through [facility]  Lorazepam 1mg -Wants to get at [facility] on 09/21/21 30ds Vitamin D- Gets at [facility] Citalopram 20mg - Gets at [facility]  Pregabalin 50mg - Received at [facility] on 09/27/21 30ds  Patient is due for next adherence delivery on:  01/18/22. Called patient and reviewed medications and coordinated delivery.  This delivery to include: Eliquis 5mg  -1 tab twice daily (30DS)  Potassium Chloride 68meq 2 tabs twice daily Levothyroxine 139mcg 1 tab every morning Losartan-HCTZ 100-25mg  1 tab once daily Budesonide-Formot Inhaler 160-4.5 2 puffs twice daily Albuterol Inhaler 12mcg 1-2 puffs every 4-6 hours prn  Folic Acid 1mg -1 tab once daily  Promethazine 25mg -1 tab every 8 hours as needed  Amlodipine 5mg -1 tab once daily   Patient declined the following medications  Decadron 4mg - Filled 12/02/21 84ds at [facility]  Lorazepam 1mg - Filled at [facility] on 12/11/21 30ds Oxycodone 5mg - Filled on 12/28/21 20ds at [facility]  Vitamin 1.25mg - [facility] on 12/31/21 84ds  Patient needs refills  Eliquis 5mg  -1 tab twice daily  Potassium Chloride 17meq  Levothyroxine 144mcg  Losartan-HCTZ 100-25mg   Confirmed delivery date of ***, advised patient that pharmacy will contact them the morning of delivery.   Elray Mcgregor, [place] Pharmacist Assistant  [phone]

## 2022-01-06 NOTE — Progress Notes (Signed)
[facility]  [address] [place],  [place]  [address] [phone]  Clinic Day:  01/07/2022  Referring physician: [name], PA-C  HISTORY OF PRESENT ILLNESS:  The patient is a 61 y.o. female will stage IIB (T1N1 M0) lung adenocarcinoma, status post a left lower lobe lobectomy in April 2023.  With respect to her left lower lobectomy in April 2023, her mass measured 2.1 cm with 1/17 lymph nodes being positive for disease.  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  She comes in today to be evaluated before heading into her 3rd cycle of cisplatin/pemetrexed.  The patient claims to have tolerated her second cycle of chemotherapy fairly well.  However, there was still somewhat increased nausea, but her antinausea regimen help control this.  She continues to deny having any respiratory symptoms or other findings which concern her for early disease recurrence.   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  Labs dating back to 2011 have shown her platelet count consistently around 100K.  As she has not been severely thrombocytopenic, her platelets have been followed conservatively.    PHYSICAL EXAM:  Blood pressure 123/80, pulse (!) 134, temperature 98.4 F (36.9 C), resp. rate 16, height $RemoveBe'5\' 1"'PUxROnJAI$  (1.549 m), weight 183 lb 4.8 oz (83.1 kg), SpO2 93 %. Wt Readings from Last 3 Encounters:  01/07/22 183 lb 4.8 oz (83.1 kg)  12/28/21 182 lb (82.6 kg)  12/21/21 187 lb 0.6 oz (84.8 kg)   Body mass index is 34.63 kg/m. Performance status (ECOG): 1 - Symptomatic but completely ambulatory Physical Exam Constitutional:      Appearance: Normal appearance. She is not ill-appearing.  HENT:     Mouth/Throat:     Mouth: Mucous membranes are moist.     Pharynx: Oropharynx is clear. No oropharyngeal exudate or posterior oropharyngeal erythema.   Cardiovascular:     Rate and Rhythm: Normal rate and regular rhythm.     Heart sounds: No murmur heard.    No friction rub. No gallop.  Pulmonary:     Effort: Pulmonary effort is normal. No respiratory distress.     Breath sounds: Normal breath sounds. No wheezing, rhonchi or rales.  Abdominal:     General: Bowel sounds are normal. There is no distension.     Palpations: Abdomen is soft. There is no mass.     Tenderness: There is no abdominal tenderness.  Musculoskeletal:        General: No swelling.     Right lower leg: No edema.     Left lower leg: No edema.  Lymphadenopathy:     Cervical: No cervical adenopathy.     Upper Body:     Right upper body: No supraclavicular or axillary adenopathy.     Left upper body: No supraclavicular or axillary adenopathy.     Lower Body: No right inguinal adenopathy. No left inguinal adenopathy.  Skin:    General: Skin is warm.     Coloration: Skin is not jaundiced.     Findings: No erythema, lesion or rash.     Comments:    Neurological:     General: No focal deficit present.     Mental Status: She is alert and oriented to person, place, and time. Mental status is at baseline.  Psychiatric:        Mood and Affect: Mood  normal.        Behavior: Behavior normal.        Thought Content: Thought content normal.    ASSESSMENT & PLAN:  Assessment/Plan:  A 61 y.o. female with stage IIB (T1 N1 M0) lung adenocarcinoma, status post a left lower lobe lobectomy in April 2023.  She will proceed with her 3rd cycle of adjuvant cisplatin/pemetrexed early next week.  In clinic today, the patient was found to be very tachycardic on exam.  An EKG did show sinus tachycardia.  She was also mildly orthostatic when checked today.  Based upon this, I will arrange for her to receive 1 L of IV fluids tomorrow to ensure she is more euvolemic before heading into her third cycle cisplatin/pemetrexed next week.  Otherwise, I will see her back in 3 weeks before she heads  into her 4th and final cycle of cisplatin/pemetrexed. The patient understands all the plans discussed today and is in agreement with them.3rd  Bayden Gil Macarthur Critchley, MD

## 2022-01-07 ENCOUNTER — Other Ambulatory Visit: Payer: Self-pay | Admitting: Pharmacist

## 2022-01-07 ENCOUNTER — Inpatient Hospital Stay: Payer: PPO | Admitting: Oncology

## 2022-01-07 ENCOUNTER — Inpatient Hospital Stay: Payer: PPO

## 2022-01-07 ENCOUNTER — Other Ambulatory Visit: Payer: Self-pay

## 2022-01-07 VITALS — BP 123/80 | HR 134 | Temp 98.4°F | Resp 16 | Ht 61.0 in | Wt 183.3 lb

## 2022-01-07 DIAGNOSIS — D702 Other drug-induced agranulocytosis: Secondary | ICD-10-CM

## 2022-01-07 DIAGNOSIS — R000 Tachycardia, unspecified: Secondary | ICD-10-CM | POA: Diagnosis not present

## 2022-01-07 DIAGNOSIS — D696 Thrombocytopenia, unspecified: Secondary | ICD-10-CM

## 2022-01-07 DIAGNOSIS — C3432 Malignant neoplasm of lower lobe, left bronchus or lung: Secondary | ICD-10-CM

## 2022-01-07 DIAGNOSIS — R7989 Other specified abnormal findings of blood chemistry: Secondary | ICD-10-CM | POA: Insufficient documentation

## 2022-01-07 DIAGNOSIS — K7460 Unspecified cirrhosis of liver: Secondary | ICD-10-CM

## 2022-01-07 LAB — CBC AND DIFFERENTIAL
HCT: 32 — AB (ref 36–46)
Hemoglobin: 10.8 — AB (ref 12.0–16.0)
Neutrophils Absolute: 1.17
Platelets: 134 10*3/uL — AB (ref 150–400)
WBC: 2.2

## 2022-01-07 LAB — BASIC METABOLIC PANEL
BUN: 8 (ref 4–21)
CO2: 24 — AB (ref 13–22)
Chloride: 108 (ref 99–108)
Creatinine: 1.2 — AB (ref 0.5–1.1)
Glucose: 133
Potassium: 4.1 mEq/L (ref 3.5–5.1)
Sodium: 139 (ref 137–147)

## 2022-01-07 LAB — COMPREHENSIVE METABOLIC PANEL
Albumin: 3.7 (ref 3.5–5.0)
Calcium: 8.8 (ref 8.7–10.7)

## 2022-01-07 LAB — HEPATIC FUNCTION PANEL
ALT: 23 U/L (ref 7–35)
AST: 38 — AB (ref 13–35)
Alkaline Phosphatase: 67 (ref 25–125)
Bilirubin, Total: 0.4

## 2022-01-07 LAB — CBC: RBC: 3.76 — AB (ref 3.87–5.11)

## 2022-01-07 MED ORDER — LEVOTHYROXINE SODIUM 100 MCG PO TABS: ORAL_TABLET | ORAL | 0 refills | Status: DC

## 2022-01-07 MED ORDER — LOSARTAN POTASSIUM-HCTZ 100-25 MG PO TABS
1.0000 | ORAL_TABLET | Freq: Every day | ORAL | 0 refills | Status: DC
Start: 2022-01-07 — End: 2022-04-07

## 2022-01-07 MED ORDER — APIXABAN 5 MG PO TABS: ORAL_TABLET | ORAL | 2 refills | Status: DC

## 2022-01-07 MED ORDER — POTASSIUM CHLORIDE CRYS ER 20 MEQ PO TBCR: 40.0000 meq | EXTENDED_RELEASE_TABLET | Freq: Two times a day (BID) | ORAL | 0 refills | Status: DC

## 2022-01-07 NOTE — Progress Notes (Signed)
Sent in request for DOS 01/08/2022 to [employer].

## 2022-01-08 ENCOUNTER — Encounter: Payer: Self-pay | Admitting: Oncology

## 2022-01-08 ENCOUNTER — Other Ambulatory Visit: Payer: Self-pay

## 2022-01-08 ENCOUNTER — Inpatient Hospital Stay: Payer: PPO | Attending: Oncology

## 2022-01-08 VITALS — BP 170/86 | HR 88 | Temp 97.3°F | Resp 16

## 2022-01-08 DIAGNOSIS — D702 Other drug-induced agranulocytosis: Secondary | ICD-10-CM

## 2022-01-08 DIAGNOSIS — C3432 Malignant neoplasm of lower lobe, left bronchus or lung: Secondary | ICD-10-CM | POA: Diagnosis not present

## 2022-01-08 DIAGNOSIS — Z5111 Encounter for antineoplastic chemotherapy: Secondary | ICD-10-CM | POA: Diagnosis not present

## 2022-01-08 DIAGNOSIS — R000 Tachycardia, unspecified: Secondary | ICD-10-CM | POA: Insufficient documentation

## 2022-01-08 DIAGNOSIS — R7989 Other specified abnormal findings of blood chemistry: Secondary | ICD-10-CM

## 2022-01-08 MED ORDER — SODIUM CHLORIDE 0.9 % IV SOLN: Freq: Once | INTRAVENOUS | Status: AC

## 2022-01-08 MED FILL — Fosaprepitant Dimeglumine For IV Infusion 150 MG (Base Eq): INTRAVENOUS | Qty: 5 | Status: AC

## 2022-01-08 MED FILL — Dexamethasone Sodium Phosphate Inj 100 MG/10ML: INTRAMUSCULAR | Qty: 1 | Status: AC

## 2022-01-08 MED FILL — Cisplatin Inj 100 MG/100ML (1 MG/ML): INTRAVENOUS | Qty: 140 | Status: AC

## 2022-01-08 MED FILL — Pemetrexed Disodium For IV Soln 100 MG (Base Equiv): INTRAVENOUS | Qty: 36 | Status: AC

## 2022-01-08 NOTE — Patient Instructions (Signed)
Dehydration, Adult Dehydration is condition in which there is not enough water or other fluids in the body. This happens when a person loses more fluids than he or she takes in. Important body parts cannot work right without the right amount of fluids. Any loss of fluids from the body can cause dehydration. Dehydration can be mild, worse, or very bad. It should be treated right away to keep it from getting very bad. What are the causes? This condition may be caused by: Conditions that cause loss of water or other fluids, such as: Watery poop (diarrhea). Vomiting. Sweating a lot. Peeing (urinating) a lot. Not drinking enough fluids, especially when you: Are ill. Are doing things that take a lot of energy to do. Other illnesses and conditions, such as fever or infection. Certain medicines, such as medicines that take extra fluid out of the body (diuretics). Lack of safe drinking water. Not being able to get enough water and food. What increases the risk? The following factors may make you more likely to develop this condition: Having a long-term (chronic) illness that has not been treated the right way, such as: Diabetes. Heart disease. Kidney disease. Being 65 years of age or older. Having a disability. Living in a place that is high above the ground or sea (high in altitude). The thinner, dried air causes more fluid loss. Doing exercises that put stress on your body for a long time. What are the signs or symptoms? Symptoms of dehydration depend on how bad it is. Mild or worse dehydration Thirst. Dry lips or dry mouth. Feeling dizzy or light-headed, especially when you stand up from sitting. Muscle cramps. Your body making: Dark pee (urine). Pee may be the color of tea. Less pee than normal. Less tears than normal. Headache. Very bad dehydration Changes in skin. Skin may: Be cold to the touch (clammy). Be blotchy or pale. Not go back to normal right after you lightly pinch  it and let it go. Little or no tears, pee, or sweat. Changes in vital signs, such as: Fast breathing. Low blood pressure. Weak pulse. Pulse that is more than 100 beats a minute when you are sitting still. Other changes, such as: Feeling very thirsty. Eyes that look hollow (sunken). Cold hands and feet. Being mixed up (confused). Being very tired (lethargic) or having trouble waking from sleep. Short-term weight loss. Loss of consciousness. How is this treated? Treatment for this condition depends on how bad it is. Treatment should start right away. Do not wait until your condition gets very bad. Very bad dehydration is an emergency. You will need to go to a hospital. Mild or worse dehydration can be treated at home. You may be asked to: Drink more fluids. Drink an oral rehydration solution (ORS). This drink helps get the right amounts of fluids and salts and minerals in the blood (electrolytes). Very bad dehydration can be treated: With fluids through an IV tube. By getting normal levels of salts and minerals in your blood. This is often done by giving salts and minerals through a tube. The tube is passed through your nose and into your stomach. By treating the root cause. Follow these instructions at home: Oral rehydration solution If told by your doctor, drink an ORS: Make an ORS. Use instructions on the package. Start by drinking small amounts, about  cup (120 mL) every 5-10 minutes. Slowly drink more until you have had the amount that your doctor said to have. Eating and drinking          Drink enough clear fluid to keep your pee pale yellow. If you were told to drink an ORS, finish the ORS first. Then, start slowly drinking other clear fluids. Drink fluids such as: Water. Do not drink only water. Doing that can make the salt (sodium) level in your body get too low. Water from ice chips you suck on. Fruit juice that you have added water to (diluted). Low-calorie sports  drinks. Eat foods that have the right amounts of salts and minerals, such as: Bananas. Oranges. Potatoes. Tomatoes. Spinach. Do not drink alcohol. Avoid: Drinks that have a lot of sugar. These include: High-calorie sports drinks. Fruit juice that you did not add water to. Soda. Caffeine. Foods that are greasy or have a lot of fat or sugar. General instructions Take over-the-counter and prescription medicines only as told by your doctor. Do not take salt tablets. Doing that can make the salt level in your body get too high. Return to your normal activities as told by your doctor. Ask your doctor what activities are safe for you. Keep all follow-up visits as told by your doctor. This is important. Contact a doctor if: You have pain in your belly (abdomen) and the pain: Gets worse. Stays in one place. You have a rash. You have a stiff neck. You get angry or annoyed (irritable) more easily than normal. You are more tired or have a harder time waking than normal. You feel: Weak or dizzy. Very thirsty. Get help right away if you have: Any symptoms of very bad dehydration. Symptoms of vomiting, such as: You cannot eat or drink without vomiting. Your vomiting gets worse or does not go away. Your vomit has blood or green stuff in it. Symptoms that get worse with treatment. A fever. A very bad headache. Problems with peeing or pooping (having a bowel movement), such as: Watery poop that gets worse or does not go away. Blood in your poop (stool). This may cause poop to look black and tarry. Not peeing in 6-8 hours. Peeing only a small amount of very dark pee in 6-8 hours. Trouble breathing. These symptoms may be an emergency. Do not wait to see if the symptoms will go away. Get medical help right away. Call your local emergency services (911 in the [place] Do not drive yourself to the hospital. Summary Dehydration is a condition in which there is not enough water or other fluids  in the body. This happens when a person loses more fluids than he or she takes in. Treatment for this condition depends on how bad it is. Treatment should be started right away. Do not wait until your condition gets very bad. Drink enough clear fluid to keep your pee pale yellow. If you were told to drink an oral rehydration solution (ORS), finish the ORS first. Then, start slowly drinking other clear fluids. Take over-the-counter and prescription medicines only as told by your doctor. Get help right away if you have any symptoms of very bad dehydration. This information is not intended to replace advice given to you by your health care provider. Make sure you discuss any questions you have with your health care provider. Document Revised: 09/02/2021 Document Reviewed: 12/07/2018 Elsevier Patient Education  2023 Elsevier Inc.  

## 2022-01-11 ENCOUNTER — Other Ambulatory Visit: Payer: Self-pay | Admitting: Physician Assistant

## 2022-01-12 ENCOUNTER — Inpatient Hospital Stay: Payer: PPO

## 2022-01-12 ENCOUNTER — Other Ambulatory Visit: Payer: Self-pay | Admitting: Pharmacist

## 2022-01-12 VITALS — BP 151/72 | HR 78 | Temp 97.4°F | Resp 18 | Wt 184.1 lb

## 2022-01-12 DIAGNOSIS — Z5111 Encounter for antineoplastic chemotherapy: Secondary | ICD-10-CM | POA: Diagnosis not present

## 2022-01-12 DIAGNOSIS — C3432 Malignant neoplasm of lower lobe, left bronchus or lung: Secondary | ICD-10-CM

## 2022-01-12 DIAGNOSIS — D649 Anemia, unspecified: Secondary | ICD-10-CM | POA: Diagnosis not present

## 2022-01-12 DIAGNOSIS — R7989 Other specified abnormal findings of blood chemistry: Secondary | ICD-10-CM

## 2022-01-12 LAB — COMPREHENSIVE METABOLIC PANEL
Albumin: 3.7 (ref 3.5–5.0)
Calcium: 8.7 (ref 8.7–10.7)

## 2022-01-12 LAB — MAGNESIUM: Magnesium: 1.9

## 2022-01-12 LAB — BASIC METABOLIC PANEL
BUN: 15 (ref 4–21)
CO2: 23 — AB (ref 13–22)
Chloride: 104 (ref 99–108)
Creatinine: 0.6 (ref 0.5–1.1)
Glucose: 252
Potassium: 4.1 mEq/L (ref 3.5–5.1)
Sodium: 137 (ref 137–147)

## 2022-01-12 LAB — HEPATIC FUNCTION PANEL
ALT: 22 U/L (ref 7–35)
AST: 36 — AB (ref 13–35)
Alkaline Phosphatase: 81 (ref 25–125)
Bilirubin, Total: 0.7

## 2022-01-12 LAB — CBC AND DIFFERENTIAL
HCT: 31 — AB (ref 36–46)
Hemoglobin: 10.3 — AB (ref 12.0–16.0)
Neutrophils Absolute: 4.93
Platelets: 118 10*3/uL — AB (ref 150–400)
WBC: 5.6

## 2022-01-12 LAB — CBC: RBC: 3.55 — AB (ref 3.87–5.11)

## 2022-01-12 MED ORDER — CYANOCOBALAMIN 1000 MCG/ML IJ SOLN
1000.0000 ug | Freq: Once | INTRAMUSCULAR | Status: AC
  Administered 2022-01-12: 1000 ug via INTRAMUSCULAR
  Filled 2022-01-12: qty 1

## 2022-01-12 MED ORDER — SODIUM CHLORIDE 0.9 % IV SOLN
500.0000 mg/m2 | Freq: Once | INTRAVENOUS | Status: AC
  Administered 2022-01-12: 900 mg via INTRAVENOUS
  Filled 2022-01-12: qty 20

## 2022-01-12 MED ORDER — SODIUM CHLORIDE 0.9 % IV SOLN
74.0000 mg/m2 | Freq: Once | INTRAVENOUS | Status: AC
  Administered 2022-01-12: 140 mg via INTRAVENOUS
  Filled 2022-01-12: qty 140

## 2022-01-12 MED ORDER — HEPARIN SOD (PORK) LOCK FLUSH 100 UNIT/ML IV SOLN
500.0000 [IU] | Freq: Once | INTRAVENOUS | Status: AC | PRN
  Administered 2022-01-12: 500 [IU]

## 2022-01-12 MED ORDER — PALONOSETRON HCL INJECTION 0.25 MG/5ML
0.2500 mg | Freq: Once | INTRAVENOUS | Status: AC
  Administered 2022-01-12: 0.25 mg via INTRAVENOUS
  Filled 2022-01-12: qty 5

## 2022-01-12 MED ORDER — MAGNESIUM SULFATE 2 GM/50ML IV SOLN
2.0000 g | Freq: Once | INTRAVENOUS | Status: AC
  Administered 2022-01-12: 2 g via INTRAVENOUS
  Filled 2022-01-12: qty 50

## 2022-01-12 MED ORDER — SODIUM CHLORIDE 0.9 % IV SOLN
10.0000 mg | Freq: Once | INTRAVENOUS | Status: AC
  Administered 2022-01-12: 10 mg via INTRAVENOUS
  Filled 2022-01-12: qty 10

## 2022-01-12 MED ORDER — SODIUM CHLORIDE 0.9% FLUSH
10.0000 mL | INTRAVENOUS | Status: DC | PRN
  Administered 2022-01-12: 10 mL

## 2022-01-12 MED ORDER — POTASSIUM CHLORIDE IN NACL 20-0.9 MEQ/L-% IV SOLN
Freq: Once | INTRAVENOUS | Status: AC
  Filled 2022-01-12: qty 1000

## 2022-01-12 MED ORDER — SODIUM CHLORIDE 0.9 % IV SOLN
150.0000 mg | Freq: Once | INTRAVENOUS | Status: AC
  Administered 2022-01-12: 150 mg via INTRAVENOUS
  Filled 2022-01-12: qty 150

## 2022-01-12 MED ORDER — SODIUM CHLORIDE 0.9 % IV SOLN: Freq: Once | INTRAVENOUS | Status: AC

## 2022-01-12 NOTE — Patient Instructions (Signed)
Pemetrexed Injection What is this medication? PEMETREXED (PEM e TREX ed) treats some types of cancer. It works by slowing down the growth of cancer cells. This medicine may be used for other purposes; ask your health care provider or pharmacist if you have questions. COMMON BRAND NAME(S): Alimta, PEMFEXY What should I tell my care team before I take this medication? They need to know if you have any of these conditions: Infection, such as chickenpox, cold sores, or herpes Kidney disease Low blood cell levels (white cells, red cells, and platelets) Lung or breathing disease, such as asthma Radiation therapy An unusual or allergic reaction to pemetrexed, other medications, foods, dyes, or preservatives If you or your partner are pregnant or trying to get pregnant Breast-feeding How should I use this medication? This medication is injected into a vein. It is given by your care team in a hospital or clinic setting. Talk to your care team about the use of this medication in children. Special care may be needed. Overdosage: If you think you have taken too much of this medicine contact a poison control center or emergency room at once. NOTE: This medicine is only for you. Do not share this medicine with others. What if I miss a dose? Keep appointments for follow-up doses. It is important not to miss your dose. Call your care team if you are unable to keep an appointment. What may interact with this medication? Do not take this medication with any of the following: Live virus vaccines This medication may also interact with the following: Ibuprofen This list may not describe all possible interactions. Give your health care provider a list of all the medicines, herbs, non-prescription drugs, or dietary supplements you use. Also tell them if you smoke, drink alcohol, or use illegal drugs. Some items may interact with your medicine. What should I watch for while using this medication? Your condition  will be monitored carefully while you are receiving this medication. This medication may make you feel generally unwell. This is not uncommon as chemotherapy can affect healthy cells as well as cancer cells. Report any side effects. Continue your course of treatment even though you feel ill unless your care team tells you to stop. This medication can cause serious side effects. To reduce the risk, your care team may give you other medications to take before receiving this one. Be sure to follow the directions from your care team. This medication can cause a rash or redness in areas of the body that have previously had radiation therapy. If you have had radiation therapy, tell your care team if you notice a rash in this area. This medication may increase your risk of getting an infection. Call your care team for advice if you get a fever, chills, sore throat, or other symptoms of a cold or flu. Do not treat yourself. Try to avoid being around people who are sick. Be careful brushing or flossing your teeth or using a toothpick because you may get an infection or bleed more easily. If you have any dental work done, tell your dentist you are receiving this medication. Avoid taking medications that contain aspirin, acetaminophen, ibuprofen, naproxen, or ketoprofen unless instructed by your care team. These medications may hide a fever. Check with your care team if you have severe diarrhea, nausea, and vomiting, or if you sweat a lot. The loss of too much body fluid may make it dangerous for you to take this medication. Talk to your care team if you or  your partner wish to become pregnant or think either of you might be pregnant. This medication can cause serious birth defects if taken during pregnancy and for 6 months after the last dose. A negative pregnancy test is required before starting this medication. A reliable form of contraception is recommended while taking this medication and for 6 months after the  last dose. Talk to your care team about reliable forms of contraception. Do not father a child while taking this medication and for 3 months after the last dose. Use a condom while having sex during this time period. Do not breastfeed while taking this medication and for 1 week after the last dose. This medication may cause infertility. Talk to your care team if you are concerned about your fertility. What side effects may I notice from receiving this medication? Side effects that you should report to your care team as soon as possible: Allergic reactions--skin rash, itching, hives, swelling of the face, lips, tongue, or throat Dry cough, shortness of breath or trouble breathing Infection--fever, chills, cough, sore throat, wounds that don't heal, pain or trouble when passing urine, general feeling of discomfort or being unwell Kidney injury--decrease in the amount of urine, swelling of the ankles, hands, or feet Low red blood cell level--unusual weakness or fatigue, dizziness, headache, trouble breathing Redness, blistering, peeling, or loosening of the skin, including inside the mouth Unusual bruising or bleeding Side effects that usually do not require medical attention (report to your care team if they continue or are bothersome): Fatigue Loss of appetite Nausea Vomiting This list may not describe all possible side effects. Call your doctor for medical advice about side effects. You may report side effects to FDA at 1-800-FDA-1088. Where should I keep my medication? This medication is given in a hospital or clinic. It will not be stored at home. NOTE: This sheet is a summary. It may not cover all possible information. If you have questions about this medicine, talk to your doctor, pharmacist, or health care provider.  2023 Elsevier/Gold Standard (2021-08-31 00:00:00) Cisplatin Injection What is this medication? CISPLATIN (SIS pla tin) treats some types of cancer. It works by slowing down  the growth of cancer cells. This medicine may be used for other purposes; ask your health care provider or pharmacist if you have questions. COMMON BRAND NAME(S): Platinol, Platinol -AQ What should I tell my care team before I take this medication? They need to know if you have any of these conditions: Eye disease, vision problems Hearing problems Kidney disease Low blood counts, such as low white cells, platelets, or red blood cells Tingling of the fingers or toes, or other nerve disorder An unusual or allergic reaction to cisplatin, carboplatin, oxaliplatin, other medications, foods, dyes, or preservatives If you or your partner are pregnant or trying to get pregnant Breast-feeding How should I use this medication? This medication is injected into a vein. It is given by your care team in a hospital or clinic setting. Talk to your care team about the use of this medication in children. Special care may be needed. Overdosage: If you think you have taken too much of this medicine contact a poison control center or emergency room at once. NOTE: This medicine is only for you. Do not share this medicine with others. What if I miss a dose? Keep appointments for follow-up doses. It is important not to miss your dose. Call your care team if you are unable to keep an appointment. What may interact with this  medication? Do not take this medication with any of the following: Live virus vaccines This medication may also interact with the following: Certain antibiotics, such as amikacin, gentamicin, neomycin, polymyxin B, streptomycin, tobramycin, vancomycin Foscarnet This list may not describe all possible interactions. Give your health care provider a list of all the medicines, herbs, non-prescription drugs, or dietary supplements you use. Also tell them if you smoke, drink alcohol, or use illegal drugs. Some items may interact with your medicine. What should I watch for while using this  medication? Your condition will be monitored carefully while you are receiving this medication. You may need blood work done while taking this medication. This medication may make you feel generally unwell. This is not uncommon, as chemotherapy can affect healthy cells as well as cancer cells. Report any side effects. Continue your course of treatment even though you feel ill unless your care team tells you to stop. This medication may increase your risk of getting an infection. Call your care team for advice if you get a fever, chills, sore throat, or other symptoms of a cold or flu. Do not treat yourself. Try to avoid being around people who are sick. Avoid taking medications that contain aspirin, acetaminophen, ibuprofen, naproxen, or ketoprofen unless instructed by your care team. These medications may hide a fever. This medication may increase your risk to bruise or bleed. Call your care team if you notice any unusual bleeding. Be careful brushing or flossing your teeth or using a toothpick because you may get an infection or bleed more easily. If you have any dental work done, tell your dentist you are receiving this medication. Drink fluids as directed while you are taking this medication. This will help protect your kidneys. Call your care team if you get diarrhea. Do not treat yourself. Talk to your care team if you or your partner wish to become pregnant or think you might be pregnant. This medication can cause serious birth defects if taken during pregnancy and for 14 months after the last dose. A negative pregnancy test is required before starting this medication. A reliable form of contraception is recommended while taking this medication and for 14 months after the last dose. Talk to your care team about effective forms of contraception. Do not father a child while taking this medication and for 11 months after the last dose. Use a condom during sex during this time period. Do not breast-feed  while taking this medication. This medication may cause infertility. Talk to your care team if you are concerned about your fertility. What side effects may I notice from receiving this medication? Side effects that you should report to your care team as soon as possible: Allergic reactions--skin rash, itching, hives, swelling of the face, lips, tongue, or throat Eye pain, change in vision, vision loss Hearing loss, ringing in ears Infection--fever, chills, cough, sore throat, wounds that don't heal, pain or trouble when passing urine, general feeling of discomfort or being unwell Kidney injury--decrease in the amount of urine, swelling of the ankles, hands, or feet Low red blood cell level--unusual weakness or fatigue, dizziness, headache, trouble breathing Painful swelling, warmth, or redness of the skin, blisters or sores at the infusion site Pain, tingling, or numbness in the hands or feet Unusual bruising or bleeding Side effects that usually do not require medical attention (report to your care team if they continue or are bothersome): Hair loss Nausea Vomiting This list may not describe all possible side effects. Call your doctor  for medical advice about side effects. You may report side effects to FDA at 1-800-FDA-1088. Where should I keep my medication? This medication is given in a hospital or clinic. It will not be stored at home. NOTE: This sheet is a summary. It may not cover all possible information. If you have questions about this medicine, talk to your doctor, pharmacist, or health care provider.  2023 Elsevier/Gold Standard (2021-08-24 00:00:00)

## 2022-01-25 ENCOUNTER — Other Ambulatory Visit: Payer: Self-pay | Admitting: Family Medicine

## 2022-01-26 DIAGNOSIS — J45998 Other asthma: Secondary | ICD-10-CM | POA: Diagnosis not present

## 2022-01-27 ENCOUNTER — Encounter: Payer: Self-pay | Admitting: Oncology

## 2022-01-27 NOTE — Progress Notes (Deleted)
[facility]  [address] [place],  [place]  [address] [phone]  Clinic Day:  01/07/2022  Referring physician: [name], PA-C  HISTORY OF PRESENT ILLNESS:  The patient is a 61 y.o. female will stage IIB (T1N1 M0) lung adenocarcinoma, status post a left lower lobe lobectomy in April 2023.  With respect to her left lower lobectomy in April 2023, her mass measured 2.1 cm with 1/17 lymph nodes being positive for disease.  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  She comes in today to be evaluated before heading into her 4th and final cycle of cisplatin/pemetrexed.  The patient claims to have tolerated her second cycle of chemotherapy fairly well.  However, there was still somewhat increased nausea, but her antinausea regimen help control this.  She continues to deny having any respiratory symptoms or other findings which concern her for early disease recurrence.   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  Labs dating back to 2011 have shown her platelet count consistently around 100K.  As she has not been severely thrombocytopenic, her platelets have been followed conservatively.    PHYSICAL EXAM:  There were no vitals taken for this visit. Wt Readings from Last 3 Encounters:  01/12/22 184 lb 1.9 oz (83.5 kg)  01/07/22 183 lb 4.8 oz (83.1 kg)  12/28/21 182 lb (82.6 kg)   There is no height or weight on file to calculate BMI. Performance status (ECOG): 1 - Symptomatic but completely ambulatory Physical Exam Constitutional:      Appearance: Normal appearance. She is not ill-appearing.  HENT:     Mouth/Throat:     Mouth: Mucous membranes are moist.     Pharynx: Oropharynx is clear. No oropharyngeal exudate or posterior oropharyngeal erythema.  Cardiovascular:     Rate and Rhythm: Normal rate and regular rhythm.      Heart sounds: No murmur heard.    No friction rub. No gallop.  Pulmonary:     Effort: Pulmonary effort is normal. No respiratory distress.     Breath sounds: Normal breath sounds. No wheezing, rhonchi or rales.  Abdominal:     General: Bowel sounds are normal. There is no distension.     Palpations: Abdomen is soft. There is no mass.     Tenderness: There is no abdominal tenderness.  Musculoskeletal:        General: No swelling.     Right lower leg: No edema.     Left lower leg: No edema.  Lymphadenopathy:     Cervical: No cervical adenopathy.     Upper Body:     Right upper body: No supraclavicular or axillary adenopathy.     Left upper body: No supraclavicular or axillary adenopathy.     Lower Body: No right inguinal adenopathy. No left inguinal adenopathy.  Skin:    General: Skin is warm.     Coloration: Skin is not jaundiced.     Findings: No erythema, lesion or rash.     Comments:    Neurological:     General: No focal deficit present.     Mental Status: She is alert and oriented to person, place, and time. Mental status is at baseline.  Psychiatric:        Mood and Affect: Mood normal.        Behavior: Behavior normal.  Thought Content: Thought content normal.    ASSESSMENT & PLAN:  Assessment/Plan:  A 61 y.o. female with stage IIB (T1 N1 M0) lung adenocarcinoma, status post a left lower lobe lobectomy in April 2023.  She will proceed with her 3rd cycle of adjuvant cisplatin/pemetrexed early next week.  In clinic today, the patient was found to be very tachycardic on exam.  An EKG did show sinus tachycardia.  She was also mildly orthostatic when checked today.  Based upon this, I will arrange for her to receive 1 L of IV fluids tomorrow to ensure she is more euvolemic before heading into her third cycle cisplatin/pemetrexed next week.  Otherwise, I will see her back in 3 weeks before she heads into her 4th and final cycle of cisplatin/pemetrexed. The patient understands  all the plans discussed today and is in agreement with them.3rd  Natalie Raynor Macarthur Critchley, MD

## 2022-01-28 ENCOUNTER — Ambulatory Visit: Payer: PPO | Admitting: Oncology

## 2022-01-28 ENCOUNTER — Inpatient Hospital Stay: Payer: PPO

## 2022-01-28 NOTE — Progress Notes (Signed)
[place]  [address] [place],  [place]  [address] [phone]  Clinic Day:  01/29/2022  Referring physician: [name], PA-C  HISTORY OF PRESENT ILLNESS:  The patient is a 61 y.o. female will stage IIB (T1N1 M0) lung adenocarcinoma, status post a left lower lobe lobectomy in April 2023.  With respect to her left lower lobectomy in April 2023, her mass measured 2.1 cm with 1/17 lymph nodes being positive for disease.  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  She comes in today to be evaluated before heading into her 4th and final cycle of cisplatin/pemetrexed.  The patient claims to have tolerated her 3rd cycle of chemotherapy okay.  She states she has become progressively fatigued after each successive cycle of adjuvant chemotherapy.  However, she continues to deny having any respiratory symptoms or other findings which concern her for early disease recurrence.   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  Labs dating back to 2011 have shown her platelet count consistently around 100K.  As she has not been severely thrombocytopenic, her platelets have been followed conservatively.    PHYSICAL EXAM:  Blood pressure (!) 152/83, pulse (!) 122, temperature 97.8 F (36.6 C), resp. rate 18, height 5' 1"  (1.549 m), weight 186 lb 12.8 oz (84.7 kg), SpO2 93 %. Wt Readings from Last 3 Encounters:  01/29/22 186 lb 12.8 oz (84.7 kg)  01/12/22 184 lb 1.9 oz (83.5 kg)  01/07/22 183 lb 4.8 oz (83.1 kg)   Body mass index is 35.3 kg/m. Performance status (ECOG): 1 - Symptomatic but completely ambulatory Physical Exam Constitutional:      Appearance: Normal appearance. She is not ill-appearing.  HENT:     Mouth/Throat:     Mouth: Mucous membranes are moist.     Pharynx: Oropharynx is clear. No oropharyngeal exudate or posterior  oropharyngeal erythema.  Cardiovascular:     Rate and Rhythm: Normal rate and regular rhythm.     Heart sounds: No murmur heard.    No friction rub. No gallop.  Pulmonary:     Effort: Pulmonary effort is normal. No respiratory distress.     Breath sounds: Normal breath sounds. No wheezing, rhonchi or rales.  Abdominal:     General: Bowel sounds are normal. There is no distension.     Palpations: Abdomen is soft. There is no mass.     Tenderness: There is no abdominal tenderness.  Musculoskeletal:        General: No swelling.     Right lower leg: No edema.     Left lower leg: No edema.  Lymphadenopathy:     Cervical: No cervical adenopathy.     Upper Body:     Right upper body: No supraclavicular or axillary adenopathy.     Left upper body: No supraclavicular or axillary adenopathy.     Lower Body: No right inguinal adenopathy. No left inguinal adenopathy.  Skin:    General: Skin is warm.     Coloration: Skin is not jaundiced.     Findings: No erythema, lesion or rash.     Comments:    Neurological:     General: No focal deficit present.     Mental Status: She is alert and oriented to person, place, and time. Mental status is at baseline.  Psychiatric:  Mood and Affect: Mood normal.        Behavior: Behavior normal.        Thought Content: Thought content normal.   LABS:  Latest Reference Range & Units 01/29/22 00:00  Sodium 137 - 147  136 ! (E)  Potassium 3.5 - 5.1 mEq/L 3.9 (E)  Chloride 99 - 108  105 (E)  CO2 13 - 22  25 ! (E)  Glucose  115 (E)  BUN 4 - 21  18 (E)  Creatinine 0.5 - 1.1  0.9 (E)  Calcium 8.7 - 10.7  8.6 ! (E)  Alkaline Phosphatase 25 - 125  70 (E)  Albumin 3.5 - 5.0  3.6 (E)  AST 13 - 35  42 ! (E)  ALT 7 - 35 U/L 23 (E)  Bilirubin, Total  0.8 (E)  !: Data is abnormal (E): External lab result  Latest Reference Range & Units 01/29/22 00:00  WBC  2.4 (E)  RBC 3.87 - 5.11  2.94 ! (E)  Hemoglobin 12.0 - 16.0  8.7 ! (E)  HCT 36 - 46  26 ! (E)   Platelets 150 - 400 K/uL 150 (E)  NEUT#  1.27 (E)  !: Data is abnormal (E): External lab result  ASSESSMENT & PLAN:  Assessment/Plan:  A 61 y.o. female with stage IIB (T1 N1 M0) lung adenocarcinoma, status post a left lower lobe lobectomy in April 2023.  She will proceed with her 4th and final cycle of adjuvant cisplatin/pemetrexed early next week.  Due to her low white count, I will give her white cell shot therapy to prevent severe neutropenia from potentially causing an infection over these next few weeks.  As this is her last cycle of adjuvant chemotherapy, the patient understands her peripheral counts should improve over time after she recuperates from her fourth treatment.  I will see this patient back in 6 weeks for repeat clinical assessment.  A chest CT will be done a day before her next visit to ascertain her new disease baseline after the completion of her adjuvant chemotherapy.  The patient understands all the plans discussed today and is in agreement with them.  Mailyn Steichen Macarthur Critchley, MD

## 2022-01-29 ENCOUNTER — Other Ambulatory Visit: Payer: Self-pay | Admitting: Oncology

## 2022-01-29 ENCOUNTER — Inpatient Hospital Stay: Payer: PPO

## 2022-01-29 ENCOUNTER — Inpatient Hospital Stay: Payer: PPO | Admitting: Oncology

## 2022-01-29 DIAGNOSIS — R7989 Other specified abnormal findings of blood chemistry: Secondary | ICD-10-CM | POA: Diagnosis not present

## 2022-01-29 DIAGNOSIS — C3432 Malignant neoplasm of lower lobe, left bronchus or lung: Secondary | ICD-10-CM

## 2022-01-29 LAB — CBC AND DIFFERENTIAL
HCT: 26 — AB (ref 36–46)
Hemoglobin: 8.7 — AB (ref 12.0–16.0)
Neutrophils Absolute: 1.27
Platelets: 150 10*3/uL (ref 150–400)
WBC: 2.4

## 2022-01-29 LAB — BASIC METABOLIC PANEL
BUN: 18 (ref 4–21)
CO2: 25 — AB (ref 13–22)
Chloride: 105 (ref 99–108)
Creatinine: 0.9 (ref 0.5–1.1)
Glucose: 115
Potassium: 3.9 mEq/L (ref 3.5–5.1)
Sodium: 136 — AB (ref 137–147)

## 2022-01-29 LAB — HEPATIC FUNCTION PANEL
ALT: 23 U/L (ref 7–35)
AST: 42 — AB (ref 13–35)
Alkaline Phosphatase: 70 (ref 25–125)
Bilirubin, Total: 0.8

## 2022-01-29 LAB — COMPREHENSIVE METABOLIC PANEL
Albumin: 3.6 (ref 3.5–5.0)
Calcium: 8.6 — AB (ref 8.7–10.7)

## 2022-01-29 LAB — CBC: RBC: 2.94 — AB (ref 3.87–5.11)

## 2022-01-31 ENCOUNTER — Encounter: Payer: Self-pay | Admitting: Oncology

## 2022-02-01 MED FILL — Fosaprepitant Dimeglumine For IV Infusion 150 MG (Base Eq): INTRAVENOUS | Qty: 5 | Status: AC

## 2022-02-01 MED FILL — Dexamethasone Sodium Phosphate Inj 100 MG/10ML: INTRAMUSCULAR | Qty: 1 | Status: AC

## 2022-02-01 MED FILL — Pemetrexed Disodium For IV Soln 500 MG (Base Equiv): INTRAVENOUS | Qty: 36 | Status: AC

## 2022-02-02 ENCOUNTER — Inpatient Hospital Stay: Payer: PPO

## 2022-02-02 VITALS — BP 113/72 | HR 126 | Temp 97.7°F | Resp 18 | Wt 186.0 lb

## 2022-02-02 DIAGNOSIS — Z5111 Encounter for antineoplastic chemotherapy: Secondary | ICD-10-CM | POA: Diagnosis not present

## 2022-02-02 DIAGNOSIS — C3432 Malignant neoplasm of lower lobe, left bronchus or lung: Secondary | ICD-10-CM

## 2022-02-02 MED ORDER — SODIUM CHLORIDE 0.9 % IV SOLN: Freq: Once | INTRAVENOUS | Status: AC

## 2022-02-02 MED ORDER — POTASSIUM CHLORIDE IN NACL 20-0.9 MEQ/L-% IV SOLN
Freq: Once | INTRAVENOUS | Status: AC
  Filled 2022-02-02: qty 1000

## 2022-02-02 MED ORDER — HEPARIN SOD (PORK) LOCK FLUSH 100 UNIT/ML IV SOLN
500.0000 [IU] | Freq: Once | INTRAVENOUS | Status: AC | PRN
  Administered 2022-02-02: 500 [IU]

## 2022-02-02 MED ORDER — SODIUM CHLORIDE 0.9 % IV SOLN
74.0000 mg/m2 | Freq: Once | INTRAVENOUS | Status: AC
  Administered 2022-02-02: 140 mg via INTRAVENOUS
  Filled 2022-02-02: qty 140

## 2022-02-02 MED ORDER — MAGNESIUM SULFATE 2 GM/50ML IV SOLN
2.0000 g | Freq: Once | INTRAVENOUS | Status: AC
  Administered 2022-02-02: 2 g via INTRAVENOUS
  Filled 2022-02-02: qty 50

## 2022-02-02 MED ORDER — SODIUM CHLORIDE 0.9 % IV SOLN
10.0000 mg | Freq: Once | INTRAVENOUS | Status: AC
  Administered 2022-02-02: 10 mg via INTRAVENOUS
  Filled 2022-02-02: qty 10

## 2022-02-02 MED ORDER — SODIUM CHLORIDE 0.9 % IV SOLN
150.0000 mg | Freq: Once | INTRAVENOUS | Status: AC
  Administered 2022-02-02: 150 mg via INTRAVENOUS
  Filled 2022-02-02: qty 150

## 2022-02-02 MED ORDER — PALONOSETRON HCL INJECTION 0.25 MG/5ML
0.2500 mg | Freq: Once | INTRAVENOUS | Status: AC
  Administered 2022-02-02: 0.25 mg via INTRAVENOUS
  Filled 2022-02-02: qty 5

## 2022-02-02 MED ORDER — SODIUM CHLORIDE 0.9 % IV SOLN
500.0000 mg/m2 | Freq: Once | INTRAVENOUS | Status: AC
  Administered 2022-02-02: 900 mg via INTRAVENOUS
  Filled 2022-02-02: qty 20

## 2022-02-02 MED ORDER — SODIUM CHLORIDE 0.9% FLUSH
10.0000 mL | INTRAVENOUS | Status: DC | PRN
  Administered 2022-02-02: 10 mL

## 2022-02-02 NOTE — Patient Instructions (Signed)
Pemetrexed Injection What is this medication? PEMETREXED (PEM e TREX ed) treats some types of cancer. It works by slowing down the growth of cancer cells. This medicine may be used for other purposes; ask your health care provider or pharmacist if you have questions. COMMON BRAND NAME(S): Alimta, PEMFEXY What should I tell my care team before I take this medication? They need to know if you have any of these conditions: Infection, such as chickenpox, cold sores, or herpes Kidney disease Low blood cell levels (white cells, red cells, and platelets) Lung or breathing disease, such as asthma Radiation therapy An unusual or allergic reaction to pemetrexed, other medications, foods, dyes, or preservatives If you or your partner are pregnant or trying to get pregnant Breast-feeding How should I use this medication? This medication is injected into a vein. It is given by your care team in a hospital or clinic setting. Talk to your care team about the use of this medication in children. Special care may be needed. Overdosage: If you think you have taken too much of this medicine contact a poison control center or emergency room at once. NOTE: This medicine is only for you. Do not share this medicine with others. What if I miss a dose? Keep appointments for follow-up doses. It is important not to miss your dose. Call your care team if you are unable to keep an appointment. What may interact with this medication? Do not take this medication with any of the following: Live virus vaccines This medication may also interact with the following: Ibuprofen This list may not describe all possible interactions. Give your health care provider a list of all the medicines, herbs, non-prescription drugs, or dietary supplements you use. Also tell them if you smoke, drink alcohol, or use illegal drugs. Some items may interact with your medicine. What should I watch for while using this medication? Your condition  will be monitored carefully while you are receiving this medication. This medication may make you feel generally unwell. This is not uncommon as chemotherapy can affect healthy cells as well as cancer cells. Report any side effects. Continue your course of treatment even though you feel ill unless your care team tells you to stop. This medication can cause serious side effects. To reduce the risk, your care team may give you other medications to take before receiving this one. Be sure to follow the directions from your care team. This medication can cause a rash or redness in areas of the body that have previously had radiation therapy. If you have had radiation therapy, tell your care team if you notice a rash in this area. This medication may increase your risk of getting an infection. Call your care team for advice if you get a fever, chills, sore throat, or other symptoms of a cold or flu. Do not treat yourself. Try to avoid being around people who are sick. Be careful brushing or flossing your teeth or using a toothpick because you may get an infection or bleed more easily. If you have any dental work done, tell your dentist you are receiving this medication. Avoid taking medications that contain aspirin, acetaminophen, ibuprofen, naproxen, or ketoprofen unless instructed by your care team. These medications may hide a fever. Check with your care team if you have severe diarrhea, nausea, and vomiting, or if you sweat a lot. The loss of too much body fluid may make it dangerous for you to take this medication. Talk to your care team if you or  your partner wish to become pregnant or think either of you might be pregnant. This medication can cause serious birth defects if taken during pregnancy and for 6 months after the last dose. A negative pregnancy test is required before starting this medication. A reliable form of contraception is recommended while taking this medication and for 6 months after the  last dose. Talk to your care team about reliable forms of contraception. Do not father a child while taking this medication and for 3 months after the last dose. Use a condom while having sex during this time period. Do not breastfeed while taking this medication and for 1 week after the last dose. This medication may cause infertility. Talk to your care team if you are concerned about your fertility. What side effects may I notice from receiving this medication? Side effects that you should report to your care team as soon as possible: Allergic reactions--skin rash, itching, hives, swelling of the face, lips, tongue, or throat Dry cough, shortness of breath or trouble breathing Infection--fever, chills, cough, sore throat, wounds that don't heal, pain or trouble when passing urine, general feeling of discomfort or being unwell Kidney injury--decrease in the amount of urine, swelling of the ankles, hands, or feet Low red blood cell level--unusual weakness or fatigue, dizziness, headache, trouble breathing Redness, blistering, peeling, or loosening of the skin, including inside the mouth Unusual bruising or bleeding Side effects that usually do not require medical attention (report to your care team if they continue or are bothersome): Fatigue Loss of appetite Nausea Vomiting This list may not describe all possible side effects. Call your doctor for medical advice about side effects. You may report side effects to FDA at 1-800-FDA-1088. Where should I keep my medication? This medication is given in a hospital or clinic. It will not be stored at home. NOTE: This sheet is a summary. It may not cover all possible information. If you have questions about this medicine, talk to your doctor, pharmacist, or health care provider.  2023 Elsevier/Gold Standard (2021-08-31 00:00:00) Cisplatin Injection What is this medication? CISPLATIN (SIS pla tin) treats some types of cancer. It works by slowing down  the growth of cancer cells. This medicine may be used for other purposes; ask your health care provider or pharmacist if you have questions. COMMON BRAND NAME(S): Platinol, Platinol -AQ What should I tell my care team before I take this medication? They need to know if you have any of these conditions: Eye disease, vision problems Hearing problems Kidney disease Low blood counts, such as low white cells, platelets, or red blood cells Tingling of the fingers or toes, or other nerve disorder An unusual or allergic reaction to cisplatin, carboplatin, oxaliplatin, other medications, foods, dyes, or preservatives If you or your partner are pregnant or trying to get pregnant Breast-feeding How should I use this medication? This medication is injected into a vein. It is given by your care team in a hospital or clinic setting. Talk to your care team about the use of this medication in children. Special care may be needed. Overdosage: If you think you have taken too much of this medicine contact a poison control center or emergency room at once. NOTE: This medicine is only for you. Do not share this medicine with others. What if I miss a dose? Keep appointments for follow-up doses. It is important not to miss your dose. Call your care team if you are unable to keep an appointment. What may interact with this  medication? Do not take this medication with any of the following: Live virus vaccines This medication may also interact with the following: Certain antibiotics, such as amikacin, gentamicin, neomycin, polymyxin B, streptomycin, tobramycin, vancomycin Foscarnet This list may not describe all possible interactions. Give your health care provider a list of all the medicines, herbs, non-prescription drugs, or dietary supplements you use. Also tell them if you smoke, drink alcohol, or use illegal drugs. Some items may interact with your medicine. What should I watch for while using this  medication? Your condition will be monitored carefully while you are receiving this medication. You may need blood work done while taking this medication. This medication may make you feel generally unwell. This is not uncommon, as chemotherapy can affect healthy cells as well as cancer cells. Report any side effects. Continue your course of treatment even though you feel ill unless your care team tells you to stop. This medication may increase your risk of getting an infection. Call your care team for advice if you get a fever, chills, sore throat, or other symptoms of a cold or flu. Do not treat yourself. Try to avoid being around people who are sick. Avoid taking medications that contain aspirin, acetaminophen, ibuprofen, naproxen, or ketoprofen unless instructed by your care team. These medications may hide a fever. This medication may increase your risk to bruise or bleed. Call your care team if you notice any unusual bleeding. Be careful brushing or flossing your teeth or using a toothpick because you may get an infection or bleed more easily. If you have any dental work done, tell your dentist you are receiving this medication. Drink fluids as directed while you are taking this medication. This will help protect your kidneys. Call your care team if you get diarrhea. Do not treat yourself. Talk to your care team if you or your partner wish to become pregnant or think you might be pregnant. This medication can cause serious birth defects if taken during pregnancy and for 14 months after the last dose. A negative pregnancy test is required before starting this medication. A reliable form of contraception is recommended while taking this medication and for 14 months after the last dose. Talk to your care team about effective forms of contraception. Do not father a child while taking this medication and for 11 months after the last dose. Use a condom during sex during this time period. Do not breast-feed  while taking this medication. This medication may cause infertility. Talk to your care team if you are concerned about your fertility. What side effects may I notice from receiving this medication? Side effects that you should report to your care team as soon as possible: Allergic reactions--skin rash, itching, hives, swelling of the face, lips, tongue, or throat Eye pain, change in vision, vision loss Hearing loss, ringing in ears Infection--fever, chills, cough, sore throat, wounds that don't heal, pain or trouble when passing urine, general feeling of discomfort or being unwell Kidney injury--decrease in the amount of urine, swelling of the ankles, hands, or feet Low red blood cell level--unusual weakness or fatigue, dizziness, headache, trouble breathing Painful swelling, warmth, or redness of the skin, blisters or sores at the infusion site Pain, tingling, or numbness in the hands or feet Unusual bruising or bleeding Side effects that usually do not require medical attention (report to your care team if they continue or are bothersome): Hair loss Nausea Vomiting This list may not describe all possible side effects. Call your doctor  for medical advice about side effects. You may report side effects to FDA at 1-800-FDA-1088. Where should I keep my medication? This medication is given in a hospital or clinic. It will not be stored at home. NOTE: This sheet is a summary. It may not cover all possible information. If you have questions about this medicine, talk to your doctor, pharmacist, or health care provider.  2023 Elsevier/Gold Standard (2021-08-24 00:00:00)

## 2022-02-03 ENCOUNTER — Telehealth: Payer: PPO

## 2022-02-08 NOTE — Progress Notes (Signed)
Sent in request for DOS 02/10/2022 to [employer].

## 2022-02-09 ENCOUNTER — Inpatient Hospital Stay: Payer: PPO | Attending: Oncology

## 2022-02-09 VITALS — BP 140/79 | HR 126 | Temp 97.7°F | Resp 20 | Ht 61.0 in | Wt 177.8 lb

## 2022-02-09 DIAGNOSIS — Z5189 Encounter for other specified aftercare: Secondary | ICD-10-CM | POA: Insufficient documentation

## 2022-02-09 DIAGNOSIS — N390 Urinary tract infection, site not specified: Secondary | ICD-10-CM | POA: Insufficient documentation

## 2022-02-09 DIAGNOSIS — C3432 Malignant neoplasm of lower lobe, left bronchus or lung: Secondary | ICD-10-CM | POA: Insufficient documentation

## 2022-02-09 MED ORDER — FILGRASTIM-SNDZ 480 MCG/0.8ML IJ SOSY
480.0000 ug | PREFILLED_SYRINGE | Freq: Once | INTRAMUSCULAR | Status: AC
  Administered 2022-02-09: 480 ug via SUBCUTANEOUS
  Filled 2022-02-09: qty 0.8

## 2022-02-09 NOTE — Patient Instructions (Signed)
TAKE A CLARITIN 1 PILL ONCE DAILY FOR 4 DAYS.  Filgrastim Injection What is this medication? FILGRASTIM (fil GRA stim) lowers the risk of infection in people who are receiving chemotherapy. It works by Building control surveyor make more white blood cells, which protects your body from infection. It may also be used to help people who have been exposed to high doses of radiation. It can be used to help prepare your body before a stem cell transplant. It works by helping your bone marrow make and release stem cells into the blood. This medicine may be used for other purposes; ask your health care provider or pharmacist if you have questions. COMMON BRAND NAME(S): Neupogen, Nivestym, Releuko, Zarxio What should I tell my care team before I take this medication? They need to know if you have any of these conditions: History of blood diseases, such as sickle cell anemia Kidney disease Recent or ongoing radiation An unusual or allergic reaction to filgrastim, pegfilgrastim, latex, rubber, other medications, foods, dyes, or preservatives Pregnant or trying to get pregnant Breast-feeding How should I use this medication? This medication is injected under the skin or into a vein. It is usually given by your care team in a hospital or clinic setting. It may be given at home. If you get this medication at home, you will be taught how to prepare and give it. Use exactly as directed. Take it as directed on the prescription label at the same time every day. Keep taking it unless your care team tells you to stop. It is important that you put your used needles and syringes in a special sharps container. Do not put them in a trash can. If you do not have a sharps container, call your pharmacist or care team to get one. This medication comes with INSTRUCTIONS FOR USE. Ask your pharmacist for directions on how to use this medication. Read the information carefully. Talk to your pharmacist or care team if you have  questions. Talk to your care team about the use of this medication in children. While it may be prescribed for children for selected conditions, precautions do apply. Overdosage: If you think you have taken too much of this medicine contact a poison control center or emergency room at once. NOTE: This medicine is only for you. Do not share this medicine with others. What if I miss a dose? It is important not to miss any doses. Talk to your care team about what to do if you miss a dose. What may interact with this medication? Medications that may cause a release of neutrophils, such as lithium This list may not describe all possible interactions. Give your health care provider a list of all the medicines, herbs, non-prescription drugs, or dietary supplements you use. Also tell them if you smoke, drink alcohol, or use illegal drugs. Some items may interact with your medicine. What should I watch for while using this medication? Your condition will be monitored carefully while you are receiving this medication. You may need bloodwork while taking this medication. Talk to your care team about your risk of cancer. You may be more at risk for certain types of cancer if you take this medication. What side effects may I notice from receiving this medication? Side effects that you should report to your care team as soon as possible: Allergic reactions--skin rash, itching, hives, swelling of the face, lips, tongue, or throat Capillary leak syndrome--stomach or muscle pain, unusual weakness or fatigue, feeling faint or lightheaded, decrease  in the amount of urine, swelling of the ankles, hands, or feet, trouble breathing High white blood cell level--fever, fatigue, trouble breathing, night sweats, change in vision, weight loss Inflammation of the aorta--fever, fatigue, back, chest, or stomach pain, severe headache Kidney injury (glomerulonephritis)--decrease in the amount of urine, red or dark brown urine,  foamy or bubbly urine, swelling of the ankles, hands, or feet Shortness of breath or trouble breathing Spleen injury--pain in upper left stomach or shoulder Unusual bruising or bleeding Side effects that usually do not require medical attention (report to your care team if they continue or are bothersome): Back pain Bone pain Fatigue Fever Headache Nausea This list may not describe all possible side effects. Call your doctor for medical advice about side effects. You may report side effects to FDA at 1-800-FDA-1088. Where should I keep my medication? Keep out of the reach of children and pets. Keep this medication in the original packaging until you are ready to take it. Protect from light. See product for storage information. Each product may have different instructions. Get rid of any unused medication after the expiration date. To get rid of medications that are no longer needed or have expired: Take the medication to a medications take-back program. Check with your pharmacy or law enforcement to find a location. If you cannot return the medication, ask your pharmacist or care team how to get rid of this medication safely. NOTE: This sheet is a summary. It may not cover all possible information. If you have questions about this medicine, talk to your doctor, pharmacist, or health care provider.  2023 Elsevier/Gold Standard (2021-08-04 00:00:00)

## 2022-02-09 NOTE — Progress Notes (Signed)
Wrong patient for the pretreatment charting.

## 2022-02-10 ENCOUNTER — Inpatient Hospital Stay: Payer: PPO

## 2022-02-10 VITALS — BP 122/78 | HR 120 | Temp 98.6°F | Resp 18 | Ht 61.0 in | Wt 177.0 lb

## 2022-02-10 DIAGNOSIS — Z5189 Encounter for other specified aftercare: Secondary | ICD-10-CM | POA: Diagnosis not present

## 2022-02-10 DIAGNOSIS — C3432 Malignant neoplasm of lower lobe, left bronchus or lung: Secondary | ICD-10-CM

## 2022-02-10 MED ORDER — FILGRASTIM-SNDZ 480 MCG/0.8ML IJ SOSY
480.0000 ug | PREFILLED_SYRINGE | Freq: Once | INTRAMUSCULAR | Status: AC
  Administered 2022-02-10: 480 ug via SUBCUTANEOUS
  Filled 2022-02-10: qty 0.8

## 2022-02-10 NOTE — Patient Instructions (Signed)
Filgrastim Injection What is this medication? FILGRASTIM (fil GRA stim) lowers the risk of infection in people who are receiving chemotherapy. It works by Building control surveyor make more white blood cells, which protects your body from infection. It may also be used to help people who have been exposed to high doses of radiation. It can be used to help prepare your body before a stem cell transplant. It works by helping your bone marrow make and release stem cells into the blood. This medicine may be used for other purposes; ask your health care provider or pharmacist if you have questions. COMMON BRAND NAME(S): Neupogen, Nivestym, Releuko, Zarxio What should I tell my care team before I take this medication? They need to know if you have any of these conditions: History of blood diseases, such as sickle cell anemia Kidney disease Recent or ongoing radiation An unusual or allergic reaction to filgrastim, pegfilgrastim, latex, rubber, other medications, foods, dyes, or preservatives Pregnant or trying to get pregnant Breast-feeding How should I use this medication? This medication is injected under the skin or into a vein. It is usually given by your care team in a hospital or clinic setting. It may be given at home. If you get this medication at home, you will be taught how to prepare and give it. Use exactly as directed. Take it as directed on the prescription label at the same time every day. Keep taking it unless your care team tells you to stop. It is important that you put your used needles and syringes in a special sharps container. Do not put them in a trash can. If you do not have a sharps container, call your pharmacist or care team to get one. This medication comes with INSTRUCTIONS FOR USE. Ask your pharmacist for directions on how to use this medication. Read the information carefully. Talk to your pharmacist or care team if you have questions. Talk to your care team about the use of this  medication in children. While it may be prescribed for children for selected conditions, precautions do apply. Overdosage: If you think you have taken too much of this medicine contact a poison control center or emergency room at once. NOTE: This medicine is only for you. Do not share this medicine with others. What if I miss a dose? It is important not to miss any doses. Talk to your care team about what to do if you miss a dose. What may interact with this medication? Medications that may cause a release of neutrophils, such as lithium This list may not describe all possible interactions. Give your health care provider a list of all the medicines, herbs, non-prescription drugs, or dietary supplements you use. Also tell them if you smoke, drink alcohol, or use illegal drugs. Some items may interact with your medicine. What should I watch for while using this medication? Your condition will be monitored carefully while you are receiving this medication. You may need bloodwork while taking this medication. Talk to your care team about your risk of cancer. You may be more at risk for certain types of cancer if you take this medication. What side effects may I notice from receiving this medication? Side effects that you should report to your care team as soon as possible: Allergic reactions--skin rash, itching, hives, swelling of the face, lips, tongue, or throat Capillary leak syndrome--stomach or muscle pain, unusual weakness or fatigue, feeling faint or lightheaded, decrease in the amount of urine, swelling of the ankles, hands, or  feet, trouble breathing High white blood cell level--fever, fatigue, trouble breathing, night sweats, change in vision, weight loss Inflammation of the aorta--fever, fatigue, back, chest, or stomach pain, severe headache Kidney injury (glomerulonephritis)--decrease in the amount of urine, red or dark brown urine, foamy or bubbly urine, swelling of the ankles, hands, or  feet Shortness of breath or trouble breathing Spleen injury--pain in upper left stomach or shoulder Unusual bruising or bleeding Side effects that usually do not require medical attention (report to your care team if they continue or are bothersome): Back pain Bone pain Fatigue Fever Headache Nausea This list may not describe all possible side effects. Call your doctor for medical advice about side effects. You may report side effects to FDA at 1-800-FDA-1088. Where should I keep my medication? Keep out of the reach of children and pets. Keep this medication in the original packaging until you are ready to take it. Protect from light. See product for storage information. Each product may have different instructions. Get rid of any unused medication after the expiration date. To get rid of medications that are no longer needed or have expired: Take the medication to a medications take-back program. Check with your pharmacy or law enforcement to find a location. If you cannot return the medication, ask your pharmacist or care team how to get rid of this medication safely. NOTE: This sheet is a summary. It may not cover all possible information. If you have questions about this medicine, talk to your doctor, pharmacist, or health care provider.  2023 [employer]vier/Gold Standard (2021-08-04 00:00:00)

## 2022-02-26 ENCOUNTER — Other Ambulatory Visit: Payer: Self-pay | Admitting: Physician Assistant

## 2022-03-02 ENCOUNTER — Other Ambulatory Visit: Payer: Self-pay | Admitting: Physician Assistant

## 2022-03-02 MED ORDER — LORAZEPAM 1 MG PO TABS: ORAL_TABLET | ORAL | 0 refills | Status: DC

## 2022-03-02 MED ORDER — VITAMIN D (ERGOCALCIFEROL) 1.25 MG (50000 UNIT) PO CAPS: 50000.0000 [IU] | ORAL_CAPSULE | ORAL | 1 refills | Status: DC

## 2022-03-03 ENCOUNTER — Ambulatory Visit (INDEPENDENT_AMBULATORY_CARE_PROVIDER_SITE_OTHER): Payer: PPO

## 2022-03-03 NOTE — Progress Notes (Signed)
Chronic Care Management Pharmacy Note  03/03/2022 Name:  Natalie Perry MRN:  [id number] DOB:  1960/11/30  Summary: -Pleasant 61 year old female presents for f/u CCM visit. She was very tired today from recent Chemo  Recommendations/Changes made from today's visit: -BP was elevated. Will have patient write down numbers daily and have CCM team reach out next week  Subjective: Natalie Perry is an 61 y.o. year old female who is a primary patient of [name], [place].  The CCM team was consulted for assistance with disease management and care coordination needs.    Engaged with patient by telephone for follow up visit in response to provider referral for pharmacy case management and/or care coordination services.   Consent to Services:  The patient was given the following information about Chronic Care Management services today, agreed to services, and gave verbal consent: 1. CCM service includes personalized support from designated clinical staff supervised by the primary care provider, including individualized plan of care and coordination with other care providers 2. 24/7 contact phone numbers for assistance for urgent and routine care needs. 3. Service will only be billed when office clinical staff spend 20 minutes or more in a month to coordinate care. 4. Only one practitioner may furnish and bill the service in a calendar month. 5.The patient may stop CCM services at any time (effective at the end of the month) by phone call to the office staff. 6. The patient will be responsible for cost sharing (co-pay) of up to 20% of the service fee (after annual deductible is met). Patient agreed to services and consent obtained.  Patient Care Team: [name], PA-C as PCP - General (Physician Assistant) [name], MD as Consulting Physician (Oncology) [name], MD as Referring Physician (Pulmonary Disease) Lane Hacker, [facility] (Pharmacist)  Recent office visits:  12/28/21  [name] PA-C. Seen for HTN. Referral to Cardiology. D/C Ondansetron HCI 2m, Pregabalin 554m Prochlorperazine 1039m   Recent consult visits:  12/17/21 (Oncology) LewLavera Guise. Seen for follow up. No med changes.   12/08/21 (Surgical) Liminger, MicClayburn Pert. Seen for post op. No med changes.    12/02/21 (Oncology) ParDayton Scrape. Orders Only. Ordered Decadron 4mg63mke 5 tabs at the night before and 5 tab the morning of chemotherapy, every 3 weeks, by mouth.   11/23/21 (Surgical) Liminger, MichClayburn Pert Seen for port placement. No med changes.   Hospital visits:  None   Objective:  Lab Results  Component Value Date   CREATININE 0.9 01/29/2022   BUN 18 01/29/2022   EGFR 58 (L) 12/28/2021   GFRNONAA >60 09/04/2021   GFRAA 105 04/23/2020   NA 136 (A) 01/29/2022   K 3.9 01/29/2022   CALCIUM 8.6 (A) 01/29/2022   CO2 25 (A) 01/29/2022   GLUCOSE 140 (H) 12/28/2021    Lab Results  Component Value Date/Time   HGBA1C 5.3 12/28/2021 10:54 AM    Last diabetic Eye exam: No results found for: "HMDIABEYEEXA"  Last diabetic Foot exam: No results found for: "HMDIABFOOTEX"   Lab Results  Component Value Date   CHOL 101 12/28/2021   HDL 29 (L) 12/28/2021   LDLCALC 57 12/28/2021   TRIG 72 12/28/2021   CHOLHDL 3.5 12/28/2021       Latest Ref Rng & Units 01/29/2022   12:00 AM 01/12/2022   12:00 AM 01/07/2022   12:00 AM  Hepatic Function  Albumin 3.5 - 5.0 3.6     3.7  3.7      AST 13 - 35 42     36     38      ALT 7 - 35 U/L _0 Alk Phosphatase 25 - 125 70     81     67         This result is from an external source.    Lab Results  Component Value Date/Time   TSH 5.470 (H) 12/28/2021 10:54 AM   TSH 2.090 08/26/2021 11:09 AM       Latest Ref Rng & Units 01/29/2022   12:00 AM 01/12/2022   12:00 AM 01/07/2022   12:00 AM  CBC  WBC  2.4     5.6     2.2      Hemoglobin 12.0 - 16.0 8.7     10.3     10.8      Hematocrit 36 - 46 26     31      32      Platelets 150 - 400 K/uL 150     118     134         This result is from an external source.    Lab Results  Component Value Date/Time   VD25OH 52.8 12/28/2021 10:54 AM   VD25OH 47.8 08/26/2021 11:09 AM    Clinical ASCVD: Yes  The ASCVD Risk score (Arnett DK, et al., 2019) failed to calculate for the following reasons:   The valid total cholesterol range is 130 to 320 mg/dL       07/23/2021   10:28 AM 01/07/2021   11:13 AM 05/22/2020    2:32 PM  Depression screen PHQ 2/9  Decreased Interest 0 0 0  Down, Depressed, Hopeless 0 0 0  PHQ - 2 Score 0 0 0     Other: (CHADS2VASc if Afib, MMRC or CAT for COPD, ACT, DEXA)  Social History   Tobacco Use  Smoking Status Never  Smokeless Tobacco Never   BP Readings from Last 3 Encounters:  02/10/22 122/78  02/09/22 (!) 140/79  02/02/22 113/72   Pulse Readings from Last 3 Encounters:  02/10/22 (!) 120  02/09/22 (!) 126  02/02/22 (!) 126   Wt Readings from Last 3 Encounters:  02/10/22 177 lb (80.3 kg)  02/09/22 177 lb 12 oz (80.6 kg)  02/02/22 186 lb (84.4 kg)   BMI Readings from Last 3 Encounters:  02/10/22 33.44 kg/m  02/09/22 33.59 kg/m  02/02/22 35.14 kg/m    Assessment/Interventions: Review of patient past medical history, allergies, medications, health status, including review of consultants reports, laboratory and other test data, was performed as part of comprehensive evaluation and provision of chronic care management services.   SDOH:  (Social Determinants of Health) assessments and interventions performed: Yes SDOH Interventions    Flowsheet Row Chronic Care Management from 03/03/2022 in [place] from 07/22/2021 in [place] Interventions    Housing Interventions -- Intervention Not Indicated  Transportation Interventions Intervention Not Indicated --  Financial Strain Interventions Intervention Not Indicated --  Physical Activity Interventions -- Local  [employer]      SDOH Screenings   Food Insecurity: No Food Insecurity (05/22/2020)  Housing: Low Risk  (07/23/2021)  Transportation Needs: No Transportation Needs (03/03/2022)  Alcohol Screen: Low Risk  (07/23/2021)  Depression (PHQ2-9): Low Risk  (07/23/2021)  Financial Resource Strain: Low  Risk  (03/03/2022)  Physical Activity: Insufficiently Active (07/23/2021)  Tobacco Use: Low Risk  (02/10/2022)    CCM Care Plan  Allergies  Allergen Reactions   Penicillins Hives, Swelling, Rash and Other (See Comments)   Nicotine Rash and Other (See Comments)    Skin turns red/rash    Medications Reviewed Today     Reviewed by Lane Hacker, [facility] (Pharmacist) on 03/03/22 at 1327  Med List Status: <None>   Medication Order Taking? Sig Documenting Provider Last Dose Status Informant  acetaminophen (TYLENOL) 500 MG tablet [id number] No Take 500 mg by mouth every 6 (six) hours as needed (pain.). [provider] Taking Active Self  albuterol (PROVENTIL) (2.5 MG/3ML) 0.083% nebulizer solution [id number] No INHALE 1 VIAL VIA NEBULIZER 3 TO 4 TIMES A DAY AS DIRECTED [name], PA-C Taking Active Self  albuterol (VENTOLIN HFA) 108 (90 Base) MCG/ACT inhaler [id number]  INHALE 1-2 PUFFS BY MOUTH into THE lungs every 4-6 hours AS NEEDED [name], PA-C  Active   amLODipine (NORVASC) 5 MG tablet [id number]  Take 1 tablet (5 mg total) by mouth daily. [name], PA-C  Active   apixaban (ELIQUIS) 5 MG TABS tablet [id number]  Take 1 tablet by mouth twice daily Cox, Kirsten, MD  Active   betamethasone dipropionate (DIPROLENE) 0.05 % ointment [id number] No Apply 1 application. topically daily as needed (skin irritation.). [provider] Taking Active Self  dexamethasone (DECADRON) 4 MG tablet [id number] No Take 5 tabs at the night before and 5 tab the morning of chemotherapy, every 3 weeks, by mouth Melodye Ped, NP Taking Active   folic acid (FOLVITE) 1 MG tablet [id number] No Take 1 tablet (1  mg total) by mouth daily. [place] Scrape A, NP Taking Active   halobetasol (ULTRAVATE) 0.05 % cream [id number]  Apply topically. [provider]  Active   levothyroxine (SYNTHROID) 100 MCG tablet [id number]  TAKE ONE TABLET BY MOUTH EVERY MORNING Cox, Kirsten, MD  Active   loratadine (CLARITIN) 10 MG tablet [id number] No Take by mouth. [provider] Taking Active   LORazepam (ATIVAN) 1 MG tablet [id number]  TAKE 1 TABLET(1 MG) BY MOUTH THREE TIMES DAILY AS NEEDED FOR ANXIETY [name], PA-C  Active   losartan-hydrochlorothiazide (HYZAAR) 100-25 MG tablet [id number]  Take 1 tablet by mouth daily. Cox, Kirsten, MD  Active   oxyCODONE (OXY IR/ROXICODONE) 5 MG immediate release tablet [id number]  Take 1 tablet (5 mg total) by mouth 3 (three) times daily as needed. [name], PA-C  Active   Polyethyl Glycol-Propyl Glycol (LUBRICANT EYE DROPS) 0.4-0.3 % SOLN [id number] No Place 1-2 drops into both eyes 3 (three) times daily as needed (dry/irritated eyes.). [provider] Taking Active Self  potassium chloride SA (KLOR-CON M) 20 MEQ tablet [id number]  Take 2 tablets (40 mEq total) by mouth 2 (two) times daily. Cox, Kirsten, MD  Active   promethazine (PHENERGAN) 25 MG tablet [id number]  Take 1 tablet (25 mg total) by mouth every 8 (eight) hours as needed for nausea or vomiting. Eliot Ford  Active   SYMBICORT 160-4.5 MCG/ACT inhaler [id number] No Inhale 2 puffs into the lungs 2 (two) times daily.  Patient taking differently: Inhale 1 puff into the lungs 2 (two) times daily.   [name], PA-C Taking Active Self  Vitamin D, Ergocalciferol, (DRISDOL) 1.25 MG (50000 UNIT) CAPS capsule [id number]  Take 1 capsule (50,000 Units total) by mouth every 7 (seven) days. [name], PA-C  Active  Patient Active Problem List   Diagnosis Date Noted   Drug-induced neutropenia ([place]) 01/08/2022   Elevated serum creatinine 01/07/2022   Lung cancer, lower lobe ([place])  10/04/2021   S/P lobectomy of lung 08/31/2021   Multiple subsegmental pulmonary emboli without acute cor pulmonale (HCC) 11/18/2020   Lung nodule 11/18/2020   Abnormal laboratory test 11/18/2020   Hypoxia 10/31/2020   COPD with acute exacerbation ([place]) 07/30/2020   Chronic pain of both knees 07/30/2020   Visit for screening mammogram 04/23/2020   Polyuria 01/16/2020   Need for prophylactic vaccination and inoculation against influenza 01/16/2020   Need for prophylactic vaccination against Streptococcus pneumoniae (pneumococcus) 01/16/2020   Moderate persistent asthma without complication 35/46/5681   Herpes zoster without complication 27/51/7001   Vitamin D deficiency 07/02/2019   Thrombocytopenia ([place]) 07/05/2016   History of hepatitis C 07/05/2016   Hypothyroid 10/17/2012   GERD (gastroesophageal reflux disease) 10/17/2012   Hepatic cirrhosis ([place]) 10/16/2012   Chronic hepatitis C virus infection ([place]) 10/16/2012   Allergic rhinitis 07/31/2008   PULMONARY NODULE 06/21/2007   DYSPNEA 06/06/2007   COUGH 06/06/2007   Benign hypertension 05/25/2007   ASTHMA 05/25/2007   Asthma 05/25/2007    Immunization History  Administered Date(s) Administered   Influenza Inj Mdck Quad Pf 01/16/2020, 01/07/2021   Influenza-Unspecified 02/08/2019   PFIZER(Purple Top)SARS-COV-2 Vaccination 08/10/2019, 08/31/2019, 06/26/2020   Pneumococcal Conjugate-13 03/18/2014   Pneumococcal Polysaccharide-23 01/16/2020   Pneumococcal-Unspecified 04/06/2011   Tdap 01/31/2012    Conditions to be addressed/monitored:  Hypertension, Diabetes, and COPD  Care Plan : [place]  Updates made by Lane Hacker, [place] since 03/03/2022 12:00 AM     Problem: Disease State Management   Priority: High     Long-Range Goal: BP, Hypothyroid, COPD   Start Date: 03/03/2022  Expected End Date: 03/03/2023  This Visit's Progress: On track  Priority: High  Note:   Current Barriers:  Does not contact  provider office for questions/concerns  Pharmacist Clinical Goal(s):  Patient will verbalize ability to afford treatment regimen through collaboration with PharmD and provider.   Interventions: 1:1 collaboration with [name], PA-C regarding development and update of comprehensive plan of care as evidenced by provider attestation and co-signature Inter-disciplinary care team collaboration (see longitudinal plan of care) Comprehensive medication review performed; medication list updated in electronic medical record  Hypertension (BP goal <130/80) -Controlled -Current treatment: Amlodipine 83m Appropriate, Effective, Safe, Accessible Losartan/HCTZ 100/25 Appropriate, Effective, Safe, Accessible -Medications previously tried: N/A  -Current home readings:  October 2023: Tests daily but doesn't write down 140/79 -Current dietary habits: "Tries to eat healthy" -Current exercise habits: N/A -Denies hypotensive/hypertensive symptoms -Educated on BP goals and benefits of medications for prevention of heart attack, stroke and kidney damage; -Counseled to monitor BP at home daily, document, and provide log at future appointments October 2023: Take BP and write down daily. Will have CCM team reach out in one week for numbers  Thyroid (Goal TSH: 0.4-4.5 Lab Results  Component Value Date   TSH 5.470 (H) 12/28/2021  -Is patient taking Biotin supplement No  -Not ideally controlled -Current treatment: Levothyroxine 1058m Appropriate, Effective, Safe, Accessible -Counseled to take medication on an empty stomach -Recommended to continue current medication   COPD (Goal: control symptoms and prevent exacerbations) -Pt with recent history of left lung adenocarcinoma and had LLL lobectomy 4/23.  She is currently following with Dr LeBobby Rumpfeceiving chemotherapy every 3 weeks.  Her last treatment was last week. -Not ideally controlled -Current treatment  Albuterol Appropriate, Effective, Safe,  Accessible Symbicort 160 Appropriate, Effective, Safe, Accessible -Medications previously tried: N/A  -Gold Grade: Gold 2 (FEV1 50-79%) -Current COPD Classification:  D (high sx, >/=2 exacerbations/yr) -MMRC/CAT score:      No data to display        -Pulmonary function testing:  TLC  Date Value Ref Range Status  08/25/2021 3.83 L Final   PF Readings from Last 3 Encounters:  No data found for PF  08/25/21: FVC-Pre L 1.88  FVC-%Pred-Pre % 59  FVC-Post L 1.85  FVC-%Pred-Post % 58  FVC-%Change-Post % -1  FEV1-Pre L 1.56  FEV1-%Pred-Pre % 64  FEV1-Post L 1.55  FEV1-%Pred-Post % 63  FEV1-%Change-Post % -1  FEV6-Pre L 1.88  FEV6-%Pred-Pre % 61  FEV6-Post L 1.85  FEV6-%Pred-Post % 60  FEV6-%Change-Post % -2  Pre FEV1/FVC ratio % 83  FEV1FVC-%Pred-Pre % 106  Post FEV1/FVC ratio % 83  FEV1FVC-%Change-Post % 0  Pre FEV6/FVC Ratio % 100  -Counseled on Proper inhaler technique; -Recommended to continue current medication  Patient Goals/Self-Care Activities Patient will:  - take medications as prescribed as evidenced by patient report and record review  Follow Up Plan: The patient has been provided with contact information for the care management team and has been advised to call with any health related questions or concerns.   CPP F/U PRN  [place] Constable, Pharm.D. - [phone]        Medication Assistance: None required.  Patient affirms current coverage meets needs.   Patient's preferred pharmacy is:  [facility] - [place], [place] - [address] [place] [address] Phone: [phone] Fax: [facility], [place] - [address] AT [place] [address] [place] [address] Phone: [phone] Fax: [phone]  [facility] [address] [place] [address] Phone: [phone] Fax: [phone]  Uses pill  box? Yes Pt endorses 100% compliance -[facility] patient  Care Plan and Follow Up Patient Decision:  Patient agrees to Care Plan and Follow-up.  Plan: The patient has been provided with contact information for the care management team and has been advised to call with any health related questions or concerns.   CPP F/U PRN   [place] Constable, Pharm.D. - [phone]

## 2022-03-03 NOTE — Patient Instructions (Signed)
Visit Information   Goals Addressed   None    Patient Care Plan: CCM Pharmacy Care Plan     Problem Identified: Disease State Management   Priority: High     Long-Range Goal: BP, Hypothyroid, COPD   Start Date: 03/03/2022  Expected End Date: 03/03/2023  This Visit's Progress: On track  Priority: High  Note:   Current Barriers:  Does not contact provider office for questions/concerns  Pharmacist Clinical Goal(s):  Patient will verbalize ability to afford treatment regimen through collaboration with PharmD and provider.   Interventions: 1:1 collaboration with [name], PA-C regarding development and update of comprehensive plan of care as evidenced by provider attestation and co-signature Inter-disciplinary care team collaboration (see longitudinal plan of care) Comprehensive medication review performed; medication list updated in electronic medical record  Hypertension (BP goal <130/80) -Controlled -Current treatment: Amlodipine 5mg  Appropriate, Effective, Safe, Accessible Losartan/HCTZ 100/25 Appropriate, Effective, Safe, Accessible -Medications previously tried: N/A  -Current home readings:  October 2023: Tests daily but doesn't write down 140/79 -Current dietary habits: "Tries to eat healthy" -Current exercise habits: N/A -Denies hypotensive/hypertensive symptoms -Educated on BP goals and benefits of medications for prevention of heart attack, stroke and kidney damage; -Counseled to monitor BP at home daily, document, and provide log at future appointments October 2023: Take BP and write down daily. Will have CCM team reach out in one week for numbers  Thyroid (Goal TSH: 0.4-4.5 Lab Results  Component Value Date   TSH 5.470 (H) 12/28/2021  -Is patient taking Biotin supplement No  -Not ideally controlled -Current treatment: Levothyroxine 181mcg Appropriate, Effective, Safe, Accessible -Counseled to take medication on an empty stomach -Recommended to  continue current medication   COPD (Goal: control symptoms and prevent exacerbations) -Pt with recent history of left lung adenocarcinoma and had LLL lobectomy 4/23.  She is currently following with Dr Bobby Rumpf receiving chemotherapy every 3 weeks.  Her last treatment was last week. -Not ideally controlled -Current treatment  Albuterol Appropriate, Effective, Safe, Accessible Symbicort 160 Appropriate, Effective, Safe, Accessible -Medications previously tried: N/A  -Gold Grade: Gold 2 (FEV1 50-79%) -Current COPD Classification:  D (high sx, >/=2 exacerbations/yr) -MMRC/CAT score:      No data to display        -Pulmonary function testing:  TLC  Date Value Ref Range Status  08/25/2021 3.83 L Final   PF Readings from Last 3 Encounters:  No data found for PF  08/25/21: FVC-Pre L 1.88  FVC-%Pred-Pre % 59  FVC-Post L 1.85  FVC-%Pred-Post % 58  FVC-%Change-Post % -1  FEV1-Pre L 1.56  FEV1-%Pred-Pre % 64  FEV1-Post L 1.55  FEV1-%Pred-Post % 63  FEV1-%Change-Post % -1  FEV6-Pre L 1.88  FEV6-%Pred-Pre % 61  FEV6-Post L 1.85  FEV6-%Pred-Post % 60  FEV6-%Change-Post % -2  Pre FEV1/FVC ratio % 83  FEV1FVC-%Pred-Pre % 106  Post FEV1/FVC ratio % 83  FEV1FVC-%Change-Post % 0  Pre FEV6/FVC Ratio % 100  -Counseled on Proper inhaler technique; -Recommended to continue current medication  Patient Goals/Self-Care Activities Patient will:  - take medications as prescribed as evidenced by patient report and record review  Follow Up Plan: The patient has been provided with contact information for the care management team and has been advised to call with any health related questions or concerns.   CPP F/U PRN  [place] Constable, Pharm.D. - [phone]       Ms. Heiner was given information about Chronic Care Management services today including:  CCM service includes personalized support  from designated clinical staff supervised by her physician, including individualized plan of  care and coordination with other care providers 24/7 contact phone numbers for assistance for urgent and routine care needs. Standard insurance, coinsurance, copays and deductibles apply for chronic care management only during months in which we provide at least 20 minutes of these services. Most insurances cover these services at 100%, however patients may be responsible for any copay, coinsurance and/or deductible if applicable. This service may help you avoid the need for more expensive face-to-face services. Only one practitioner may furnish and bill the service in a calendar month. The patient may stop CCM services at any time (effective at the end of the month) by phone call to the office staff.  Patient agreed to services and verbal consent obtained.   The patient verbalized understanding of instructions, educational materials, and care plan provided today and DECLINED offer to receive copy of patient instructions, educational materials, and care plan.  The pharmacy team will reach out to the patient again over the next 60 days.   Lane Hacker, [place]

## 2022-03-08 DIAGNOSIS — C3432 Malignant neoplasm of lower lobe, left bronchus or lung: Secondary | ICD-10-CM | POA: Diagnosis not present

## 2022-03-08 DIAGNOSIS — C3490 Malignant neoplasm of unspecified part of unspecified bronchus or lung: Secondary | ICD-10-CM | POA: Diagnosis not present

## 2022-03-08 LAB — HEPATIC FUNCTION PANEL
ALT: 22 U/L (ref 7–35)
AST: 45 — AB (ref 13–35)
Alkaline Phosphatase: 61 (ref 25–125)
Bilirubin, Total: 1.2

## 2022-03-08 LAB — COMPREHENSIVE METABOLIC PANEL
Albumin: 4 (ref 3.5–5.0)
Calcium: 9.1 (ref 8.7–10.7)

## 2022-03-08 LAB — BASIC METABOLIC PANEL
BUN: 36 — AB (ref 4–21)
CO2: 24 — AB (ref 13–22)
Chloride: 107 (ref 99–108)
Creatinine: 1.1 (ref 0.5–1.1)
Glucose: 127
Potassium: 3.8 mEq/L (ref 3.5–5.1)
Sodium: 139 (ref 137–147)

## 2022-03-08 LAB — CBC AND DIFFERENTIAL
HCT: 24 — AB (ref 36–46)
Hemoglobin: 8.4 — AB (ref 12.0–16.0)
Neutrophils Absolute: 8.8
Platelets: 84 10*3/uL — AB (ref 150–400)
WBC: 10

## 2022-03-08 LAB — CBC: RBC: 2.48 — AB (ref 3.87–5.11)

## 2022-03-08 NOTE — Progress Notes (Signed)
[facility]  [address] [place],  [place]  [address] [phone]  Clinic Day:  01/29/2022  Referring physician: [name], PA-C  HISTORY OF PRESENT ILLNESS:  The patient is a 61 y.o. female will stage IIB (T1N1 M0) lung adenocarcinoma, status post a left lower lobe lobectomy in April 2023.  With respect to her left lower lobectomy in April 2023, her mass measured 2.1 cm with 1/17 lymph nodes being positive for disease.  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  She comes in today to be evaluated before heading into her 4th and final cycle of cisplatin/pemetrexed.  The patient claims to have tolerated her 3rd cycle of chemotherapy okay.  She states she has become progressively fatigued after each successive cycle of adjuvant chemotherapy.  However, she continues to deny having any respiratory symptoms or other findings which concern her for early disease recurrence.   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  Labs dating back to 2011 have shown her platelet count consistently around 100K.  As she has not been severely thrombocytopenic, her platelets have been followed conservatively.    PHYSICAL EXAM:  There were no vitals taken for this visit. Wt Readings from Last 3 Encounters:  02/10/22 177 lb (80.3 kg)  02/09/22 177 lb 12 oz (80.6 kg)  02/02/22 186 lb (84.4 kg)   There is no height or weight on file to calculate BMI. Performance status (ECOG): 1 - Symptomatic but completely ambulatory Physical Exam Constitutional:      Appearance: Normal appearance. She is not ill-appearing.  HENT:     Mouth/Throat:     Mouth: Mucous membranes are moist.     Pharynx: Oropharynx is clear. No oropharyngeal exudate or posterior oropharyngeal erythema.  Cardiovascular:     Rate and Rhythm: Normal rate and regular rhythm.     Heart  sounds: No murmur heard.    No friction rub. No gallop.  Pulmonary:     Effort: Pulmonary effort is normal. No respiratory distress.     Breath sounds: Normal breath sounds. No wheezing, rhonchi or rales.  Abdominal:     General: Bowel sounds are normal. There is no distension.     Palpations: Abdomen is soft. There is no mass.     Tenderness: There is no abdominal tenderness.  Musculoskeletal:        General: No swelling.     Right lower leg: No edema.     Left lower leg: No edema.  Lymphadenopathy:     Cervical: No cervical adenopathy.     Upper Body:     Right upper body: No supraclavicular or axillary adenopathy.     Left upper body: No supraclavicular or axillary adenopathy.     Lower Body: No right inguinal adenopathy. No left inguinal adenopathy.  Skin:    General: Skin is warm.     Coloration: Skin is not jaundiced.     Findings: No erythema, lesion or rash.     Comments:    Neurological:     General: No focal deficit present.     Mental Status: She is alert and oriented to person, place, and time. Mental status is at baseline.  Psychiatric:        Mood and Affect: Mood normal.        Behavior: Behavior normal.  Thought Content: Thought content normal.  LABS:  Latest Reference Range & Units 01/29/22 00:00  Sodium 137 - 147  136 ! (E)  Potassium 3.5 - 5.1 mEq/L 3.9 (E)  Chloride 99 - 108  105 (E)  CO2 13 - 22  25 ! (E)  Glucose  115 (E)  BUN 4 - 21  18 (E)  Creatinine 0.5 - 1.1  0.9 (E)  Calcium 8.7 - 10.7  8.6 ! (E)  Alkaline Phosphatase 25 - 125  70 (E)  Albumin 3.5 - 5.0  3.6 (E)  AST 13 - 35  42 ! (E)  ALT 7 - 35 U/L 23 (E)  Bilirubin, Total  0.8 (E)  !: Data is abnormal (E): External lab result  Latest Reference Range & Units 01/29/22 00:00  WBC  2.4 (E)  RBC 3.87 - 5.11  2.94 ! (E)  Hemoglobin 12.0 - 16.0  8.7 ! (E)  HCT 36 - 46  26 ! (E)  Platelets 150 - 400 K/uL 150 (E)  NEUT#  1.27 (E)  !: Data is abnormal (E): External lab  result  ASSESSMENT & PLAN:  Assessment/Plan:  A 61 y.o. female with stage IIB (T1 N1 M0) lung adenocarcinoma, status post a left lower lobe lobectomy in April 2023.  She will proceed with her 4th and final cycle of adjuvant cisplatin/pemetrexed early next week.  Due to her low white count, I will give her white cell shot therapy to prevent severe neutropenia from potentially causing an infection over these next few weeks.  As this is her last cycle of adjuvant chemotherapy, the patient understands her peripheral counts should improve over time after she recuperates from her fourth treatment.  I will see this patient back in 6 weeks for repeat clinical assessment.  A chest CT will be done a day before her next visit to ascertain her new disease baseline after the completion of her adjuvant chemotherapy.  The patient understands all the plans discussed today and is in agreement with them.  Diem Dicocco Macarthur Critchley, MD

## 2022-03-09 ENCOUNTER — Inpatient Hospital Stay: Payer: PPO

## 2022-03-09 ENCOUNTER — Other Ambulatory Visit: Payer: Self-pay | Admitting: Oncology

## 2022-03-09 ENCOUNTER — Inpatient Hospital Stay (INDEPENDENT_AMBULATORY_CARE_PROVIDER_SITE_OTHER): Payer: PPO | Admitting: Oncology

## 2022-03-09 DIAGNOSIS — J449 Chronic obstructive pulmonary disease, unspecified: Secondary | ICD-10-CM | POA: Diagnosis not present

## 2022-03-09 DIAGNOSIS — C3432 Malignant neoplasm of lower lobe, left bronchus or lung: Secondary | ICD-10-CM

## 2022-03-09 DIAGNOSIS — N390 Urinary tract infection, site not specified: Secondary | ICD-10-CM | POA: Diagnosis not present

## 2022-03-09 DIAGNOSIS — I10 Essential (primary) hypertension: Secondary | ICD-10-CM

## 2022-03-09 DIAGNOSIS — Z5189 Encounter for other specified aftercare: Secondary | ICD-10-CM | POA: Diagnosis not present

## 2022-03-09 LAB — URINALYSIS, COMPLETE (UACMP) WITH MICROSCOPIC
Bilirubin Urine: NEGATIVE
Glucose, UA: NEGATIVE mg/dL
Hgb urine dipstick: NEGATIVE
Ketones, ur: NEGATIVE mg/dL
Nitrite: NEGATIVE
Protein, ur: 30 mg/dL — AB
Specific Gravity, Urine: 1.021 (ref 1.005–1.030)
WBC, UA: >50 WBC/hpf — ABNORMAL HIGH (ref 0–5)
pH: 5 (ref 5.0–8.0)

## 2022-03-09 MED ORDER — CIPROFLOXACIN HCL 500 MG PO TABS: 500.0000 mg | ORAL_TABLET | Freq: Two times a day (BID) | ORAL | 0 refills | Status: DC

## 2022-03-11 ENCOUNTER — Telehealth: Payer: Self-pay

## 2022-03-11 LAB — URINE CULTURE: Culture: >=100000 — AB

## 2022-03-11 NOTE — Progress Notes (Signed)
03-11-2022: Left patient a VM to call back with BP readings. Patient called back with readings of 10-25 119/63, 10-26 127/77, 10-27 130/80, 10-28 130/74, 10-29 123/77, 10-30 122/74, 11-01 120/69.   [facility] Pharmacist Assistant [phone]

## 2022-03-12 ENCOUNTER — Ambulatory Visit: Payer: PPO | Admitting: Oncology

## 2022-03-16 ENCOUNTER — Encounter: Payer: Self-pay | Admitting: Oncology

## 2022-04-05 ENCOUNTER — Telehealth: Payer: Self-pay

## 2022-04-05 NOTE — Progress Notes (Signed)
Chronic Care Management Pharmacy Assistant   Name: SARAE NICHOLES  MRN: [id number] DOB: 06-Mar-1961  Reason for Encounter: Disease State/ Hypertension  Recent office visits:  None  Recent consult visits:  03-09-2022 [name], MD (Oncology). Abnormal UA and culture.  Hospital visits:  None in previous 6 months  Medications: Outpatient Encounter Medications as of 04/05/2022  Medication Sig   acetaminophen (TYLENOL) 500 MG tablet Take 500 mg by mouth every 6 (six) hours as needed (pain.).   albuterol (PROVENTIL) (2.5 MG/3ML) 0.083% nebulizer solution INHALE 1 VIAL VIA NEBULIZER 3 TO 4 TIMES A DAY AS DIRECTED   albuterol (VENTOLIN HFA) 108 (90 Base) MCG/ACT inhaler INHALE 1-2 PUFFS BY MOUTH into THE lungs every 4-6 hours AS NEEDED   amLODipine (NORVASC) 5 MG tablet Take 1 tablet (5 mg total) by mouth daily.   apixaban (ELIQUIS) 5 MG TABS tablet Take 1 tablet by mouth twice daily   betamethasone dipropionate (DIPROLENE) 0.05 % ointment Apply 1 application. topically daily as needed (skin irritation.).   ciprofloxacin (CIPRO) 500 MG tablet Take 1 tablet (500 mg total) by mouth 2 (two) times daily.   dexamethasone (DECADRON) 4 MG tablet Take 5 tabs at the night before and 5 tab the morning of chemotherapy, every 3 weeks, by mouth   folic acid (FOLVITE) 1 MG tablet Take 1 tablet (1 mg total) by mouth daily.   halobetasol (ULTRAVATE) 0.05 % cream Apply topically.   levothyroxine (SYNTHROID) 100 MCG tablet TAKE ONE TABLET BY MOUTH EVERY MORNING   loratadine (CLARITIN) 10 MG tablet Take by mouth.   LORazepam (ATIVAN) 1 MG tablet TAKE 1 TABLET(1 MG) BY MOUTH THREE TIMES DAILY AS NEEDED FOR ANXIETY   losartan-hydrochlorothiazide (HYZAAR) 100-25 MG tablet Take 1 tablet by mouth daily.   oxyCODONE (OXY IR/ROXICODONE) 5 MG immediate release tablet Take 1 tablet (5 mg total) by mouth 3 (three) times daily as needed.   Polyethyl Glycol-Propyl Glycol (LUBRICANT EYE DROPS) 0.4-0.3 % SOLN  Place 1-2 drops into both eyes 3 (three) times daily as needed (dry/irritated eyes.).   potassium chloride SA (KLOR-CON M) 20 MEQ tablet Take 2 tablets (40 mEq total) by mouth 2 (two) times daily.   promethazine (PHENERGAN) 25 MG tablet Take 1 tablet (25 mg total) by mouth every 8 (eight) hours as needed for nausea or vomiting.   SYMBICORT 160-4.5 MCG/ACT inhaler Inhale 2 puffs into the lungs 2 (two) times daily. (Patient taking differently: Inhale 1 puff into the lungs 2 (two) times daily.)   Vitamin D, Ergocalciferol, (DRISDOL) 1.25 MG (50000 UNIT) CAPS capsule Take 1 capsule (50,000 Units total) by mouth every 7 (seven) days.   No facility-administered encounter medications on file as of 04/05/2022.   Recent Office Vitals: BP Readings from Last 3 Encounters:  03/09/22 (!) 177/84  02/10/22 122/78  02/09/22 (!) 140/79   Pulse Readings from Last 3 Encounters:  03/09/22 (!) 126  02/10/22 (!) 120  02/09/22 (!) 126    Wt Readings from Last 3 Encounters:  03/09/22 182 lb 11.2 oz (82.9 kg)  02/10/22 177 lb (80.3 kg)  02/09/22 177 lb 12 oz (80.6 kg)     Kidney Function Lab Results  Component Value Date/Time   CREATININE 1.1 03/08/2022 12:00 AM   CREATININE 0.9 01/29/2022 12:00 AM   CREATININE 1.09 (H) 12/28/2021 10:54 AM   CREATININE 0.64 09/04/2021 07:43 AM   CREATININE 0.65 12/13/2016 01:06 PM   CREATININE 0.72 07/05/2016 12:42 PM   GFRNONAA >60 09/04/2021  07:43 AM   GFRAA 105 04/23/2020 10:30 AM       Latest Ref Rng & Units 03/08/2022   12:00 AM 01/29/2022   12:00 AM 01/12/2022   12:00 AM  BMP  BUN 4 - 21 36     18     15      Creatinine 0.5 - 1.1 1.1     0.9     0.6      Sodium 137 - 147 139     136     137      Potassium 3.5 - 5.1 mEq/L 3.8     3.9     4.1      Chloride 99 - 108 107     105     104      CO2 13 - 22 24     25     23       Calcium 8.7 - 10.7 9.1     8.6     8.7         This result is from an external source.     Current antihypertensive regimen:   Amlodipine 5 mg daily Losartan/HCTZ 100/25 mg daily  Patient verbally confirms she is taking the above medications as directed. Yes  Do you receive your medications through PAP? No  If Yes, what is the status? Last received?  How often are you checking your Blood Pressure? daily  she checks her blood pressure in the morning after taking her medication.  Current home BP readings: 11-26 120/73, 11-25 123/76, 11-24 122/74, 11-23 127/77, 11-22 124/77, 11-21 127/74, 11-20 120/72 Wrist or arm cuff: Wrist Caffeine intake:Limited Salt intake:Limited OTC medications including pseudoephedrine or NSAIDs?  Iron and vitamin D   Any readings above 180/120? No  What recent interventions/DTPs have been made by any provider to improve Blood Pressure control since last CPP Visit:  -Educated on BP goals and benefits of medications for prevention of heart attack, stroke and kidney damage; -Counseled to monitor BP at home daily, document, and provide log at future appointments  Any recent hospitalizations or ED visits since last visit with CPP? No  What diet changes have been made to improve Blood Pressure Control?  Patient stated she limits her sodium intake and tries to drink water daily.  What exercise is being done to improve your Blood Pressure Control?  Patient stated she exercises on a elliptical bike daily     Adherence Review: Is the patient currently on ACE/ARB medication? Yes Does the patient have >5 day gap between last estimated fill dates? CPP to review  Care Gaps: Last annual wellness visit? 07-22-2021 Flu vaccine overdue Shingrix overdue  Star Rating Drugs: Losartan/HCTZ 100/25 mg- Last filled 01-11-2022 90 DS. Previous 10-13-2021 90 DS.  [facility] Pharmacist Assistant [phone]

## 2022-04-06 ENCOUNTER — Ambulatory Visit (INDEPENDENT_AMBULATORY_CARE_PROVIDER_SITE_OTHER): Payer: PPO | Admitting: Physician Assistant

## 2022-04-06 ENCOUNTER — Encounter: Payer: Self-pay | Admitting: Physician Assistant

## 2022-04-06 ENCOUNTER — Other Ambulatory Visit: Payer: Self-pay | Admitting: Hematology and Oncology

## 2022-04-06 VITALS — BP 132/88 | HR 107 | Temp 97.1°F | Ht 61.0 in | Wt 173.0 lb

## 2022-04-06 DIAGNOSIS — E039 Hypothyroidism, unspecified: Secondary | ICD-10-CM

## 2022-04-06 DIAGNOSIS — K745 Biliary cirrhosis, unspecified: Secondary | ICD-10-CM

## 2022-04-06 DIAGNOSIS — M25561 Pain in right knee: Secondary | ICD-10-CM

## 2022-04-06 DIAGNOSIS — R739 Hyperglycemia, unspecified: Secondary | ICD-10-CM | POA: Diagnosis not present

## 2022-04-06 DIAGNOSIS — J4540 Moderate persistent asthma, uncomplicated: Secondary | ICD-10-CM

## 2022-04-06 DIAGNOSIS — E559 Vitamin D deficiency, unspecified: Secondary | ICD-10-CM

## 2022-04-06 DIAGNOSIS — N3000 Acute cystitis without hematuria: Secondary | ICD-10-CM | POA: Diagnosis not present

## 2022-04-06 DIAGNOSIS — C3432 Malignant neoplasm of lower lobe, left bronchus or lung: Secondary | ICD-10-CM | POA: Diagnosis not present

## 2022-04-06 DIAGNOSIS — G8929 Other chronic pain: Secondary | ICD-10-CM

## 2022-04-06 DIAGNOSIS — Z8619 Personal history of other infectious and parasitic diseases: Secondary | ICD-10-CM | POA: Diagnosis not present

## 2022-04-06 DIAGNOSIS — Z23 Encounter for immunization: Secondary | ICD-10-CM

## 2022-04-06 DIAGNOSIS — I10 Essential (primary) hypertension: Secondary | ICD-10-CM

## 2022-04-06 DIAGNOSIS — I2694 Multiple subsegmental pulmonary emboli without acute cor pulmonale: Secondary | ICD-10-CM | POA: Diagnosis not present

## 2022-04-06 DIAGNOSIS — M25562 Pain in left knee: Secondary | ICD-10-CM

## 2022-04-06 LAB — POCT URINALYSIS DIP (CLINITEK)
Bilirubin, UA: NEGATIVE
Blood, UA: NEGATIVE
Glucose, UA: NEGATIVE mg/dL
Ketones, POC UA: NEGATIVE mg/dL
Nitrite, UA: NEGATIVE
Spec Grav, UA: 1.015 (ref 1.010–1.025)
Urobilinogen, UA: 0.2 E.U./dL
pH, UA: 6 (ref 5.0–8.0)

## 2022-04-06 MED ORDER — LORAZEPAM 1 MG PO TABS: ORAL_TABLET | ORAL | 0 refills | Status: DC

## 2022-04-06 MED ORDER — OXYCODONE HCL 5 MG PO TABS: 5.0000 mg | ORAL_TABLET | Freq: Three times a day (TID) | ORAL | 0 refills | Status: DC | PRN

## 2022-04-06 NOTE — Progress Notes (Signed)
Subjective:  Patient ID: Natalie Perry, female    DOB: [birth year]  Age: 61 y.o. MRN: [id number]  Chief Complaint  Patient presents with   Hypertension    Hypertension    Pt presents for follow up of hypertension. The patient is tolerating the medication well without side effects. Compliance with treatment has been good; including taking medication as directed , maintains a healthy diet  , and following up as directed. She is currently on norvasc 5mg  qd and hyzaar 100/25 qd -- She denies chest pain/sob/palpitations  Pt with recent history of left lung adenocarcinoma and had LLL lobectomy 4/23.  She is currently following with Dr Bobby Rumpf and has recently completed chemotherapy  She states overall she has been feeling ok except for fatigued  Pt with chronic history of COPD - stable on current medications - she currently uses symbicort and uses albuterol nebulizer prn.  She follows with pulmonologist Dr Alcide Clever on regular basis Pt with history of PE in past as well and currently on Eliquis  Pt with history of hypothyroidism - currently taking synthroid 132mcg qd - due for labwork   Pt with history of Vit D insufficiency - is due for labwork - is currently taking weekly supplements  Pt with known history of thrombocytopenia and follows Dr Bobby Rumpf for this issue as well - This is secondary to Hepatitis C cirrhosis and she have secondary splenomegaly  Pt with history of anxiety - stable on ativan 1mg  - requests refill of medication  Pt with history of chronic knee pain - not a candidate for surgery at this time Manages her pain with oxycodone as needed Requests refill of medication  Pt requests to get flu shot today Current Outpatient Medications on File Prior to Visit  Medication Sig Dispense Refill   acetaminophen (TYLENOL) 500 MG tablet Take 500 mg by mouth every 6 (six) hours as needed (pain.).     albuterol (PROVENTIL) (2.5 MG/3ML) 0.083% nebulizer solution INHALE 1 VIAL VIA  NEBULIZER 3 TO 4 TIMES A DAY AS DIRECTED 300 mL 3   albuterol (VENTOLIN HFA) 108 (90 Base) MCG/ACT inhaler INHALE 1-2 PUFFS BY MOUTH into THE lungs every 4-6 hours AS NEEDED 8.5 g 5   amLODipine (NORVASC) 5 MG tablet Take 1 tablet (5 mg total) by mouth daily. 90 tablet 1   apixaban (ELIQUIS) 5 MG TABS tablet Take 1 tablet by mouth twice daily 60 tablet 2   betamethasone dipropionate (DIPROLENE) 0.05 % ointment Apply 1 application. topically daily as needed (skin irritation.).     dexamethasone (DECADRON) 4 MG tablet Take 5 tabs at the night before and 5 tab the morning of chemotherapy, every 3 weeks, by mouth 60 tablet 0   folic acid (FOLVITE) 1 MG tablet Take 1 tablet (1 mg total) by mouth daily. 60 tablet 3   halobetasol (ULTRAVATE) 0.05 % cream Apply topically.     levothyroxine (SYNTHROID) 100 MCG tablet TAKE ONE TABLET BY MOUTH EVERY MORNING 90 tablet 0   loratadine (CLARITIN) 10 MG tablet Take by mouth.     LORazepam (ATIVAN) 1 MG tablet TAKE 1 TABLET(1 MG) BY MOUTH THREE TIMES DAILY AS NEEDED FOR ANXIETY 90 tablet 0   losartan-hydrochlorothiazide (HYZAAR) 100-25 MG tablet Take 1 tablet by mouth daily. 90 tablet 0   oxyCODONE (OXY IR/ROXICODONE) 5 MG immediate release tablet Take 1 tablet (5 mg total) by mouth 3 (three) times daily as needed. 60 tablet 0   Polyethyl Glycol-Propyl Glycol (LUBRICANT EYE DROPS) 0.4-0.3 %  SOLN Place 1-2 drops into both eyes 3 (three) times daily as needed (dry/irritated eyes.).     potassium chloride SA (KLOR-CON M) 20 MEQ tablet Take 2 tablets (40 mEq total) by mouth 2 (two) times daily. 360 tablet 0   promethazine (PHENERGAN) 25 MG tablet Take 1 tablet (25 mg total) by mouth every 8 (eight) hours as needed for nausea or vomiting. 30 tablet 2   SYMBICORT 160-4.5 MCG/ACT inhaler Inhale 2 puffs into the lungs 2 (two) times daily. (Patient taking differently: Inhale 1 puff into the lungs 2 (two) times daily.) 1 each 12   Vitamin D, Ergocalciferol, (DRISDOL) 1.25 MG  (50000 UNIT) CAPS capsule Take 1 capsule (50,000 Units total) by mouth every 7 (seven) days. 12 capsule 1   No current facility-administered medications on file prior to visit.   Past Medical History:  Diagnosis Date   Anxiety    Arthritis    Asthma    Bipolar affective disorder ([place])    Cirrhosis of liver due to hepatitis C    Hepatitis    Hypertension    Hypothyroidism    Pulmonary embolus ([place]) 10/2020   Thyroid disease    Past Surgical History:  Procedure Laterality Date   ABDOMINAL HYSTERECTOMY     ABDOMINAL SURGERY     APPENDECTOMY     CHOLECYSTECTOMY     INTERCOSTAL NERVE BLOCK Left 08/31/2021   Procedure: INTERCOSTAL NERVE BLOCK;  Surgeon: [name], MD;  Location: [facility] OR;  Service: Thoracic;  Laterality: Left;   NODE DISSECTION Left 08/31/2021   Procedure: NODE DISSECTION;  Surgeon: [name], MD;  Location: [facility];  Service: Thoracic;  Laterality: Left;   XI ROBOTIC ASSISTED THORACOSCOPY- SEGMENTECTOMY Left 08/31/2021   Procedure: XI ROBOTIC ASSISTED THORACOSCOPY- LEFT LOWER LOBE LOBECTOMY;  Surgeon: [name], MD;  Location: [facility] OR;  Service: Thoracic;  Laterality: Left;    Family History  Problem Relation Age of Onset   Asthma Mother    Bipolar disorder Mother    CAD Father    Hypertension Father    Social History   Socioeconomic History   Marital status: Divorced    Spouse name: Not on file   Number of children: 2   Years of education: Not on file   Highest education level: Not on file  Occupational History   Occupation: disabled  Tobacco Use   Smoking status: Never   Smokeless tobacco: Never  Vaping Use   Vaping Use: Never used  Substance and Sexual Activity   Alcohol use: No   Drug use: Never   Sexual activity: Not on file  Other Topics Concern   Not on file  Social History Narrative   Not on file   Social Determinants of Health   Financial Resource Strain: Low Risk  (03/03/2022)   Overall Financial Resource  Strain (CARDIA)    Difficulty of Paying Living Expenses: Not hard at all  Food Insecurity: No Food Insecurity (05/22/2020)   Hunger Vital Sign    Worried About Running Out of Food in the Last Year: Never true    [place] in the Last Year: Never true  Transportation Needs: No Transportation Needs (03/03/2022)   PRAPARE - Hydrologist (Medical): No    Lack of Transportation (Non-Medical): No  Physical Activity: Insufficiently Active (07/23/2021)   Exercise Vital Sign    Days of Exercise per Week: 2 days    Minutes of Exercise per Session:  30 min  Stress: Not on file  Social Connections: Not on file    CONSTITUTIONAL: see HPI E/N/T: Negative for ear pain, nasal congestion and sore throat.  CARDIOVASCULAR: Negative for chest pain, dizziness, palpitations and pedal edema.  RESPIRATORY: Negative for recent cough and dyspnea.  GASTROINTESTINAL: Negative for abdominal pain, acid reflux symptoms, constipation, diarrhea, nausea and vomiting.  MSK: see HPI INTEGUMENTARY: Negative for rash.  NEUROLOGICAL: Negative for dizziness and headaches.  PSYCHIATRIC: Negative for sleep disturbance and to question depression screen.  Negative for depression, negative for anhedonia.       Objective:  PHYSICAL EXAM:   VS: BP 132/88 (BP Location: Left Arm, Patient Position: Sitting, Cuff Size: Normal)   Pulse (!) 107   Temp (!) 97.1 F (36.2 C) (Temporal)   Ht 5\' 1"  (1.549 m)   Wt 173 lb (78.5 kg)   SpO2 96%   BMI 32.69 kg/m   GEN: Well nourished, well developed, in no acute distress  Cardiac: RRR; no murmurs, rubs, or gallops,no edema -  Respiratory:  normal respiratory rate and pattern with no distress - normal breath sounds with no rales, rhonchi, wheezes or rubs MS: no deformity or atrophy- walks with walker for assistance Skin: warm and dry, no rash  Neuro:  Alert and Oriented x 3,  - CN II-Xii grossly intact Psych: euthymic mood, appropriate affect and  demeanor  Lab Results  Component Value Date   WBC 10.0 03/08/2022   HGB 8.4 (A) 03/08/2022   HCT 24 (A) 03/08/2022   PLT 84 (A) 03/08/2022   GLUCOSE 140 (H) 12/28/2021   CHOL 101 12/28/2021   TRIG 72 12/28/2021   HDL 29 (L) 12/28/2021   LDLCALC 57 12/28/2021   ALT 22 03/08/2022   AST 45 (A) 03/08/2022   NA 139 03/08/2022   K 3.8 03/08/2022   CL 107 03/08/2022   CREATININE 1.1 03/08/2022   BUN 36 (A) 03/08/2022   CO2 24 (A) 03/08/2022   TSH 5.470 (H) 12/28/2021   INR 1.3 (H) 08/28/2021   HGBA1C 5.3 12/28/2021      Assessment & Plan:   Problem List Items Addressed This Visit       Cardiovascular and Mediastinum   Benign hypertension   Relevant Medications    Continue current meds   Other Relevant Orders   CBC with Differential/Platelet   Comprehensive metabolic panel   TSH   Lipid panel     Respiratory   Lung cancer, lower lobe (HCC) (Chronic)   Relevant Orders    Continue follow up with Dr Alcide Clever and Dr Bobby Rumpf   Moderate persistent asthma without complication Continue current meds     Digestive   Hepatic cirrhosis (HCC)   Relevant Orders   Comprehensive metabolic panel     Other   Vitamin D deficiency   Relevant Orders   VITAMIN D 25 Hydroxy (Vit-D Deficiency, Fractures) Continue meds   Chronic pain of both knees   Relevant Medications   oxyCODONE (OXY IR/ROXICODONE) 5 MG immediate release tablet  Leukocytes in urine Urine culture pending      .  No orders of the defined types were placed in this encounter.   Orders Placed This Encounter  Procedures   Urine Culture   Flu Vaccine MDCK QUAD PF   CBC with Differential/Platelet   Comprehensive metabolic panel   TSH   Lipid panel   VITAMIN D 25 Hydroxy (Vit-D Deficiency, Fractures)   Hemoglobin A1c   POCT URINALYSIS DIP (CLINITEK)  Follow-up: Return in about 3 months (around 07/07/2022) for chronic fasting follow up.  An After Visit Summary was printed and given to the  patient.  Yetta Flock [facility] [phone]

## 2022-04-07 ENCOUNTER — Other Ambulatory Visit: Payer: Self-pay

## 2022-04-07 ENCOUNTER — Telehealth: Payer: Self-pay

## 2022-04-07 DIAGNOSIS — K7460 Unspecified cirrhosis of liver: Secondary | ICD-10-CM

## 2022-04-07 DIAGNOSIS — D696 Thrombocytopenia, unspecified: Secondary | ICD-10-CM

## 2022-04-07 LAB — CBC WITH DIFFERENTIAL/PLATELET
Basophils Absolute: 0 10*3/uL (ref 0.0–0.2)
Basos: 0 %
EOS (ABSOLUTE): 0.1 10*3/uL (ref 0.0–0.4)
Eos: 1 %
Hematocrit: 31.5 % — ABNORMAL LOW (ref 34.0–46.6)
Hemoglobin: 10.4 g/dL — ABNORMAL LOW (ref 11.1–15.9)
Immature Grans (Abs): 0 10*3/uL (ref 0.0–0.1)
Immature Granulocytes: 1 %
Lymphocytes Absolute: 0.8 10*3/uL (ref 0.7–3.1)
Lymphs: 9 %
MCH: 32.2 pg (ref 26.6–33.0)
MCHC: 33 g/dL (ref 31.5–35.7)
MCV: 98 fL — ABNORMAL HIGH (ref 79–97)
Monocytes Absolute: 0.7 10*3/uL (ref 0.1–0.9)
Monocytes: 7 %
Neutrophils Absolute: 7.2 10*3/uL — ABNORMAL HIGH (ref 1.4–7.0)
Neutrophils: 82 %
Platelets: 110 10*3/uL — ABNORMAL LOW (ref 150–450)
RBC: 3.23 x10E6/uL — ABNORMAL LOW (ref 3.77–5.28)
RDW: 14.6 % (ref 11.7–15.4)
WBC: 8.8 10*3/uL (ref 3.4–10.8)

## 2022-04-07 LAB — COMPREHENSIVE METABOLIC PANEL
ALT: 19 IU/L (ref 0–32)
AST: 40 IU/L (ref 0–40)
Albumin/Globulin Ratio: 1.2 (ref 1.2–2.2)
Albumin: 3.9 g/dL (ref 3.9–4.9)
Alkaline Phosphatase: 72 IU/L (ref 44–121)
BUN/Creatinine Ratio: 21 (ref 12–28)
BUN: 27 mg/dL (ref 8–27)
Bilirubin Total: 1.3 mg/dL — ABNORMAL HIGH (ref 0.0–1.2)
CO2: 21 mmol/L (ref 20–29)
Calcium: 9.2 mg/dL (ref 8.7–10.3)
Chloride: 102 mmol/L (ref 96–106)
Creatinine, Ser: 1.28 mg/dL — ABNORMAL HIGH (ref 0.57–1.00)
Globulin, Total: 3.3 g/dL (ref 1.5–4.5)
Glucose: 114 mg/dL — ABNORMAL HIGH (ref 70–99)
Potassium: 4.2 mmol/L (ref 3.5–5.2)
Sodium: 138 mmol/L (ref 134–144)
Total Protein: 7.2 g/dL (ref 6.0–8.5)
eGFR: 47 mL/min/{1.73_m2} — ABNORMAL LOW (ref >59)

## 2022-04-07 LAB — LIPID PANEL
Chol/HDL Ratio: 3.1 ratio (ref 0.0–4.4)
Cholesterol, Total: 120 mg/dL (ref 100–199)
HDL: 39 mg/dL — ABNORMAL LOW (ref >39)
LDL Chol Calc (NIH): 64 mg/dL (ref 0–99)
Triglycerides: 85 mg/dL (ref 0–149)
VLDL Cholesterol Cal: 17 mg/dL (ref 5–40)

## 2022-04-07 LAB — URINE CULTURE

## 2022-04-07 LAB — HEMOGLOBIN A1C
Est. average glucose Bld gHb Est-mCnc: 77 mg/dL
Hgb A1c MFr Bld: 4.3 % — ABNORMAL LOW (ref 4.8–5.6)

## 2022-04-07 LAB — VITAMIN D 25 HYDROXY (VIT D DEFICIENCY, FRACTURES): Vit D, 25-Hydroxy: 59.6 ng/mL (ref 30.0–100.0)

## 2022-04-07 LAB — CARDIOVASCULAR RISK ASSESSMENT

## 2022-04-07 LAB — TSH: TSH: 4.39 u[IU]/mL (ref 0.450–4.500)

## 2022-04-07 MED ORDER — POTASSIUM CHLORIDE CRYS ER 20 MEQ PO TBCR: 40.0000 meq | EXTENDED_RELEASE_TABLET | Freq: Two times a day (BID) | ORAL | 0 refills | Status: DC

## 2022-04-07 MED ORDER — LOSARTAN POTASSIUM-HCTZ 100-25 MG PO TABS: 1.0000 | ORAL_TABLET | Freq: Every day | ORAL | 0 refills | Status: DC

## 2022-04-07 MED ORDER — LORAZEPAM 1 MG PO TABS: ORAL_TABLET | ORAL | 0 refills | Status: DC

## 2022-04-07 MED ORDER — LEVOTHYROXINE SODIUM 100 MCG PO TABS: ORAL_TABLET | ORAL | 0 refills | Status: DC

## 2022-04-07 MED ORDER — APIXABAN 5 MG PO TABS: ORAL_TABLET | ORAL | 2 refills | Status: DC

## 2022-04-07 NOTE — Progress Notes (Signed)
Chronic Care Management Pharmacy Assistant   Name: Natalie Perry  MRN: [id number] DOB: 1961/05/07   Reason for Encounter: Medication Coordination for [facility]   Recent office visits:  04/06/22 [name] PA-C. Seen for HTN. No med changes.  Recent consult visits:  None  Hospital visits:  None  Medications: Outpatient Encounter Medications as of 04/07/2022  Medication Sig   acetaminophen (TYLENOL) 500 MG tablet Take 500 mg by mouth every 6 (six) hours as needed (pain.).   albuterol (PROVENTIL) (2.5 MG/3ML) 0.083% nebulizer solution INHALE 1 VIAL VIA NEBULIZER 3 TO 4 TIMES A DAY AS DIRECTED   albuterol (VENTOLIN HFA) 108 (90 Base) MCG/ACT inhaler INHALE 1-2 PUFFS BY MOUTH into THE lungs every 4-6 hours AS NEEDED   amLODipine (NORVASC) 5 MG tablet Take 1 tablet (5 mg total) by mouth daily.   apixaban (ELIQUIS) 5 MG TABS tablet Take 1 tablet by mouth twice daily   betamethasone dipropionate (DIPROLENE) 0.05 % ointment Apply 1 application. topically daily as needed (skin irritation.).   folic acid (FOLVITE) 1 MG tablet Take 1 tablet (1 mg total) by mouth daily.   levothyroxine (SYNTHROID) 100 MCG tablet TAKE ONE TABLET BY MOUTH EVERY MORNING   loratadine (CLARITIN) 10 MG tablet Take by mouth.   LORazepam (ATIVAN) 1 MG tablet TAKE 1 TABLET(1 MG) BY MOUTH THREE TIMES DAILY AS NEEDED FOR ANXIETY   losartan-hydrochlorothiazide (HYZAAR) 100-25 MG tablet Take 1 tablet by mouth daily.   oxyCODONE (OXY IR/ROXICODONE) 5 MG immediate release tablet Take 1 tablet (5 mg total) by mouth 3 (three) times daily as needed.   Polyethyl Glycol-Propyl Glycol (LUBRICANT EYE DROPS) 0.4-0.3 % SOLN Place 1-2 drops into both eyes 3 (three) times daily as needed (dry/irritated eyes.).   potassium chloride SA (KLOR-CON M) 20 MEQ tablet Take 2 tablets (40 mEq total) by mouth 2 (two) times daily.   promethazine (PHENERGAN) 25 MG tablet Take 1 tablet (25 mg total) by mouth every 8 (eight) hours as needed for  nausea or vomiting.   SYMBICORT 160-4.5 MCG/ACT inhaler Inhale 2 puffs into the lungs 2 (two) times daily. (Patient taking differently: Inhale 1 puff into the lungs 2 (two) times daily.)   Vitamin D, Ergocalciferol, (DRISDOL) 1.25 MG (50000 UNIT) CAPS capsule Take 1 capsule (50,000 Units total) by mouth every 7 (seven) days.   No facility-administered encounter medications on file as of 04/07/2022.    Reviewed chart for medication changes ahead of medication coordination call.  No  Consults, or hospital visits since last care coordination call/Pharmacist visit.   No medication changes indicated OR if recent visit, treatment plan here.  BP Readings from Last 3 Encounters:  04/06/22 132/88  03/09/22 (!) 177/84  02/10/22 122/78    Lab Results  Component Value Date   HGBA1C 5.3 12/28/2021     Patient obtains medications through Vials  90 Days   Last adherence delivery included:  Eliquis 5mg  -1 tab twice daily (30DS)  Potassium Chloride 46meq 2 tabs twice daily Levothyroxine 179mcg 1 tab every morning Losartan-HCTZ 100-25mg  1 tab once daily Budesonide-Formot Inhaler 160-4.5 2 puffs twice daily Albuterol Inhaler 39mcg 1-2 puffs every 4-6 hours prn  Folic Acid 1mg -1 tab once daily          Promethazine 25mg -1 tab every 8 hours as needed  Amlodipine 5mg -1 tab once daily         Patient declined (meds) last delivery Decadron 4mg - Filled 12/02/21 84ds at [facility]  Lorazepam 1mg - Filled at [facility]  on 12/11/21 30ds Oxycodone 5mg - Filled on 12/28/21 20ds at [facility]  Vitamin 1.25mg - Filled at [facility] on 12/31/21 84ds  Patient is due for next adherence delivery on: 04/19/22. Called patient and reviewed medications and coordinated delivery.  This delivery to include: Eliquis 5mg  -1 tab twice daily (30DS)  Potassium Chloride 61meq 2 tabs twice daily Levothyroxine 142mcg 1 tab every morning Losartan-HCTZ 100-25mg  1 tab once daily Budesonide-Formot Inhaler 160-4.5 2 puffs twice  daily Albuterol Inhaler 42mcg 1-2 puffs every 4-6 hours prn  Folic Acid 1mg -1 tab once daily     Lorazepam 1mg -1 tab three times daily as needed(Delivered on 04/06/22 30ds) Amlodipine 5mg -1 tab once daily      Vitamin D2 1.25mg -1 capsule once a week   Patient declined the following medications  Oxycodone 5mg - This was delivered on 04/06/22 Promethazine 25mg -Pt does not need currently  Patient needs refills-Request Sent  Losartan-HCTZ 100-25mg  Potassium Chloride 4meq  Levothyroxine 123mcg  Eliquis 5mg   Folic Acid 1mg  ([facility] sent refill request) Lorazepam 1mg   Confirmed delivery date of 04/19/22, advised patient that pharmacy will contact them the morning of delivery.   Elray Mcgregor, [place] Pharmacist Assistant  [phone]

## 2022-04-15 ENCOUNTER — Other Ambulatory Visit: Payer: Self-pay | Admitting: Hematology and Oncology

## 2022-05-18 ENCOUNTER — Telehealth: Payer: Self-pay

## 2022-05-18 NOTE — Progress Notes (Signed)
Care Management & Coordination Services Pharmacy Team  Reason for Encounter: Hypertension    Recent office visits:  None  Recent consult visits:  None  Hospital visits:  None in previous 6 months  Medications: Outpatient Encounter Medications as of 05/18/2022  Medication Sig   acetaminophen (TYLENOL) 500 MG tablet Take 500 mg by mouth every 6 (six) hours as needed (pain.).   albuterol (PROVENTIL) (2.5 MG/3ML) 0.083% nebulizer solution INHALE 1 VIAL VIA NEBULIZER 3 TO 4 TIMES A DAY AS DIRECTED   albuterol (VENTOLIN HFA) 108 (90 Base) MCG/ACT inhaler INHALE 1-2 PUFFS BY MOUTH into THE lungs every 4-6 hours AS NEEDED   amLODipine (NORVASC) 5 MG tablet Take 1 tablet (5 mg total) by mouth daily.   apixaban (ELIQUIS) 5 MG TABS tablet Take 1 tablet by mouth twice daily   betamethasone dipropionate (DIPROLENE) 0.05 % ointment Apply 1 application. topically daily as needed (skin irritation.).   folic acid (FOLVITE) 1 MG tablet TAKE ONE TABLET BY MOUTH ONCE DAILY   levothyroxine (SYNTHROID) 100 MCG tablet TAKE ONE TABLET BY MOUTH EVERY MORNING   loratadine (CLARITIN) 10 MG tablet Take by mouth.   LORazepam (ATIVAN) 1 MG tablet TAKE 1 TABLET(1 MG) BY MOUTH THREE TIMES DAILY AS NEEDED FOR ANXIETY   losartan-hydrochlorothiazide (HYZAAR) 100-25 MG tablet Take 1 tablet by mouth daily.   oxyCODONE (OXY IR/ROXICODONE) 5 MG immediate release tablet Take 1 tablet (5 mg total) by mouth 3 (three) times daily as needed.   Polyethyl Glycol-Propyl Glycol (LUBRICANT EYE DROPS) 0.4-0.3 % SOLN Place 1-2 drops into both eyes 3 (three) times daily as needed (dry/irritated eyes.).   potassium chloride SA (KLOR-CON M) 20 MEQ tablet Take 2 tablets (40 mEq total) by mouth 2 (two) times daily.   promethazine (PHENERGAN) 25 MG tablet Take 1 tablet (25 mg total) by mouth every 8 (eight) hours as needed for nausea or vomiting.   SYMBICORT 160-4.5 MCG/ACT inhaler Inhale 2 puffs into the lungs 2 (two) times daily. (Patient  taking differently: Inhale 1 puff into the lungs 2 (two) times daily.)   Vitamin D, Ergocalciferol, (DRISDOL) 1.25 MG (50000 UNIT) CAPS capsule Take 1 capsule (50,000 Units total) by mouth every 7 (seven) days.   No facility-administered encounter medications on file as of 05/18/2022.    Recent Office Vitals: BP Readings from Last 3 Encounters:  04/06/22 132/88  03/09/22 (!) 177/84  02/10/22 122/78   Pulse Readings from Last 3 Encounters:  04/06/22 (!) 107  03/09/22 (!) 126  02/10/22 (!) 120    Wt Readings from Last 3 Encounters:  04/06/22 173 lb (78.5 kg)  03/09/22 182 lb 11.2 oz (82.9 kg)  02/10/22 177 lb (80.3 kg)     Kidney Function Lab Results  Component Value Date/Time   CREATININE 1.28 (H) 04/06/2022 10:44 AM   CREATININE 1.1 03/08/2022 12:00 AM   CREATININE 0.9 01/29/2022 12:00 AM   CREATININE 1.09 (H) 12/28/2021 10:54 AM   CREATININE 0.65 12/13/2016 01:06 PM   CREATININE 0.72 07/05/2016 12:42 PM   GFRNONAA >60 09/04/2021 07:43 AM   GFRAA 105 04/23/2020 10:30 AM       Latest Ref Rng & Units 04/06/2022   10:44 AM 03/08/2022   12:00 AM 01/29/2022   12:00 AM  BMP  Glucose 70 - 99 mg/dL 230     BUN 8 - 27 mg/dL 27  36     18      Creatinine 0.57 - 1.00 mg/dL 9.77  1.1  0.9      BUN/Creat Ratio 12 - 28 21     Sodium 134 - 144 mmol/L 138  139     136      Potassium 3.5 - 5.2 mmol/L 4.2  3.8     3.9      Chloride 96 - 106 mmol/L 102  107     105      CO2 20 - 29 mmol/L 21  24     25       Calcium 8.7 - 10.3 mg/dL 9.2  9.1     8.6         This result is from an external source.     05-18-2022: 1st attempt left VM 05-20-2022: 2nd attempt left VM 05-21-2022: 3rd attempt left VM  Adherence Review: Is the patient currently on ACE/ARB medication? Yes Does the patient have >5 day gap between last estimated fill dates? No  Star Rating Drugs:  Losartan/HCTZ 100/25 mg- Last filled 04-19-2022-2023 90 DS. Previous 10-13-2021 90 DS.   [place] 12-13-2021

## 2022-05-24 ENCOUNTER — Other Ambulatory Visit: Payer: Self-pay | Admitting: Thoracic Surgery (Cardiothoracic Vascular Surgery)

## 2022-05-24 DIAGNOSIS — Z902 Acquired absence of lung [part of]: Secondary | ICD-10-CM

## 2022-05-25 ENCOUNTER — Ambulatory Visit
Admission: RE | Admit: 2022-05-25 | Discharge: 2022-05-25 | Disposition: A | Discharge status: Home / Self Care | Payer: PPO | Source: Ambulatory Visit | Attending: Thoracic Surgery (Cardiothoracic Vascular Surgery) | Admitting: Thoracic Surgery (Cardiothoracic Vascular Surgery)

## 2022-05-25 ENCOUNTER — Ambulatory Visit: Payer: PPO | Admitting: Thoracic Surgery (Cardiothoracic Vascular Surgery)

## 2022-05-25 ENCOUNTER — Encounter: Payer: Self-pay | Admitting: Thoracic Surgery (Cardiothoracic Vascular Surgery)

## 2022-05-25 VITALS — BP 148/86 | HR 98 | Resp 18 | Ht 61.0 in | Wt 170.0 lb

## 2022-05-25 DIAGNOSIS — Z902 Acquired absence of lung [part of]: Secondary | ICD-10-CM

## 2022-05-25 DIAGNOSIS — C3492 Malignant neoplasm of unspecified part of left bronchus or lung: Secondary | ICD-10-CM | POA: Diagnosis not present

## 2022-05-25 DIAGNOSIS — R059 Cough, unspecified: Secondary | ICD-10-CM | POA: Diagnosis not present

## 2022-05-25 NOTE — Progress Notes (Signed)
[address]       [place] [address]             [phone]     HPI: Mrs. Ruggerio returns for a scheduled follow-up visit  Shakeeta Godette is a 62 year old woman who underwent a robotic left lower lobectomy on 08/31/2021.  Pathology showed a T2a, M1, stage IIb adenocarcinoma.  She had some issues with intercostal neuralgia postoperatively but that ultimately resolved.  She was treated with adjuvant chemotherapy with cisplatin and pemetrexed by Dr. Melvyn Neth in [place].  She had a very rough time with her last cycle of chemotherapy back in October.  Since then she has progressively gotten better.  She had a virus about a month ago and has had some persistent cough related to that.  Paradoxically she says her breathing is better than it was prior to surgery.  Past Medical History:  Diagnosis Date   Anxiety    Arthritis    Asthma    Bipolar affective disorder (HCC)    Cirrhosis of liver due to hepatitis C    Hepatitis    Hypertension    Hypothyroidism    Pulmonary embolus (HCC) 10/2020   Thyroid disease     Current Outpatient Medications  Medication Sig Dispense Refill   acetaminophen (TYLENOL) 500 MG tablet Take 500 mg by mouth every 6 (six) hours as needed (pain.).     albuterol (PROVENTIL) (2.5 MG/3ML) 0.083% nebulizer solution INHALE 1 VIAL VIA NEBULIZER 3 TO 4 TIMES A DAY AS DIRECTED 300 mL 3   albuterol (VENTOLIN HFA) 108 (90 Base) MCG/ACT inhaler INHALE 1-2 PUFFS BY MOUTH into THE lungs every 4-6 hours AS NEEDED 8.5 g 5   amLODipine (NORVASC) 5 MG tablet Take 1 tablet (5 mg total) by mouth daily. 90 tablet 1   apixaban (ELIQUIS) 5 MG TABS tablet Take 1 tablet by mouth twice daily 60 tablet 2   betamethasone dipropionate (DIPROLENE) 0.05 % ointment Apply 1 application. topically daily as needed (skin irritation.).     folic acid (FOLVITE) 1 MG tablet TAKE ONE TABLET BY MOUTH ONCE DAILY 60 tablet 3   levothyroxine (SYNTHROID) 100 MCG tablet TAKE ONE TABLET  BY MOUTH EVERY MORNING 90 tablet 0   loratadine (CLARITIN) 10 MG tablet Take by mouth.     LORazepam (ATIVAN) 1 MG tablet TAKE 1 TABLET(1 MG) BY MOUTH THREE TIMES DAILY AS NEEDED FOR ANXIETY 90 tablet 0   losartan-hydrochlorothiazide (HYZAAR) 100-25 MG tablet Take 1 tablet by mouth daily. 90 tablet 0   oxyCODONE (OXY IR/ROXICODONE) 5 MG immediate release tablet Take 1 tablet (5 mg total) by mouth 3 (three) times daily as needed. 60 tablet 0   Polyethyl Glycol-Propyl Glycol (LUBRICANT EYE DROPS) 0.4-0.3 % SOLN Place 1-2 drops into both eyes 3 (three) times daily as needed (dry/irritated eyes.).     potassium chloride SA (KLOR-CON M) 20 MEQ tablet Take 2 tablets (40 mEq total) by mouth 2 (two) times daily. 360 tablet 0   promethazine (PHENERGAN) 25 MG tablet Take 1 tablet (25 mg total) by mouth every 8 (eight) hours as needed for nausea or vomiting. 30 tablet 2   SYMBICORT 160-4.5 MCG/ACT inhaler Inhale 2 puffs into the lungs 2 (two) times daily. (Patient taking differently: Inhale 1 puff into the lungs 2 (two) times daily.) 1 each 12   Vitamin D, Ergocalciferol, (DRISDOL) 1.25 MG (50000 UNIT) CAPS capsule Take 1 capsule (50,000 Units total) by mouth every 7 (seven)  days. 12 capsule 1   No current facility-administered medications for this visit.    Physical Exam BP (!) 148/86 (BP Location: Right Arm, Patient Position: Sitting)   Pulse 98   Resp 18   Ht 5\' 1"  (1.549 m)   Wt 170 lb (77.1 kg)   SpO2 91% Comment: RA  BMI 32.80 kg/m  62 year old woman in no acute distress Alert and oriented x 3 with no focal deficits Lungs diminished at left base but otherwise clear Cardiac regular rate and rhythm Incisions well-healed No cervical or supraclavicular adenopathy  Diagnostic Tests: Chest x-ray shows postoperative changes.  No evidence of recurrent disease.  Impression: Heba Ige is a 62 year old woman with a history of pulmonary embolus, arthritis, asthma, hepatitis C, cirrhosis,  bipolar disorder, remote tobacco use, and a stage IIb adenocarcinoma of the lung.  She underwent a robotic assisted left lower lobectomy in April 2023.  She is now about 9 months out from surgery.  Very occasionally will have a pain in that region but overall feels very well.  She is being followed by Dr. May 2023.  She is post to see him back in March or April with a CT.  Plan: Follow-up with Dr. May I will be happy to see Mrs. Finerty back anytime in the future if I can be of any assistance with her care  Melvyn Neth, MD [facility] [phone]

## 2022-06-07 IMAGING — DX DG CHEST 2V
2 series · 2 of 2 positions shown · non-contrast
Comparison: 09/04/2021

CLINICAL DATA: Post robotic LEFT lower lobectomy

EXAM:
CHEST - 2 VIEW

[dg chest 2 view (1 of 2)]
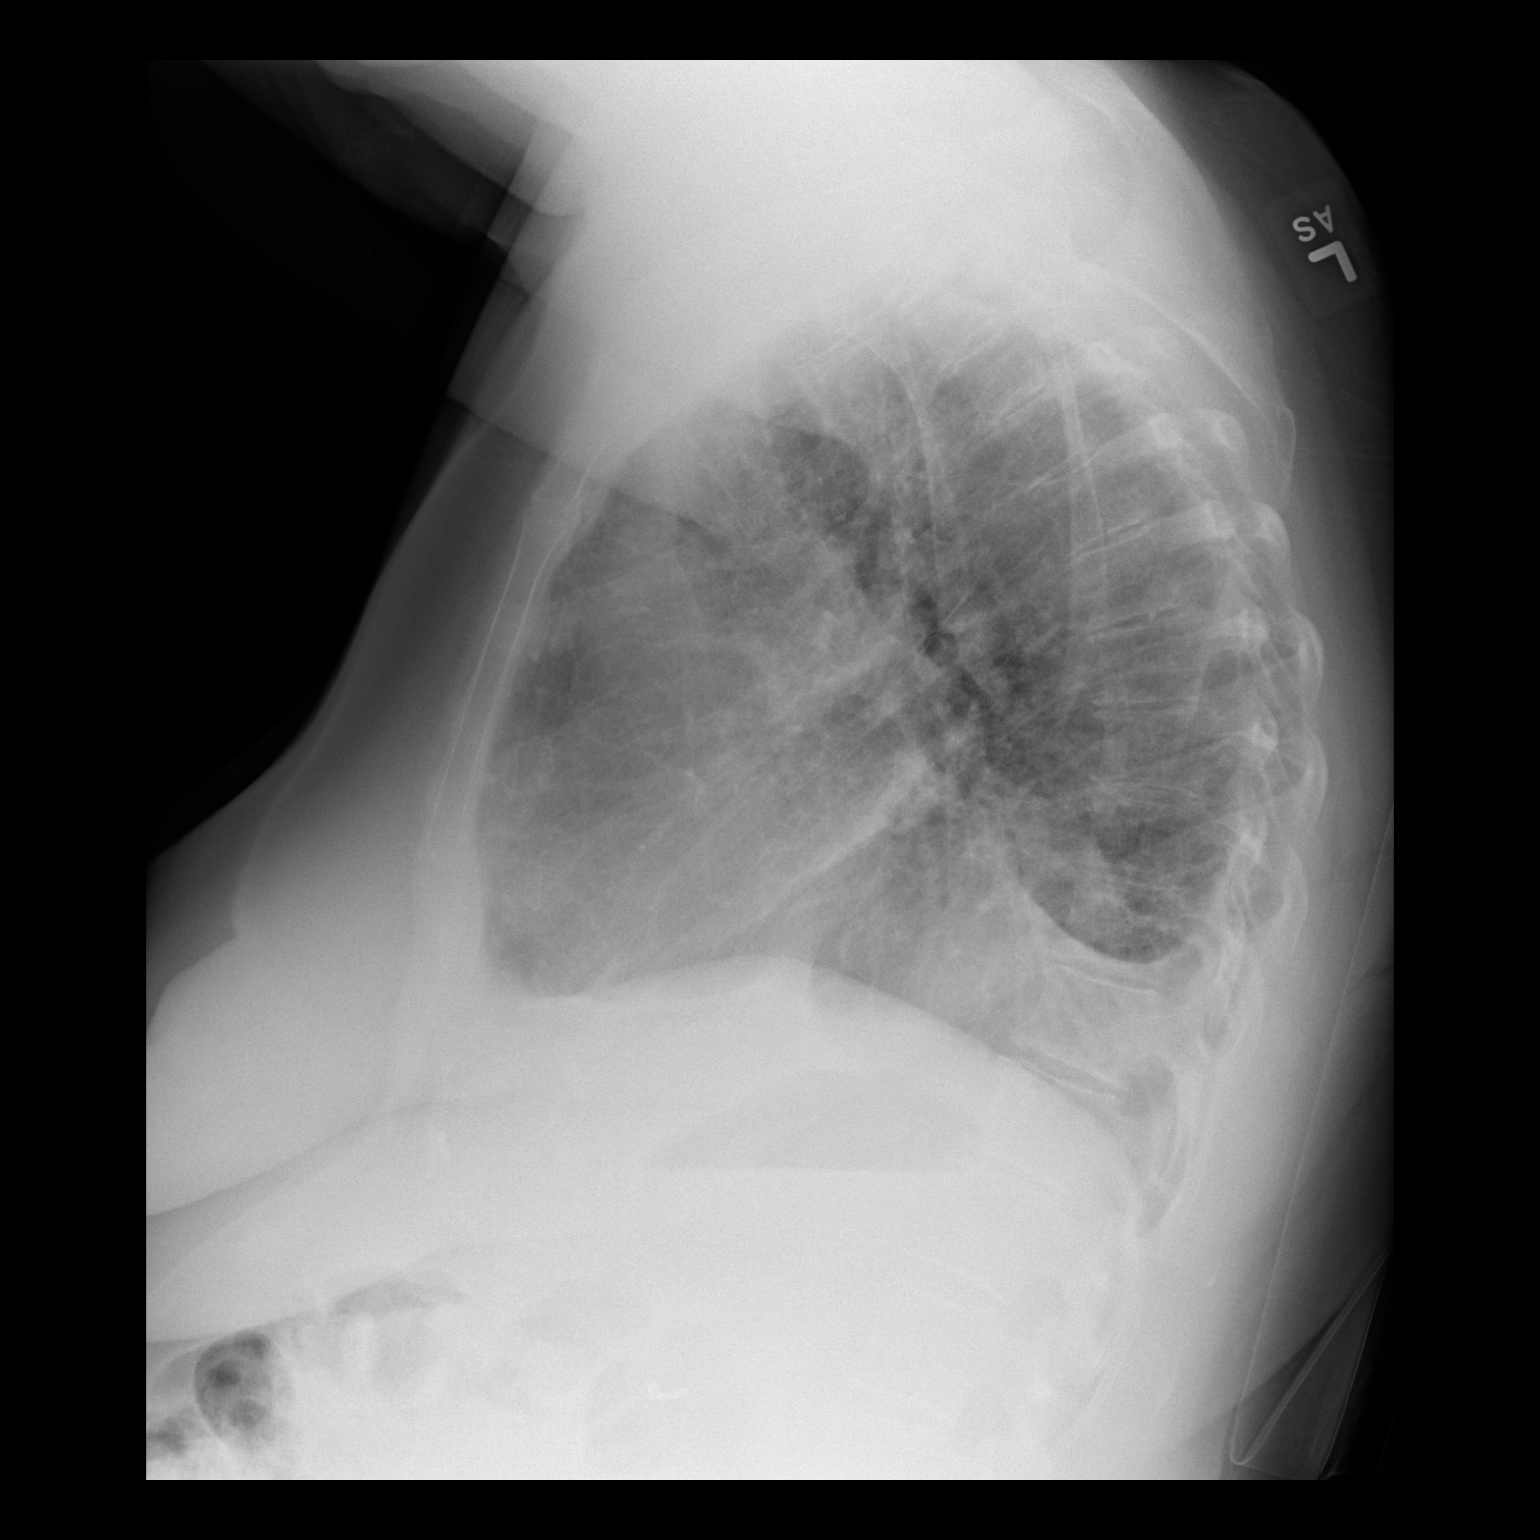

[dg chest 2 view (2 of 2)]
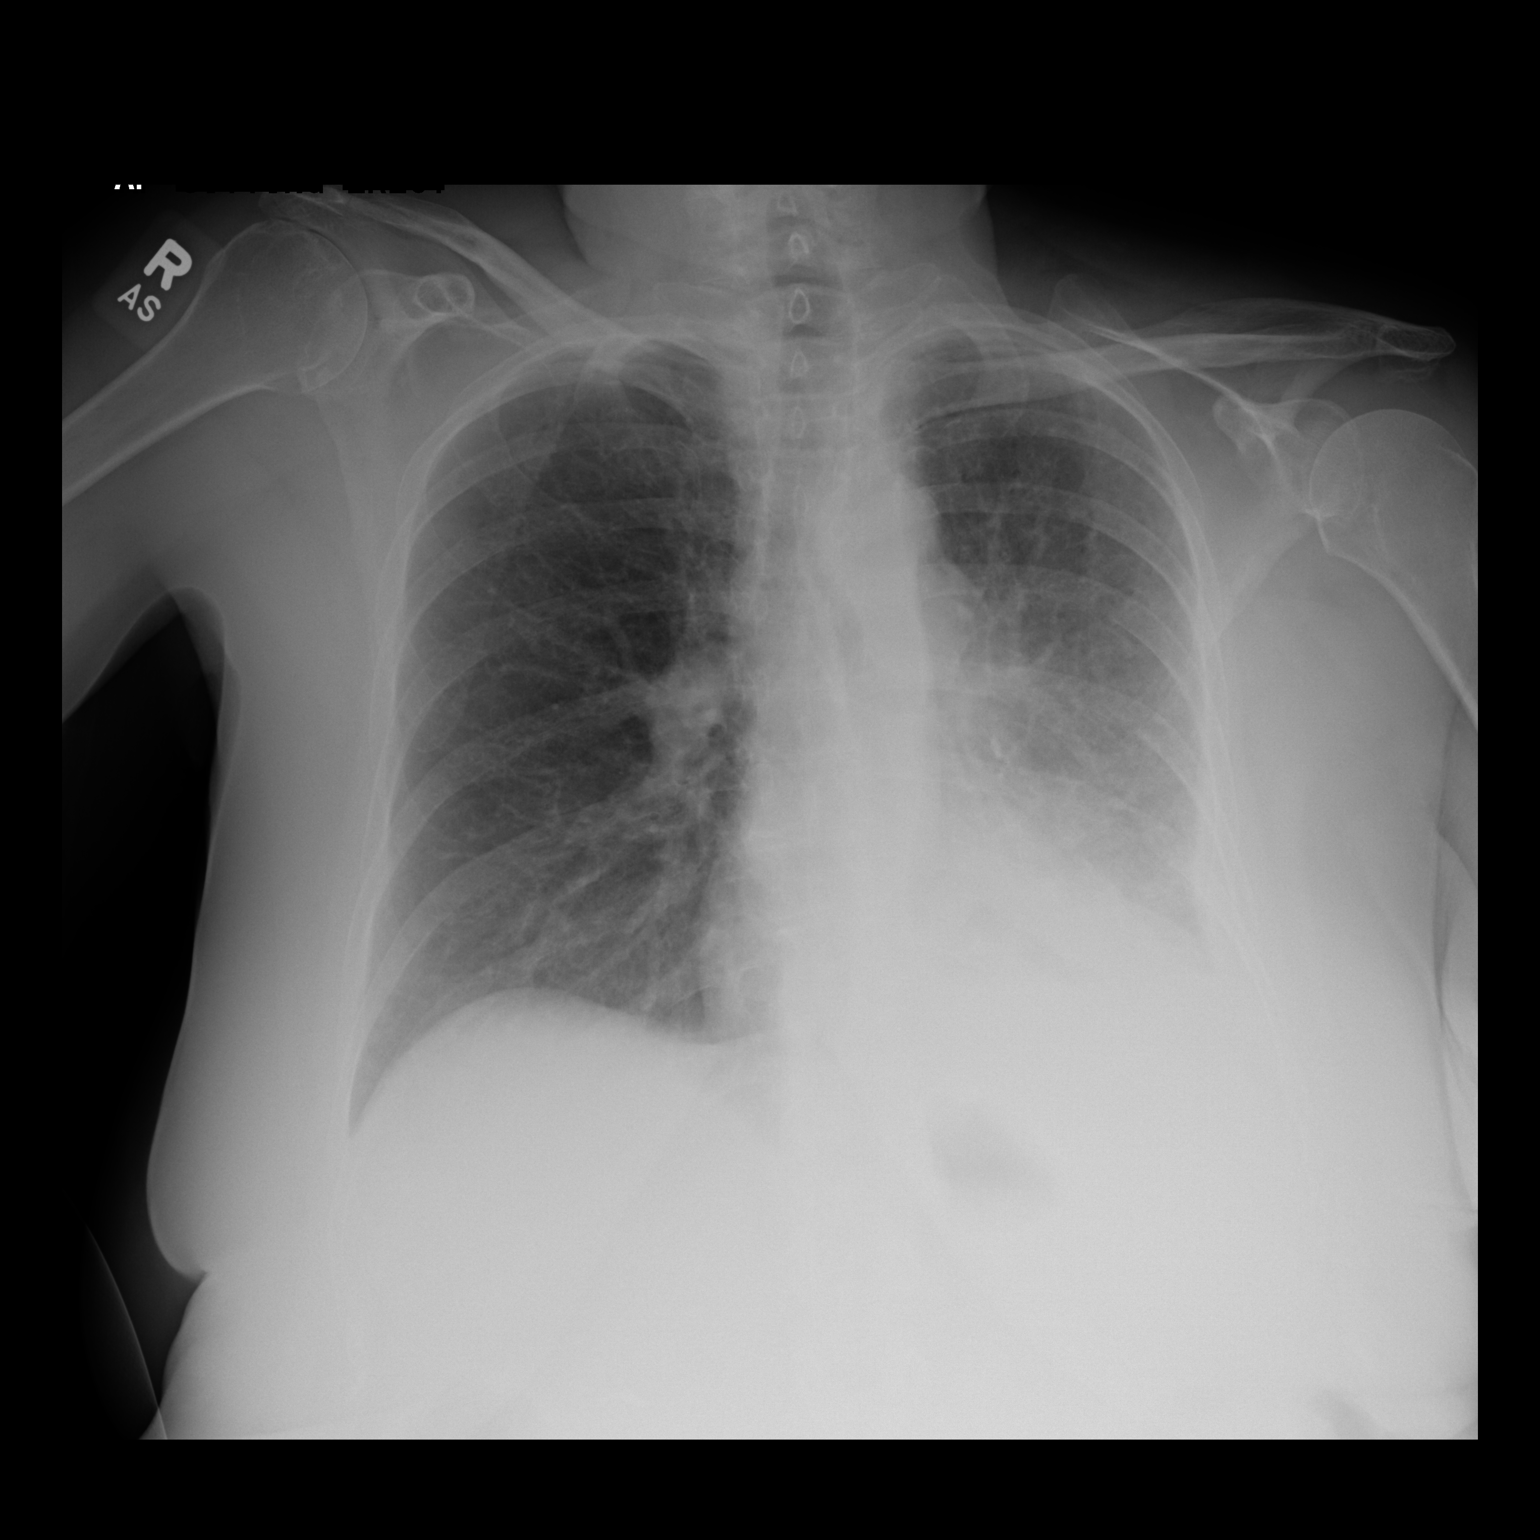

[2 of 2 positions shown; findings below may reference images not displayed]

FINDINGS: Normal heart size, mediastinal contours, and pulmonary vascularity.

Persistent LEFT pleural effusion and basilar atelectasis.

Remaining lungs clear.

No definite acute infiltrate or pneumothorax.

Osseous structures unremarkable.
IMPRESSION: Persistent LEFT pleural effusion and basilar atelectasis.

## 2022-06-28 IMAGING — CR DG CHEST 2V
2 series · 2 of 2 positions shown · non-contrast
Comparison: 09/15/2021

CLINICAL DATA: Hx node dissection 08/31/2021, some sob , previous
left lower lobectomy

EXAM:
CHEST - 2 VIEW

[w chest lat]
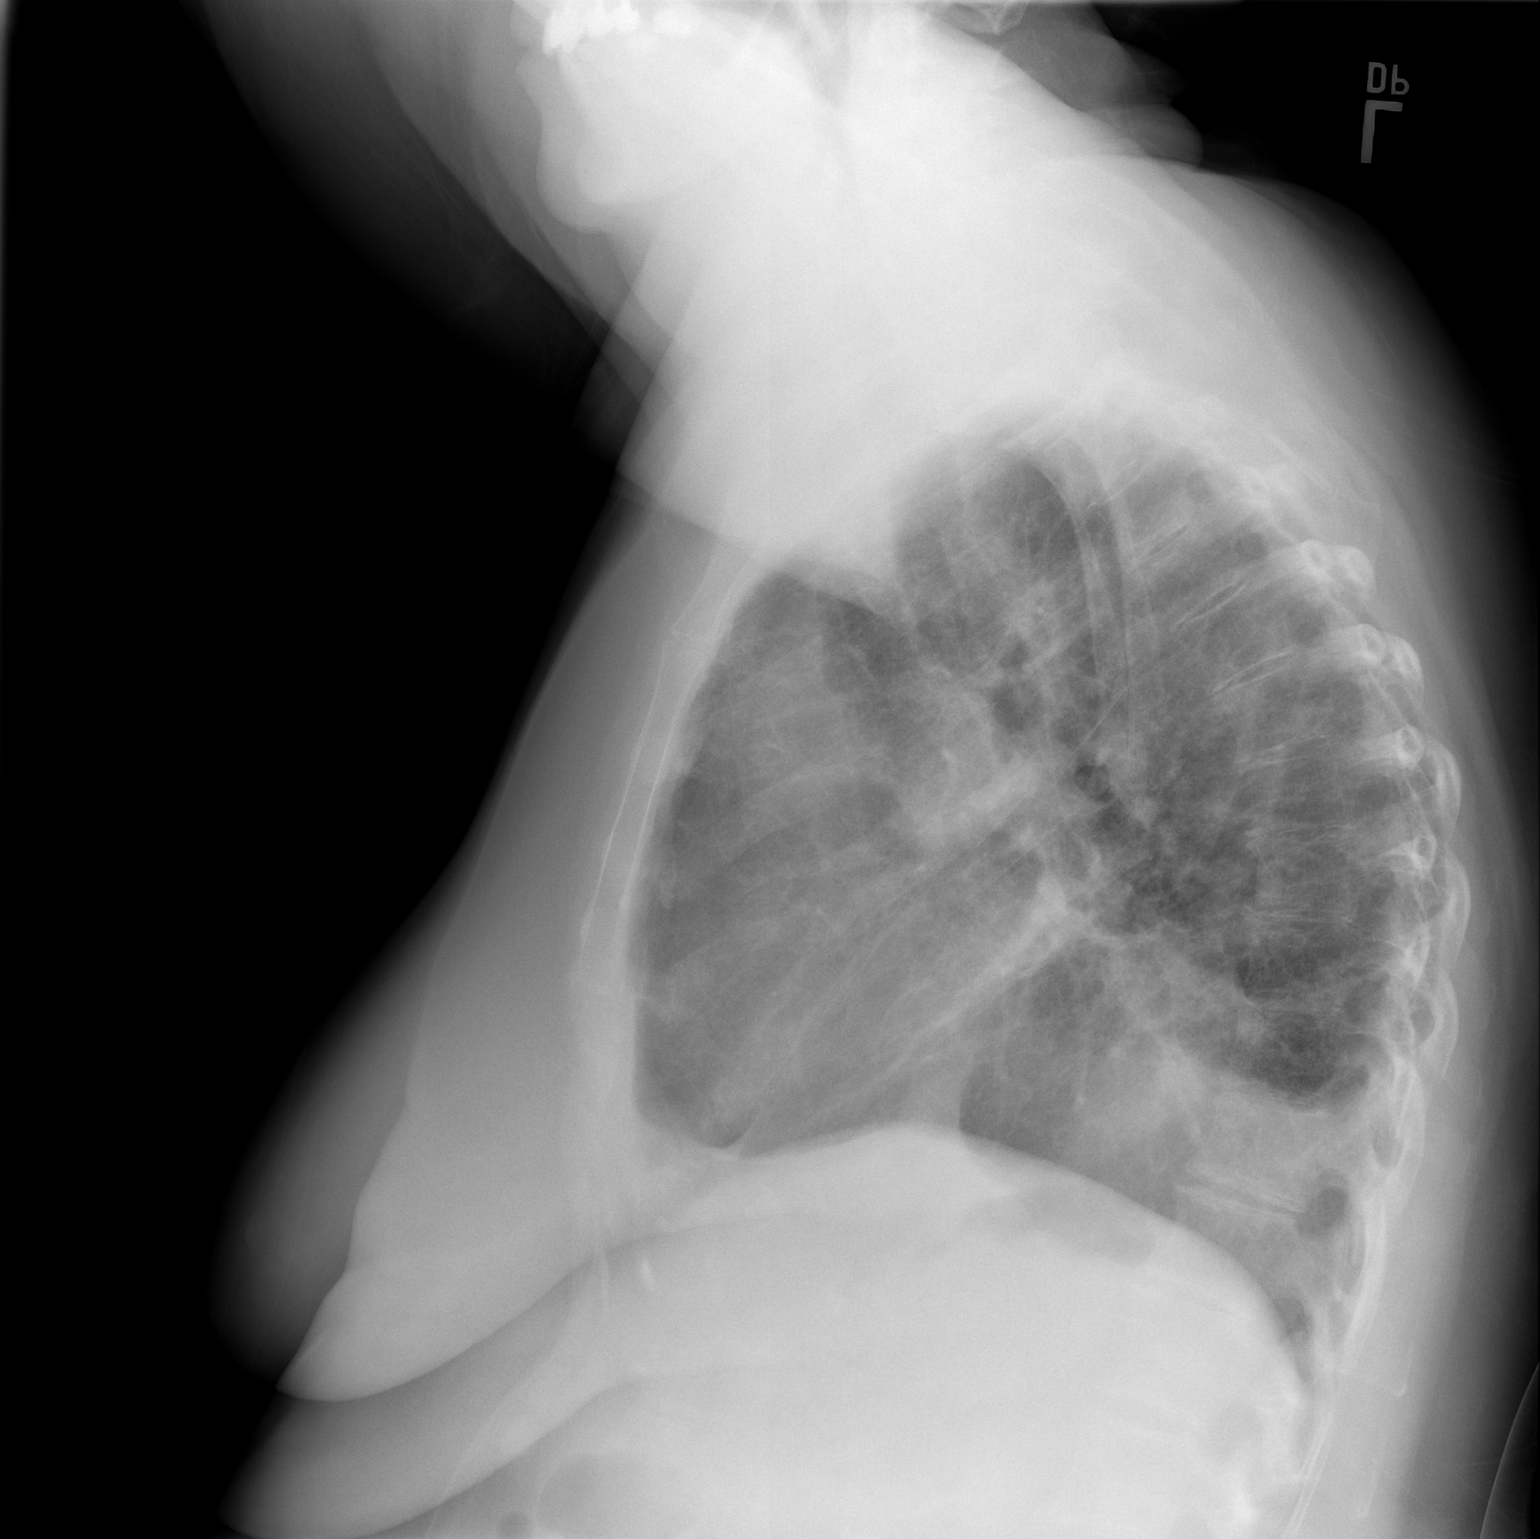

[w chest pa]
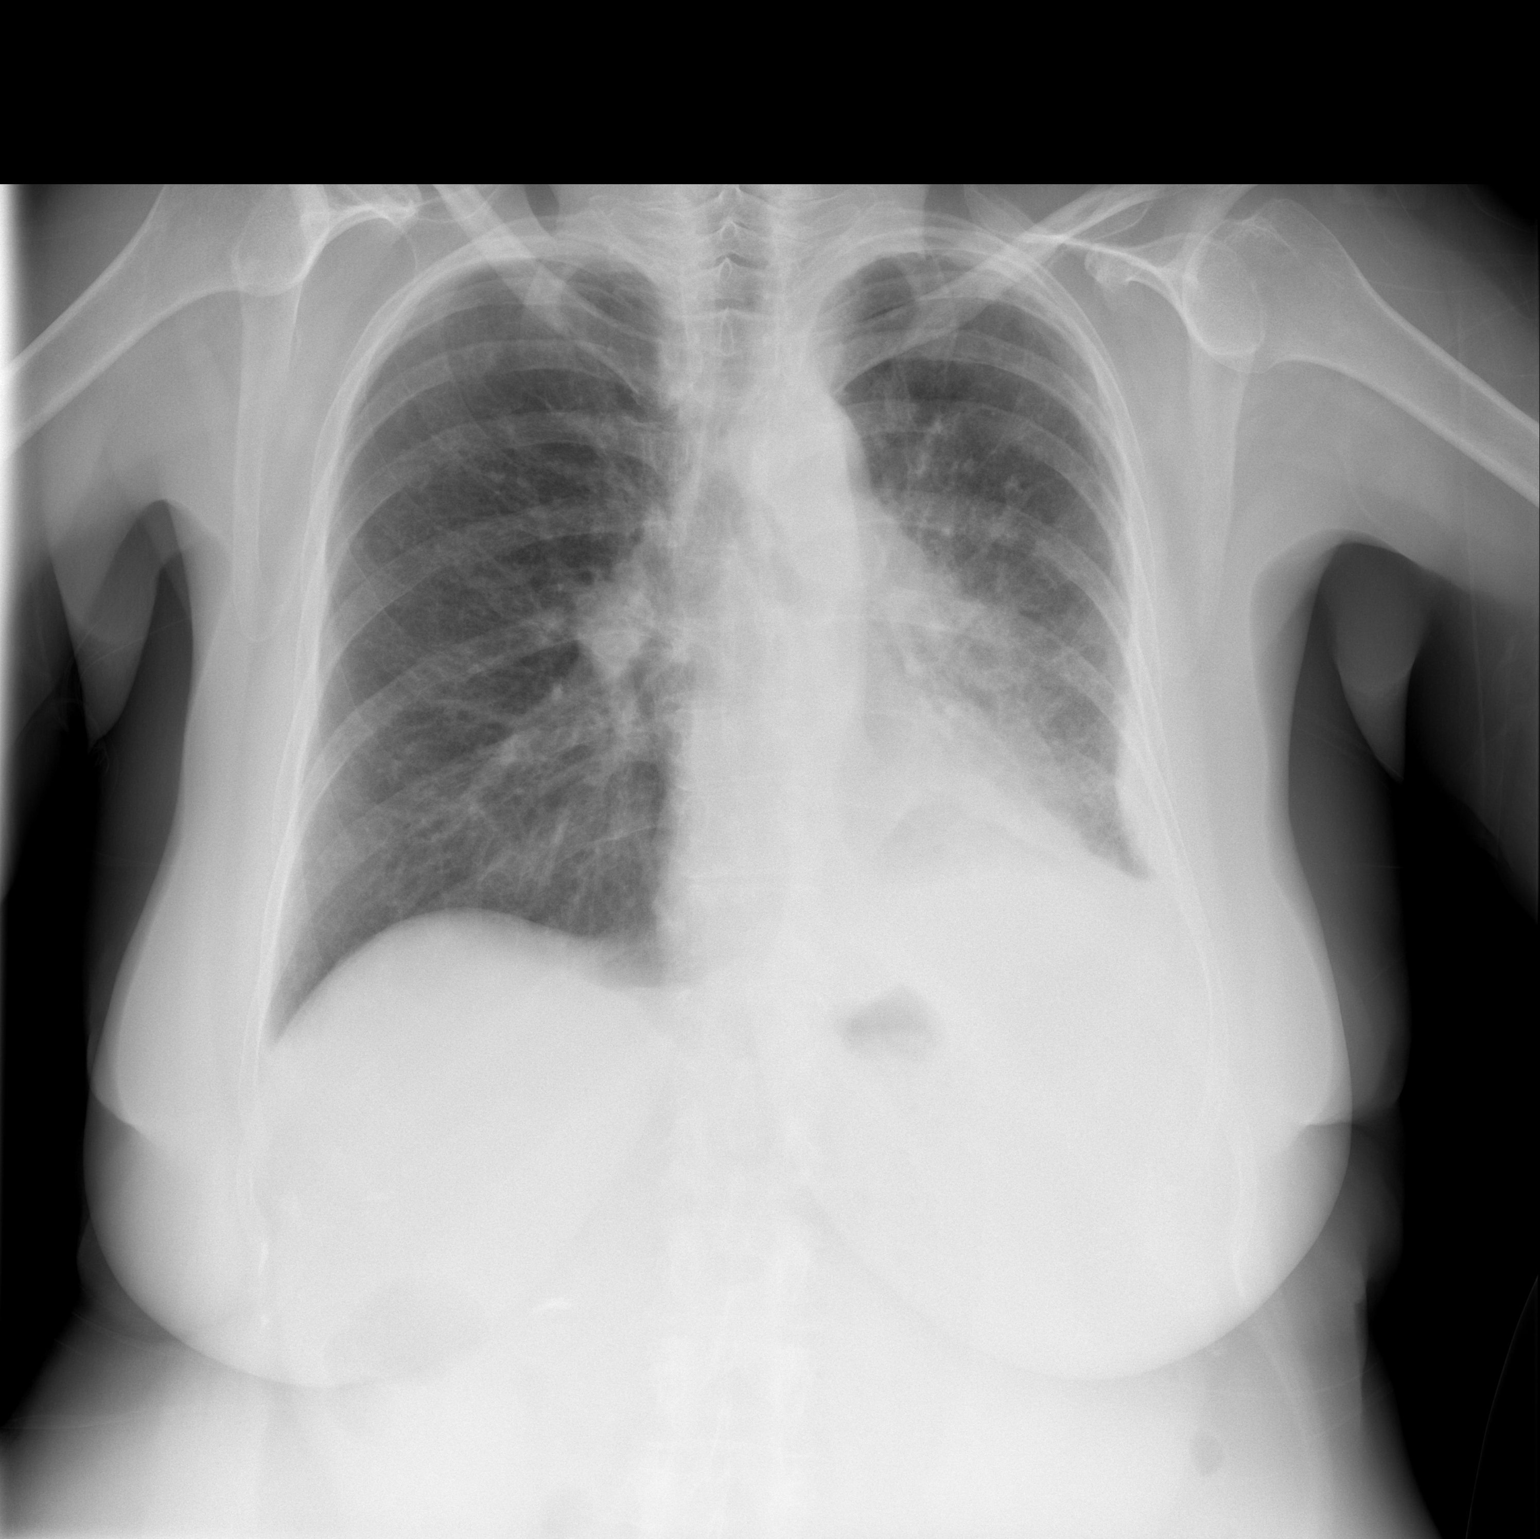

[2 of 2 positions shown; findings below may reference images not displayed]

FINDINGS: Improving airspace opacities at the left lung base. Persistent small
effusions suspected. Right lung clear.

Heart size and mediastinal contours are within normal limits.

No pneumothorax.

Visualized bones unremarkable.  Cholecystectomy clips.
IMPRESSION: Stable postop changes at the left lung base with small effusion. No
acute findings.

## 2022-07-05 ENCOUNTER — Other Ambulatory Visit: Payer: Self-pay | Admitting: Physician Assistant

## 2022-07-06 ENCOUNTER — Telehealth: Payer: Self-pay

## 2022-07-06 ENCOUNTER — Other Ambulatory Visit: Payer: Self-pay

## 2022-07-06 DIAGNOSIS — D696 Thrombocytopenia, unspecified: Secondary | ICD-10-CM

## 2022-07-06 DIAGNOSIS — K7460 Unspecified cirrhosis of liver: Secondary | ICD-10-CM

## 2022-07-06 DIAGNOSIS — G8929 Other chronic pain: Secondary | ICD-10-CM

## 2022-07-06 MED ORDER — VITAMIN D (ERGOCALCIFEROL) 1.25 MG (50000 UNIT) PO CAPS: 50000.0000 [IU] | ORAL_CAPSULE | ORAL | 1 refills | Status: DC

## 2022-07-06 MED ORDER — AMLODIPINE BESYLATE 5 MG PO TABS: 5.0000 mg | ORAL_TABLET | Freq: Every day | ORAL | 1 refills | Status: DC

## 2022-07-06 MED ORDER — OXYCODONE HCL 5 MG PO TABS: 5.0000 mg | ORAL_TABLET | Freq: Three times a day (TID) | ORAL | 0 refills | Status: DC | PRN

## 2022-07-06 MED ORDER — APIXABAN 5 MG PO TABS: ORAL_TABLET | ORAL | 2 refills | Status: DC

## 2022-07-06 MED ORDER — LOSARTAN POTASSIUM-HCTZ 100-25 MG PO TABS: 1.0000 | ORAL_TABLET | Freq: Every day | ORAL | 0 refills | Status: DC

## 2022-07-06 MED ORDER — LEVOTHYROXINE SODIUM 100 MCG PO TABS: ORAL_TABLET | ORAL | 0 refills | Status: DC

## 2022-07-06 MED ORDER — ALBUTEROL SULFATE (2.5 MG/3ML) 0.083% IN NEBU: INHALATION_SOLUTION | RESPIRATORY_TRACT | 3 refills | Status: DC

## 2022-07-06 MED ORDER — POTASSIUM CHLORIDE CRYS ER 20 MEQ PO TBCR: 40.0000 meq | EXTENDED_RELEASE_TABLET | Freq: Two times a day (BID) | ORAL | 0 refills | Status: DC

## 2022-07-06 NOTE — Progress Notes (Signed)
Care Management & Coordination Services Pharmacy Team  Reason for Encounter: Medication coordination and delivery  Contacted patient to discuss medications and coordinate delivery from [facility] pharmacy.  Spoke with patient on 07/06/2022   Cycle dispensing form sent to [facility] for review.   Last adherence delivery date:04/19/22      Patient is due for next adherence delivery on: 07/16/22  This delivery to include: Vials  90 Days  Eliquis '5mg'$  -1 tab twice daily (30DS)  Potassium Chloride 29mq 2 tabs twice daily Levothyroxine 1097m 1 tab every morning Losartan-HCTZ 100-'25mg'$  1 tab once daily Budesonide-Formot Inhaler 160-4.5 2 puffs twice daily Albuterol Inhaler 9045m1-2 puffs every 4-6 hours prn  Folic Acid '1mg'$ -1 tab once daily     Lorazepam '1mg'$ -1 tab three times daily as needed Amlodipine '5mg'$ -1 tab once daily      Vitamin D2 1.'25mg'$ -1 capsule once a week    Oxycodone '5mg'$ -Take 1 tablet (5 mg total) by mouth 3 (three) times daily as needed.   Patient declined the following medications this month: Promethazine '25mg'$ -Pt does not need currently  Refills requested from providers include: Albuterol Inhaler 81m74mosartan-HCTZ 100-'25mg'$   Potassium Chloride 20me52mevothyroxine 100mcg96miquis '5mg'$   Amlodipine '5mg'$  Vitamin D2 1.'25mg'$  Oxycodone '5mg'$   Confirmed delivery date of 07/16/22, advised patient that pharmacy will contact them the morning of delivery.   Any concerns about your medications? No  How often do you forget or accidentally miss a dose? Never  Do you use a pillbox? Yes  Is patient in packaging No  Recent blood pressure readings are as follows: 07/05/22 119/76  Chart review: Recent office visits:  None  Recent consult visits:  05/25/22 (Cardiothoracic Surgery) Seen for lung cancer. No med changes.   Hospital visits:  None  Medications: Outpatient Encounter Medications as of 07/06/2022  Medication Sig   acetaminophen (TYLENOL) 500 MG tablet Take 500 mg  by mouth every 6 (six) hours as needed (pain.).   albuterol (PROVENTIL) (2.5 MG/3ML) 0.083% nebulizer solution INHALE 1 VIAL VIA NEBULIZER 3 TO 4 TIMES A DAY AS DIRECTED   albuterol (VENTOLIN HFA) 108 (90 Base) MCG/ACT inhaler INHALE 1-2 PUFFS BY MOUTH into THE lungs every 4-6 hours AS NEEDED   amLODipine (NORVASC) 5 MG tablet Take 1 tablet (5 mg total) by mouth daily.   apixaban (ELIQUIS) 5 MG TABS tablet Take 1 tablet by mouth twice daily   betamethasone dipropionate (DIPROLENE) 0.05 % ointment Apply 1 application. topically daily as needed (skin irritation.).   folic acid (FOLVITE) 1 MG tablet TAKE ONE TABLET BY MOUTH ONCE DAILY   levothyroxine (SYNTHROID) 100 MCG tablet TAKE ONE TABLET BY MOUTH EVERY MORNING   loratadine (CLARITIN) 10 MG tablet Take by mouth.   LORazepam (ATIVAN) 1 MG tablet TAKE ONE TABLET BY MOUTH THREE TIMES DAILY AS NEEDED FOR ANXIETY   losartan-hydrochlorothiazide (HYZAAR) 100-25 MG tablet Take 1 tablet by mouth daily.   oxyCODONE (OXY IR/ROXICODONE) 5 MG immediate release tablet Take 1 tablet (5 mg total) by mouth 3 (three) times daily as needed.   Polyethyl Glycol-Propyl Glycol (LUBRICANT EYE DROPS) 0.4-0.3 % SOLN Place 1-2 drops into both eyes 3 (three) times daily as needed (dry/irritated eyes.).   potassium chloride SA (KLOR-CON M) 20 MEQ tablet Take 2 tablets (40 mEq total) by mouth 2 (two) times daily.   promethazine (PHENERGAN) 25 MG tablet Take 1 tablet (25 mg total) by mouth every 8 (eight) hours as needed for nausea or vomiting.   SYMBICORT 160-4.5 MCG/ACT  inhaler Inhale 2 puffs into the lungs 2 (two) times daily. (Patient taking differently: Inhale 1 puff into the lungs 2 (two) times daily.)   Vitamin D, Ergocalciferol, (DRISDOL) 1.25 MG (50000 UNIT) CAPS capsule Take 1 capsule (50,000 Units total) by mouth every 7 (seven) days.   No facility-administered encounter medications on file as of 07/06/2022.   BP Readings from Last 3 Encounters:  05/25/22 (!)  148/86  04/06/22 132/88  03/09/22 (!) 177/84    Pulse Readings from Last 3 Encounters:  05/25/22 98  04/06/22 (!) 107  03/09/22 (!) 126    Lab Results  Component Value Date/Time   HGBA1C 4.3 (L) 04/06/2022 10:44 AM   HGBA1C 5.3 12/28/2021 10:54 AM   Lab Results  Component Value Date   CREATININE 1.28 (H) 04/06/2022   BUN 27 04/06/2022   GFRNONAA >60 09/04/2021   GFRAA 105 04/23/2020   NA 138 04/06/2022   K 4.2 04/06/2022   CALCIUM 9.2 04/06/2022   CO2 21 04/06/2022     Elray Mcgregor, [place] Clinical Pharmacist Assistant  [phone]

## 2022-07-12 ENCOUNTER — Other Ambulatory Visit: Payer: PPO

## 2022-07-12 DIAGNOSIS — C3490 Malignant neoplasm of unspecified part of unspecified bronchus or lung: Secondary | ICD-10-CM | POA: Diagnosis not present

## 2022-07-12 DIAGNOSIS — J439 Emphysema, unspecified: Secondary | ICD-10-CM | POA: Diagnosis not present

## 2022-07-12 DIAGNOSIS — J479 Bronchiectasis, uncomplicated: Secondary | ICD-10-CM | POA: Diagnosis not present

## 2022-07-12 DIAGNOSIS — C3432 Malignant neoplasm of lower lobe, left bronchus or lung: Secondary | ICD-10-CM | POA: Diagnosis not present

## 2022-07-12 LAB — HEPATIC FUNCTION PANEL
ALT: 21 U/L (ref 7–35)
AST: 40 — AB (ref 13–35)
Alkaline Phosphatase: 55 (ref 25–125)
Bilirubin, Total: 1.1

## 2022-07-12 LAB — CBC: RBC: 3.58 — AB (ref 3.87–5.11)

## 2022-07-12 LAB — BASIC METABOLIC PANEL
BUN: 23 — AB (ref 4–21)
CO2: 24 — AB (ref 13–22)
Chloride: 102 (ref 99–108)
Creatinine: 0.9 (ref 0.5–1.1)
Glucose: 136
Potassium: 3.7 mEq/L (ref 3.5–5.1)
Sodium: 134 — AB (ref 137–147)

## 2022-07-12 LAB — CBC AND DIFFERENTIAL
HCT: 32 — AB (ref 36–46)
Hemoglobin: 11 — AB (ref 12.0–16.0)
Neutrophils Absolute: 5.61
Platelets: 97 10*3/uL — AB (ref 150–400)
WBC: 7.1

## 2022-07-12 LAB — COMPREHENSIVE METABOLIC PANEL
Albumin: 3.9 (ref 3.5–5.0)
Calcium: 8.8 (ref 8.7–10.7)

## 2022-07-12 NOTE — Progress Notes (Signed)
[facility]  [address] [place],  [place]  [address] [phone]  Clinic Day:  07/13/2022  Referring physician: [name], PA-C  HISTORY OF PRESENT ILLNESS:  The patient is a 62 y.o. female will stage IIB (T1N1 M0) lung adenocarcinoma, status post a left lower lobe lobectomy in April 2023.  With respect to her left lower lobectomy in April 2023, her mass measured 2.1 cm, with 1/17 lymph nodes being positive for disease.  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  She finished 4 cycles of adjuvant cisplatin/pemetrexed chemotherapy in September 2023.  CT scans done afterwards showed her to be disease free.  She comes in today to review her most recent chest CT.  Since her last visit, the patient has been doing much better.  She claims her energy has significantly improved the more time she has gotten from all of her adjuvant chemotherapy.  She denies having any new respiratory symptoms which concern her for early disease recurrence.     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  Labs dating back to 2011 have shown her platelet count consistently around 100K.  As she has not been severely thrombocytopenic, her platelets have been followed conservatively.    PHYSICAL EXAM:  Blood pressure 120/82, pulse (!) 130, temperature 97.9 F (36.6 C), resp. rate 16, height '5\' 1"'$  (1.549 m), weight 165 lb 14.4 oz (75.3 kg), SpO2 96 %. Wt Readings from Last 3 Encounters:  07/13/22 165 lb 14.4 oz (75.3 kg)  05/25/22 170 lb (77.1 kg)  04/06/22 173 lb (78.5 kg)   Body mass index is 31.35 kg/m. Performance status (ECOG): 1 - Symptomatic but completely ambulatory Physical Exam Constitutional:      Appearance: Normal appearance. She is not ill-appearing.  HENT:     Mouth/Throat:     Mouth: Mucous membranes are moist.     Pharynx: Oropharynx  is clear. No oropharyngeal exudate or posterior oropharyngeal erythema.  Cardiovascular:     Rate and Rhythm: Normal rate and regular rhythm.     Heart sounds: No murmur heard.    No friction rub. No gallop.  Pulmonary:     Effort: Pulmonary effort is normal. No respiratory distress.     Breath sounds: Normal breath sounds. No wheezing, rhonchi or rales.  Abdominal:     General: Bowel sounds are normal. There is no distension.     Palpations: Abdomen is soft. There is no mass.     Tenderness: There is no abdominal tenderness.  Musculoskeletal:        General: No swelling.     Right lower leg: No edema.     Left lower leg: No edema.  Lymphadenopathy:     Cervical: No cervical adenopathy.     Upper Body:     Right upper body: No supraclavicular or axillary adenopathy.     Left upper body: No supraclavicular or axillary adenopathy.     Lower Body: No right inguinal adenopathy. No left inguinal adenopathy.  Skin:    General: Skin is warm.     Coloration: Skin is not jaundiced.     Findings: No erythema, lesion or rash.     Comments:    Neurological:     General: No focal deficit present.     Mental Status: She is alert and oriented to person, place, and time.  Mental status is at baseline.  Psychiatric:        Mood and Affect: Mood normal.        Behavior: Behavior normal.        Thought Content: Thought content normal.   SCANS:  Her chest CT done yesterday revealed the following: FINDINGS: Cardiovascular: Right upper chest port. Trace pericardial fluid. The heart is nonenlarged. Coronary artery calcifications are seen. The thoracic aorta has a normal course and caliber with mild atherosclerotic plaque. There is a bovine type aortic arch which is a normal variant. There is some enlargement of the main and right pulmonary artery.  Mediastinum/Nodes: No specific abnormal lymph node enlargement identified in the axillary region or hilum. There is some mediastinal nodes  identified. Example anterior mediastinum on the left on series 2, image 31 measuring 13 by 8 mm. Previously 13 by 7 mm in retrospect. Additional nodes along the aortopulmonary window are also stable and prominent. There is fluid tracking along the mediastinum, superior pericardial recess and some ill-defined of the left hilum and mediastinum. Grossly normal caliber thoracic esophagus. Venous collaterals identified along the lower mediastinum posteriorly.  Lungs/Pleura: Emphysematous lung changes identified in the left lung than on the right. A component of peripheral interstitial septal thickening, scarring and fibrotic change. Areas of bronchiectasis. Surgical changes of left lower lobectomy volume loss and shift of the mediastinum from right to left. Areas of pleural thickening along the left hemithorax are again noted. Right lung is without consolidation, pneumothorax or effusion. Increasing reticulonodular changes seen in the peripheral right upper lobe on image 31 of series 4 for example.  Upper Abdomen: Adrenal glands are preserved in the upper abdomen. Note is made of nodular liver with splenomegaly and numerous varices including along the anterior chest and abdominal wall related to a recanalized paraumbilical vein. Also a dilated portal vein at the edge of the imaging field.  Musculoskeletal: Patient is slightly tilted in the scanner. Scattered degenerative changes of the spine. There is sclerotic lesion along the anterior aspect of the vertebral body at T8.  IMPRESSION: 1. Stable surgical changes from left-sided lobectomy. 2. Persistent left-sided pleural thickening and fluid with some small mediastinal nodes. 3. Chronic lung changes with interstitial septal thickening, fibrosis and emphysematous changes. There is slight increase in reticulonodular changes in the upper lobe on the right. Recommend simple attention on follow-up. 4. Again changes of chronic liver disease  with a nodular liver, splenomegaly and numerous varices consistent with portal hypertension.  Aortic Atherosclerosis (ICD10-I70.0) and Emphysema (ICD10-J43.9).  LABS:    ASSESSMENT & PLAN:  Assessment/Plan:  A 62 y.o. female with stage IIB (T1 N1 M0) lung adenocarcinoma, status post a left lower lobe lobectomy in April 2023.  She completed her 4 cycles of adjuvant cisplatin/pemetrexed in September 2023.  In clinic today, I went over all of her CT scan images with her, for which she could see that she remains disease-free.  Understandably, she was pleased with her scans.  Clinically, she is doing well.  Chest CTs will continue to be done every 4 months for this first year after completion of her definitive therapy.  With respect to her cytopenias, the patient does have known splenomegaly from hepatitis C cirrhosis which is likely contributing to her low counts.  However, there have been no significant changes in her peripheral counts.  I will see her back in July 2024 for repeat clinical assessment, with a chest CT being done a day before her next visit for her  continued radiographic lung cancer surveillance.  The patient understands all the plans discussed today and is in agreement with them.  Brandyn Lowrey Macarthur Critchley, MD

## 2022-07-13 ENCOUNTER — Other Ambulatory Visit: Payer: Self-pay | Admitting: Oncology

## 2022-07-13 ENCOUNTER — Telehealth: Payer: Self-pay | Admitting: Oncology

## 2022-07-13 ENCOUNTER — Inpatient Hospital Stay: Payer: PPO | Attending: Oncology | Admitting: Oncology

## 2022-07-13 VITALS — BP 120/82 | HR 130 | Temp 97.9°F | Resp 16 | Ht 61.0 in | Wt 165.9 lb

## 2022-07-13 DIAGNOSIS — C3432 Malignant neoplasm of lower lobe, left bronchus or lung: Secondary | ICD-10-CM

## 2022-07-13 NOTE — Telephone Encounter (Signed)
07/13/22 Next appts/ct scan scheduled and confirmed with patient

## 2022-07-14 ENCOUNTER — Other Ambulatory Visit: Payer: Self-pay

## 2022-07-14 ENCOUNTER — Other Ambulatory Visit: Payer: Self-pay | Admitting: Physician Assistant

## 2022-07-14 DIAGNOSIS — K7460 Unspecified cirrhosis of liver: Secondary | ICD-10-CM

## 2022-07-14 DIAGNOSIS — D696 Thrombocytopenia, unspecified: Secondary | ICD-10-CM

## 2022-07-15 ENCOUNTER — Telehealth: Payer: Self-pay

## 2022-07-15 ENCOUNTER — Other Ambulatory Visit: Payer: Self-pay | Admitting: Physician Assistant

## 2022-07-15 MED ORDER — ALBUTEROL SULFATE (2.5 MG/3ML) 0.083% IN NEBU: INHALATION_SOLUTION | RESPIRATORY_TRACT | 3 refills | Status: DC

## 2022-07-15 MED ORDER — LORAZEPAM 1 MG PO TABS: ORAL_TABLET | ORAL | 0 refills | Status: DC

## 2022-07-15 NOTE — Telephone Encounter (Signed)
Called patient, and patient states that she is only taking the lorazepam she is not taking the xanax she stated.

## 2022-07-19 ENCOUNTER — Telehealth: Payer: Self-pay

## 2022-07-19 ENCOUNTER — Ambulatory Visit (INDEPENDENT_AMBULATORY_CARE_PROVIDER_SITE_OTHER): Payer: PPO | Admitting: Physician Assistant

## 2022-07-19 ENCOUNTER — Encounter: Payer: Self-pay | Admitting: Physician Assistant

## 2022-07-19 VITALS — BP 110/60 | HR 100 | Temp 97.3°F | Ht 61.0 in | Wt 163.4 lb

## 2022-07-19 DIAGNOSIS — I10 Essential (primary) hypertension: Secondary | ICD-10-CM

## 2022-07-19 DIAGNOSIS — J4540 Moderate persistent asthma, uncomplicated: Secondary | ICD-10-CM

## 2022-07-19 DIAGNOSIS — K7460 Unspecified cirrhosis of liver: Secondary | ICD-10-CM

## 2022-07-19 DIAGNOSIS — E039 Hypothyroidism, unspecified: Secondary | ICD-10-CM | POA: Diagnosis not present

## 2022-07-19 DIAGNOSIS — D696 Thrombocytopenia, unspecified: Secondary | ICD-10-CM | POA: Diagnosis not present

## 2022-07-19 DIAGNOSIS — R7301 Impaired fasting glucose: Secondary | ICD-10-CM | POA: Diagnosis not present

## 2022-07-19 DIAGNOSIS — K219 Gastro-esophageal reflux disease without esophagitis: Secondary | ICD-10-CM | POA: Diagnosis not present

## 2022-07-19 DIAGNOSIS — E559 Vitamin D deficiency, unspecified: Secondary | ICD-10-CM | POA: Diagnosis not present

## 2022-07-19 DIAGNOSIS — C3432 Malignant neoplasm of lower lobe, left bronchus or lung: Secondary | ICD-10-CM | POA: Diagnosis not present

## 2022-07-19 NOTE — Progress Notes (Signed)
Subjective:  Patient ID: Natalie Perry, female    DOB: Jul 20, 1960  Age: 62 y.o. MRN: [id number]  Chief Complaint  Patient presents with   Hypothyroidism    Hypertension    Pt presents for follow up of hypertension. The patient is tolerating the medication well without side effects. Compliance with treatment has been good; including taking medication as directed , maintains a healthy diet  , and following up as directed. She is currently on norvasc '5mg'$  qd and hyzaar 100/25 qd -- She denies chest pain/sob/palpitations Bp slightly low in office today but pt states at home has been normal  Pt with  history of left lung adenocarcinoma and had LLL lobectomy 4/23.  She is currently following with Dr Bobby Rumpf and has  completed chemotherapy  She still has port in place with plans for removal with Dr Noberto Retort in July She states overall she is feeling well  Pt with chronic history of COPD - stable on current medications - she currently uses symbicort and uses albuterol nebulizer prn.  She follows with pulmonologist Dr Alcide Clever on regular basis Pt with history of PE in past as well and currently on Eliquis  Pt with history of hypothyroidism - currently taking synthroid 120mg qd - due for labwork   Pt with history of Vit D insufficiency - is due for labwork - is currently taking weekly supplements  Pt with known history of thrombocytopenia and follows Dr LBobby Rumpffor this issue as well - This is secondary to Hepatitis C cirrhosis and she have secondary splenomegaly  Pt with history of anxiety - stable on ativan '1mg'$  -   Pt with history of chronic knee pain - not a candidate for surgery at this time Manages her pain with oxycodone as needed  Current Outpatient Medications on File Prior to Visit  Medication Sig Dispense Refill   acetaminophen (TYLENOL) 500 MG tablet Take 500 mg by mouth every 6 (six) hours as needed (pain.).     albuterol (PROVENTIL) (2.5 MG/3ML) 0.083% nebulizer solution INHALE 1  VIAL VIA NEBULIZER 3 TO 4 TIMES A DAY AS DIRECTED 300 mL 3   albuterol (VENTOLIN HFA) 108 (90 Base) MCG/ACT inhaler INHALE 1-2 PUFFS BY MOUTH into THE lungs every 4-6 hours AS NEEDED 8.5 g 5   amLODipine (NORVASC) 5 MG tablet Take 1 tablet (5 mg total) by mouth daily. 90 tablet 1   apixaban (ELIQUIS) 5 MG TABS tablet Take 1 tablet by mouth twice daily 60 tablet 2   betamethasone dipropionate (DIPROLENE) 0.05 % ointment Apply 1 application. topically daily as needed (skin irritation.).     folic acid (FOLVITE) 1 MG tablet TAKE ONE TABLET BY MOUTH ONCE DAILY 60 tablet 3   levothyroxine (SYNTHROID) 100 MCG tablet TAKE ONE TABLET BY MOUTH EVERY MORNING 90 tablet 0   LORazepam (ATIVAN) 1 MG tablet TAKE ONE TABLET BY MOUTH THREE TIMES DAILY AS NEEDED FOR ANXIETY 90 tablet 0   losartan-hydrochlorothiazide (HYZAAR) 100-25 MG tablet Take 1 tablet by mouth daily. 90 tablet 0   oxyCODONE (OXY IR/ROXICODONE) 5 MG immediate release tablet Take 1 tablet (5 mg total) by mouth 3 (three) times daily as needed. 60 tablet 0   Polyethyl Glycol-Propyl Glycol (LUBRICANT EYE DROPS) 0.4-0.3 % SOLN Place 1-2 drops into both eyes 3 (three) times daily as needed (dry/irritated eyes.).     potassium chloride SA (KLOR-CON M) 20 MEQ tablet Take 2 tablets (40 mEq total) by mouth 2 (two) times daily. 360 tablet 0  promethazine (PHENERGAN) 25 MG tablet Take 1 tablet (25 mg total) by mouth every 8 (eight) hours as needed for nausea or vomiting. 30 tablet 2   SYMBICORT 160-4.5 MCG/ACT inhaler Inhale 2 puffs into the lungs 2 (two) times daily. (Patient taking differently: Inhale 1 puff into the lungs 2 (two) times daily.) 1 each 12   Vitamin D, Ergocalciferol, (DRISDOL) 1.25 MG (50000 UNIT) CAPS capsule Take 1 capsule (50,000 Units total) by mouth every 7 (seven) days. 12 capsule 1   No current facility-administered medications on file prior to visit.   Past Medical History:  Diagnosis Date   Anxiety    Arthritis    Asthma     Bipolar affective disorder ([facility])    Cirrhosis of liver due to hepatitis C    Hepatitis    Hypertension    Hypothyroidism    Pulmonary embolus ([place]) 10/2020   Thyroid disease    Past Surgical History:  Procedure Laterality Date   ABDOMINAL HYSTERECTOMY     ABDOMINAL SURGERY     APPENDECTOMY     CHOLECYSTECTOMY     INTERCOSTAL NERVE BLOCK Left 08/31/2021   Procedure: INTERCOSTAL NERVE BLOCK;  Surgeon: [name], MD;  Location[facility] OR;  Service: Thoracic;  Laterality: Left;   NODE DISSECTION Left 08/31/2021   Procedure: NODE DISSECTION;  Surgeon: [name], MD;  Location: [facility];  Service: Thoracic;  Laterality: Left;   XI ROBOTIC ASSISTED THORACOSCOPY- SEGMENTECTOMY Left 08/31/2021   Procedure: XI ROBOTIC ASSISTED THORACOSCOPY- LEFT LOWER LOBE LOBECTOMY;  Surgeon: [name], MD;  Location: [facility] OR;  Service: Thoracic;  Laterality: Left;    Family History  Problem Relation Age of Onset   Asthma Mother    Bipolar disorder Mother    CAD Father    Hypertension Father    Social History   Socioeconomic History   Marital status: Divorced    Spouse name: Not on file   Number of children: 2   Years of education: Not on file   Highest education level: Not on file  Occupational History   Occupation: disabled  Tobacco Use   Smoking status: Never   Smokeless tobacco: Never  Vaping Use   Vaping Use: Never used  Substance and Sexual Activity   Alcohol use: No   Drug use: Never   Sexual activity: Not on file  Other Topics Concern   Not on file  Social History Narrative   Not on file   Social Determinants of Health   Financial Resource Strain: Low Risk  (03/03/2022)   Overall Financial Resource Strain (CARDIA)    Difficulty of Paying Living Expenses: Not hard at all  Food Insecurity: No Food Insecurity (05/22/2020)   Hunger Vital Sign    Worried About Running Out of Food in the Last Year: Never true    [place] in the Last Year: Never true   Transportation Needs: No Transportation Needs (03/03/2022)   PRAPARE - Hydrologist (Medical): No    Lack of Transportation (Non-Medical): No  Physical Activity: Insufficiently Active (07/23/2021)   Exercise Vital Sign    Days of Exercise per Week: 2 days    Minutes of Exercise per Session: 30 min  Stress: Not on file  Social Connections: Not on file   CONSTITUTIONAL: Negative for chills, fatigue, fever, unintentional weight gain and unintentional weight loss.  E/N/T: Negative for ear pain, nasal congestion and sore throat.  CARDIOVASCULAR: Negative for chest  pain, dizziness, palpitations and pedal edema.  RESPIRATORY: Negative for recent cough and dyspnea.  GASTROINTESTINAL: Negative for abdominal pain, acid reflux symptoms, constipation, diarrhea, nausea and vomiting.  MSK:see HPI INTEGUMENTARY: Negative for rash.  NEUROLOGICAL: Negative for dizziness and headaches.  PSYCHIATRIC: Negative for sleep disturbance and to question depression screen.  Negative for depression, negative for anhedonia.        Objective:  PHYSICAL EXAM:   VS: BP 110/60 (BP Location: Left Arm, Patient Position: Sitting, Cuff Size: Large)   Pulse 100   Temp (!) 97.3 F (36.3 C) (Temporal)   Ht '5\' 1"'$  (1.549 m)   Wt 163 lb 6.4 oz (74.1 kg)   SpO2 96%   BMI 30.87 kg/m   GEN: Well nourished, well developed, in no acute distress   Cardiac: RRR; no murmurs, rubs, or gallops,no edema -  Respiratory:  normal respiratory rate and pattern with no distress - normal breath sounds with no rales, rhonchi, wheezes or rubs  MS: no deformity or atrophy  Skin: warm and dry, no rash   Psych: euthymic mood, appropriate affect and demeanor   Lab Results  Component Value Date   WBC 7.1 07/12/2022   HGB 11.0 (A) 07/12/2022   HCT 32 (A) 07/12/2022   PLT 97 (A) 07/12/2022   GLUCOSE 114 (H) 04/06/2022   CHOL 120 04/06/2022   TRIG 85 04/06/2022   HDL 39 (L) 04/06/2022   LDLCALC 64  04/06/2022   ALT 21 07/12/2022   AST 40 (A) 07/12/2022   NA 134 (A) 07/12/2022   K 3.7 07/12/2022   CL 102 07/12/2022   CREATININE 0.9 07/12/2022   BUN 23 (A) 07/12/2022   CO2 24 (A) 07/12/2022   TSH 4.390 04/06/2022   INR 1.3 (H) 08/28/2021   HGBA1C 4.3 (L) 04/06/2022      Assessment & Plan:   Problem List Items Addressed This Visit       Cardiovascular and Mediastinum   Benign hypertension   Relevant Medications    Continue current meds   Other Relevant Orders   CBC with Differential/Platelet   Comprehensive metabolic panel   TSH   Lipid panel     Respiratory   Lung cancer, lower lobe (HCC) (Chronic)   Relevant Orders    Continue follow up with Dr Alcide Clever and Dr Bobby Rumpf   Moderate persistent asthma without complication Continue current meds     Digestive   Hepatic cirrhosis (HCC)   Relevant Orders   Comprehensive metabolic panel     Other   Vitamin D deficiency   Relevant Orders   VITAMIN D 25 Hydroxy (Vit-D Deficiency, Fractures) Continue meds   Chronic pain of both knees   Relevant Medications   oxyCODONE (OXY IR/ROXICODONE) 5 MG immediate release tablet  Leukocytes in urine Urine culture pending      .  No orders of the defined types were placed in this encounter.   Orders Placed This Encounter  Procedures   CBC with Differential/Platelet   Comprehensive metabolic panel   Lipid panel   VITAMIN D 25 Hydroxy (Vit-D Deficiency, Fractures)   TSH     Follow-up: Return in about 3 months (around 10/19/2022) for chronic fasting follow up - 3 weeks MCR well with Maudie Mercury and bp check.  An After Visit Summary was printed and given to the patient.  Yetta Flock [facility] [phone]

## 2022-07-19 NOTE — Progress Notes (Unsigned)
Care Management & Coordination Services Pharmacy Team  Reason for Encounter: Hypertension  Contacted patient to discuss hypertension disease state. {US HC Outreach:28874}    Current antihypertensive regimen:  ***  Patient verbally confirms she is taking the above medications as directed. {yes/no:20286}  How often are you checking your Blood Pressure? {CHL HP BP Monitoring [phone]}  she checks her blood pressure {timing:25218} {before/after:25217} taking her medication.  Current home BP readings: *** Wrist or arm cuff: Caffeine intake: Salt intake: OTC medications including pseudoephedrine or NSAIDs?  Any readings above 180/100? {yes/no:20286} If yes any symptoms of hypertensive emergency? {hypertensive emergency symptoms:25354}  What recent interventions/DTPs have been made by any provider to improve Blood Pressure control since last CPP Visit: ***  Any recent hospitalizations or ED visits since last visit with CPP? No  What diet changes have been made to improve Blood Pressure Control?  ***  What exercise is being done to improve your Blood Pressure Control?  ***  Adherence Review: Is the patient currently on ACE/ARB medication? {yes/no:20286} Does the patient have >5 day gap between last estimated fill dates? {yes/no:20286}  Star Rating Drugs:  None  Chart Updates: Recent office visits:  None  Recent consult visits:  07-13-2022 [name], MD (Oncology). Follow up visit  Hospital visits:  None in previous 6 months  Medications: Outpatient Encounter Medications as of 07/19/2022  Medication Sig   acetaminophen (TYLENOL) 500 MG tablet Take 500 mg by mouth every 6 (six) hours as needed (pain.).   albuterol (PROVENTIL) (2.5 MG/3ML) 0.083% nebulizer solution INHALE 1 VIAL VIA NEBULIZER 3 TO 4 TIMES A DAY AS DIRECTED   albuterol (VENTOLIN HFA) 108 (90 Base) MCG/ACT inhaler INHALE 1-2 PUFFS BY MOUTH into THE lungs every 4-6 hours AS NEEDED    amLODipine (NORVASC) 5 MG tablet Take 1 tablet (5 mg total) by mouth daily.   apixaban (ELIQUIS) 5 MG TABS tablet Take 1 tablet by mouth twice daily   betamethasone dipropionate (DIPROLENE) 0.05 % ointment Apply 1 application. topically daily as needed (skin irritation.).   dexamethasone (DECADRON) 4 MG tablet    folic acid (FOLVITE) 1 MG tablet TAKE ONE TABLET BY MOUTH ONCE DAILY   levothyroxine (SYNTHROID) 100 MCG tablet TAKE ONE TABLET BY MOUTH EVERY MORNING   loratadine (CLARITIN) 10 MG tablet Take by mouth.   LORazepam (ATIVAN) 1 MG tablet TAKE ONE TABLET BY MOUTH THREE TIMES DAILY AS NEEDED FOR ANXIETY   losartan-hydrochlorothiazide (HYZAAR) 100-25 MG tablet Take 1 tablet by mouth daily.   moxifloxacin (AVELOX) 400 MG tablet Take by mouth.   ondansetron (ZOFRAN) 4 MG tablet Take by mouth.   ondansetron (ZOFRAN-ODT) 4 MG disintegrating tablet    oxyCODONE (OXY IR/ROXICODONE) 5 MG immediate release tablet Take 1 tablet (5 mg total) by mouth 3 (three) times daily as needed.   Polyethyl Glycol-Propyl Glycol (LUBRICANT EYE DROPS) 0.4-0.3 % SOLN Place 1-2 drops into both eyes 3 (three) times daily as needed (dry/irritated eyes.).   potassium chloride (MICRO-K) 10 MEQ CR capsule Take by mouth.   potassium chloride SA (KLOR-CON M) 20 MEQ tablet Take 2 tablets (40 mEq total) by mouth 2 (two) times daily.   predniSONE (STERAPRED UNI-PAK 21 TAB) 10 MG (21) TBPK tablet    pregabalin (LYRICA) 50 MG capsule    prochlorperazine (COMPAZINE) 10 MG tablet    promethazine (PHENERGAN) 25 MG tablet Take 1 tablet (25 mg total) by mouth every 8 (eight) hours as needed for nausea or vomiting.   SYMBICORT 160-4.5  MCG/ACT inhaler Inhale 2 puffs into the lungs 2 (two) times daily. (Patient taking differently: Inhale 1 puff into the lungs 2 (two) times daily.)   Vitamin D, Ergocalciferol, (DRISDOL) 1.25 MG (50000 UNIT) CAPS capsule Take 1 capsule (50,000 Units total) by mouth every 7 (seven) days.   zolpidem  (AMBIEN) 5 MG tablet Take by mouth.   No facility-administered encounter medications on file as of 07/19/2022.    Recent Office Vitals: BP Readings from Last 3 Encounters:  07/13/22 120/82  05/25/22 (!) 148/86  04/06/22 132/88   Pulse Readings from Last 3 Encounters:  07/13/22 (!) 130  05/25/22 98  04/06/22 (!) 107    Wt Readings from Last 3 Encounters:  07/13/22 165 lb 14.4 oz (75.3 kg)  05/25/22 170 lb (77.1 kg)  04/06/22 173 lb (78.5 kg)     Kidney Function Lab Results  Component Value Date/Time   CREATININE 0.9 07/12/2022 12:00 AM   CREATININE 1.28 (H) 04/06/2022 10:44 AM   CREATININE 1.1 03/08/2022 12:00 AM   CREATININE 1.09 (H) 12/28/2021 10:54 AM   CREATININE 0.65 12/13/2016 01:06 PM   CREATININE 0.72 07/05/2016 12:42 PM   GFRNONAA >60 09/04/2021 07:43 AM   GFRAA 105 04/23/2020 10:30 AM       Latest Ref Rng & Units 07/12/2022   12:00 AM 04/06/2022   10:44 AM 03/08/2022   12:00 AM  BMP  Glucose 70 - 99 mg/dL  114    BUN 4 - '21 23     27  '$ 36      Creatinine 0.5 - 1.1 0.9     1.28  1.1      BUN/Creat Ratio 12 - 28  21    Sodium 137 - 147 134     138  139      Potassium 3.5 - 5.1 mEq/L 3.7     4.2  3.8      Chloride 99 - 108 102     102  107      CO2 13 - '22 24     21  24      '$ Calcium 8.7 - 10.7 8.8     9.2  9.1         This result is from an external source.   07-19-2022: 1st attempt left VM  [facility] Clinical Pharmacist Assistant [phone]

## 2022-07-20 LAB — LIPID PANEL
Chol/HDL Ratio: 2.6 ratio (ref 0.0–4.4)
Cholesterol, Total: 122 mg/dL (ref 100–199)
HDL: 47 mg/dL (ref >39)
LDL Chol Calc (NIH): 62 mg/dL (ref 0–99)
Triglycerides: 63 mg/dL (ref 0–149)
VLDL Cholesterol Cal: 13 mg/dL (ref 5–40)

## 2022-07-20 LAB — CBC WITH DIFFERENTIAL/PLATELET
Basophils Absolute: 0 10*3/uL (ref 0.0–0.2)
Basos: 0 %
EOS (ABSOLUTE): 0.1 10*3/uL (ref 0.0–0.4)
Eos: 1 %
Hematocrit: 32.2 % — ABNORMAL LOW (ref 34.0–46.6)
Hemoglobin: 11.2 g/dL (ref 11.1–15.9)
Immature Grans (Abs): 0 10*3/uL (ref 0.0–0.1)
Immature Granulocytes: 0 %
Lymphocytes Absolute: 0.6 10*3/uL — ABNORMAL LOW (ref 0.7–3.1)
Lymphs: 8 %
MCH: 30.6 pg (ref 26.6–33.0)
MCHC: 34.8 g/dL (ref 31.5–35.7)
MCV: 88 fL (ref 79–97)
Monocytes Absolute: 0.5 10*3/uL (ref 0.1–0.9)
Monocytes: 7 %
Neutrophils Absolute: 6.1 10*3/uL (ref 1.4–7.0)
Neutrophils: 84 %
Platelets: 111 10*3/uL — ABNORMAL LOW (ref 150–450)
RBC: 3.66 x10E6/uL — ABNORMAL LOW (ref 3.77–5.28)
RDW: 14 % (ref 11.7–15.4)
WBC: 7.3 10*3/uL (ref 3.4–10.8)

## 2022-07-20 LAB — COMPREHENSIVE METABOLIC PANEL
ALT: 19 IU/L (ref 0–32)
AST: 29 IU/L (ref 0–40)
Albumin/Globulin Ratio: 1.5 (ref 1.2–2.2)
Albumin: 4.3 g/dL (ref 3.9–4.9)
Alkaline Phosphatase: 74 IU/L (ref 44–121)
BUN/Creatinine Ratio: 16 (ref 12–28)
BUN: 17 mg/dL (ref 8–27)
Bilirubin Total: 1.3 mg/dL — ABNORMAL HIGH (ref 0.0–1.2)
CO2: 20 mmol/L (ref 20–29)
Calcium: 9.4 mg/dL (ref 8.7–10.3)
Chloride: 98 mmol/L (ref 96–106)
Creatinine, Ser: 1.05 mg/dL — ABNORMAL HIGH (ref 0.57–1.00)
Globulin, Total: 2.9 g/dL (ref 1.5–4.5)
Glucose: 138 mg/dL — ABNORMAL HIGH (ref 70–99)
Potassium: 3.7 mmol/L (ref 3.5–5.2)
Sodium: 136 mmol/L (ref 134–144)
Total Protein: 7.2 g/dL (ref 6.0–8.5)
eGFR: 60 mL/min/{1.73_m2} (ref >59)

## 2022-07-20 LAB — TSH: TSH: 3.21 u[IU]/mL (ref 0.450–4.500)

## 2022-07-20 LAB — SPECIMEN STATUS REPORT

## 2022-07-20 LAB — VITAMIN D 25 HYDROXY (VIT D DEFICIENCY, FRACTURES): Vit D, 25-Hydroxy: 66 ng/mL (ref 30.0–100.0)

## 2022-07-20 LAB — CARDIOVASCULAR RISK ASSESSMENT

## 2022-07-20 LAB — HEMOGLOBIN A1C
Est. average glucose Bld gHb Est-mCnc: 88 mg/dL
Hgb A1c MFr Bld: 4.7 % — ABNORMAL LOW (ref 4.8–5.6)

## 2022-07-21 ENCOUNTER — Encounter: Payer: Self-pay | Admitting: Oncology

## 2022-08-09 ENCOUNTER — Ambulatory Visit: Payer: PPO | Admitting: Oncology

## 2022-08-09 ENCOUNTER — Other Ambulatory Visit: Payer: PPO

## 2022-08-16 ENCOUNTER — Other Ambulatory Visit: Payer: Self-pay | Admitting: Physician Assistant

## 2022-08-16 ENCOUNTER — Ambulatory Visit (INDEPENDENT_AMBULATORY_CARE_PROVIDER_SITE_OTHER): Payer: PPO

## 2022-08-16 VITALS — BP 118/74 | HR 108 | Resp 16 | Ht 61.0 in | Wt 163.0 lb

## 2022-08-16 DIAGNOSIS — G8929 Other chronic pain: Secondary | ICD-10-CM

## 2022-08-16 DIAGNOSIS — Z0000 Encounter for general adult medical examination without abnormal findings: Secondary | ICD-10-CM | POA: Diagnosis not present

## 2022-08-16 NOTE — Progress Notes (Signed)
Subjective:   Natalie Perry is a 62 y.o. female who presents for Medicare Annual (Subsequent) preventive examination.  This wellness visit is conducted by a nurse.  The patient's medications were reviewed and reconciled since the patient's last visit.  History details were provided by the patient.  The history appears to be reliable.    Medical History: Patient history and Family history was reviewed  Medications, Allergies, and preventative health maintenance was reviewed and updated.   Cardiac Risk Factors include: obesity (BMI >30kg/m2);hypertension     Objective:    Today's Vitals   08/16/22 1415  BP: 118/74  Pulse: (!) 108  Resp: 16  SpO2: 97%  Weight: 163 lb (73.9 kg)  Height:  (1.549 m)   Body mass index is 30.8 kg/m.     07/13/2022    2:22 PM 03/09/2022    2:32 PM 02/10/2022    1:51 PM 02/09/2022    1:07 PM 02/02/2022    3:29 PM 01/29/2022    3:23 PM 01/12/2022    9:35 AM  Advanced Directives  Does Patient Have a Medical Advance Directive? No Yes Yes Yes Yes Yes Yes  Type of Surveyor, minerals;Living will Healthcare Power of [place];Living will     Does patient want to make changes to medical advance directive?   No - Patient declined No - Patient declined     Copy of Healthcare Power of Attorney in Chart?   No - copy requested No - copy requested       Current Medications (verified) Outpatient Encounter Medications as of 08/16/2022  Medication Sig   acetaminophen (TYLENOL) 500 MG tablet Take 500 mg by mouth every 6 (six) hours as needed (pain.).   albuterol (PROVENTIL) (2.5 MG/3ML) 0.083% nebulizer solution INHALE 1 VIAL VIA NEBULIZER 3 TO 4 TIMES A DAY AS DIRECTED   albuterol (VENTOLIN HFA) 108 (90 Base) MCG/ACT inhaler INHALE 1-2 PUFFS BY MOUTH into THE lungs every 4-6 hours AS NEEDED   amLODipine (NORVASC) 5 MG tablet Take 1 tablet (5 mg total) by mouth daily.   apixaban (ELIQUIS) 5 MG TABS tablet Take 1 tablet by mouth twice  daily   betamethasone dipropionate (DIPROLENE) 0.05 % ointment Apply 1 application. topically daily as needed (skin irritation.).   folic acid (FOLVITE) 1 MG tablet TAKE ONE TABLET BY MOUTH ONCE DAILY   levothyroxine (SYNTHROID) 100 MCG tablet TAKE ONE TABLET BY MOUTH EVERY MORNING   LORazepam (ATIVAN) 1 MG tablet TAKE ONE TABLET BY MOUTH THREE TIMES DAILY AS NEEDED FOR ANXIETY   losartan-hydrochlorothiazide (HYZAAR) 100-25 MG tablet Take 1 tablet by mouth daily.   oxyCODONE (OXY IR/ROXICODONE) 5 MG immediate release tablet Take 1 tablet (5 mg total) by mouth 3 (three) times daily as needed.   Polyethyl Glycol-Propyl Glycol (LUBRICANT EYE DROPS) 0.4-0.3 % SOLN Place 1-2 drops into both eyes 3 (three) times daily as needed (dry/irritated eyes.).   potassium chloride SA (KLOR-CON M) 20 MEQ tablet Take 2 tablets (40 mEq total) by mouth 2 (two) times daily.   promethazine (PHENERGAN) 25 MG tablet Take 1 tablet (25 mg total) by mouth every 8 (eight) hours as needed for nausea or vomiting.   SYMBICORT 160-4.5 MCG/ACT inhaler Inhale 2 puffs into the lungs 2 (two) times daily. (Patient taking differently: Inhale 1 puff into the lungs 2 (two) times daily.)   Vitamin D, Ergocalciferol, (DRISDOL) 1.25 MG (50000 UNIT) CAPS capsule Take 1 capsule (50,000 Units total) by mouth  every 7 (seven) days.   [DISCONTINUED] oxyCODONE (OXY IR/ROXICODONE) 5 MG immediate release tablet Take 1 tablet (5 mg total) by mouth 3 (three) times daily as needed.   No facility-administered encounter medications on file as of 08/16/2022.    Allergies (verified) Penicillins and Nicotine   History: Past Medical History:  Diagnosis Date   Anxiety    Arthritis    Asthma    Bipolar affective disorder    Cirrhosis of liver due to hepatitis C    Hepatitis    Hypertension    Hypothyroidism    Pulmonary embolus 10/2020   Thyroid disease    Past Surgical History:  Procedure Laterality Date   ABDOMINAL HYSTERECTOMY     ABDOMINAL  SURGERY     APPENDECTOMY     CHOLECYSTECTOMY     INTERCOSTAL NERVE BLOCK Left 08/31/2021   Procedure: INTERCOSTAL NERVE BLOCK;  Surgeon: Loreli Slot, MD;  Location: [facility] OR;  Service: Thoracic;  Laterality: Left;   NODE DISSECTION Left 08/31/2021   Procedure: NODE DISSECTION;  Surgeon: Loreli Slot, MD;  Location: [facility] OR;  Service: Thoracic;  Laterality: Left;   XI ROBOTIC ASSISTED THORACOSCOPY- SEGMENTECTOMY Left 08/31/2021   Procedure: XI ROBOTIC ASSISTED THORACOSCOPY- LEFT LOWER LOBE LOBECTOMY;  Surgeon: Loreli Slot, MD;  Location: [facility] OR;  Service: Thoracic;  Laterality: Left;   Family History  Problem Relation Age of Onset   Asthma Mother    Bipolar disorder Mother    CAD Father    Hypertension Father    Social History   Socioeconomic History   Marital status: Divorced    Spouse name: Not on file   Number of children: 2   Years of education: Not on file   Highest education level: Not on file  Occupational History   Occupation: disabled  Tobacco Use   Smoking status: Never   Smokeless tobacco: Never  Vaping Use   Vaping Use: Never used  Substance and Sexual Activity   Alcohol use: No   Drug use: Never   Sexual activity: Not on file  Other Topics Concern   Not on file  Social History Narrative   Not on file   Social Determinants of Health   Financial Resource Strain: Low Risk  (08/16/2022)   Overall Financial Resource Strain (CARDIA)    Difficulty of Paying Living Expenses: Not hard at all  Food Insecurity: No Food Insecurity (08/16/2022)   Hunger Vital Sign    Worried About Running Out of Food in the Last Year: Never true    Ran Out of Food in the Last Year: Never true  Transportation Needs: No Transportation Needs (08/16/2022)   PRAPARE - Administrator, Civil Service (Medical): No    Lack of Transportation (Non-Medical): No  Physical Activity: Sufficiently Active (08/16/2022)   Exercise Vital Sign    Days of Exercise per Week: 6  days    Minutes of Exercise per Session: 30 min  Stress: No Stress Concern Present (08/16/2022)   [employer] of Occupational Health - Occupational Stress Questionnaire    Feeling of Stress : Not at all  Social Connections: Not on file    Tobacco Counseling Counseling given: Not Answered   Clinical Intake:  Pre-visit preparation completed: Yes Pain : No/denies pain   BMI - recorded: 30.8 Nutritional Status: BMI > 30  Obese Nutritional Risks: None Diabetes: No How often do you need to have someone help you when you read instructions, pamphlets, or other written  materials from your doctor or pharmacy?: 1 - Never Interpreter Needed?: No     Activities of Daily Living    08/16/2022    2:18 PM 08/28/2021    1:38 PM  In your present state of health, do you have any difficulty performing the following activities:  Hearing? 0   Vision? 0   Difficulty concentrating or making decisions? 0   Walking or climbing stairs? 1   Dressing or bathing? 0   Doing errands, shopping? 0 0  Preparing Food and eating ? N   Using the Toilet? N   In the past six months, have you accidently leaked urine? Y   Do you have problems with loss of bowel control? N   Managing your Medications? N   Managing your Finances? N   Housekeeping or managing your Housekeeping? N     Patient Care Team: Marianne Sofiaavis, Sara, Cordelia PochePA-C as PCP - General (Physician Assistant) Weston SettleLewis, Dequincy A, MD as Consulting Physician (Oncology) Marcellus Scotthodri, Tanvir, MD as Referring Physician (Pulmonary Disease) Zettie PhoKennedy, Nathan K, [facility] (Pharmacist)     Assessment:   This is a routine wellness examination for Fleet ContrasRachel.  Hearing/Vision screen No results found.  Dietary issues and exercise activities discussed: Current Exercise Habits: Home exercise routine, Type of exercise: walking;Other - see comments (exercise bike), Time (Minutes): 30, Frequency (Times/Week): 6, Weekly Exercise (Minutes/Week): 180, Intensity: Mild, Exercise limited by:  None identified   Depression Screen    08/16/2022    2:32 PM 07/19/2022   10:02 AM 04/06/2022   10:15 AM 07/23/2021   10:28 AM 01/07/2021   11:13 AM 05/22/2020    2:32 PM 04/23/2020   10:11 AM  PHQ 2/9 Scores  PHQ - 2 Score 0 0 0 0 0 0 0  PHQ- 9 Score 0 0 0        Fall Risk    08/16/2022    2:37 PM 07/19/2022   10:02 AM 04/06/2022   10:16 AM 07/23/2021   10:30 AM 01/07/2021   11:13 AM  Fall Risk   Falls in the past year? 0 0 0 0 0  Number falls in past yr: 0 0 0 0 0  Injury with Fall? 0 0 0 0 0  Risk for fall due to : Impaired balance/gait No Fall Risks Impaired balance/gait Impaired balance/gait   Follow up Falls evaluation completed;Education provided Falls evaluation completed Falls evaluation completed Falls evaluation completed;Education provided;Falls prevention discussed     FALL RISK PREVENTION PERTAINING TO THE HOME:  Any stairs in or around the home? No  If so, are there any without handrails? No  Home free of loose throw rugs in walkways, pet beds, electrical cords, etc? Yes  Adequate lighting in your home to reduce risk of falls? Yes   ASSISTIVE DEVICES UTILIZED TO PREVENT FALLS:  Use of a cane, walker or w/c? Yes  Grab bars in the bathroom? Yes  Shower chair or bench in shower? Yes  Elevated toilet seat or a handicapped toilet? No   Gait slow and steady with assistive device  Cognitive Function:        08/16/2022    2:39 PM 07/23/2021   10:32 AM  6CIT Screen  What Year? 0 points 0 points  What month? 0 points 0 points  What time? 0 points 0 points  Count back from 20 0 points 0 points  Months in reverse 2 points 2 points  Repeat phrase 2 points 2 points  Total Score 4 points 4 points  Immunizations Immunization History  Administered Date(s) Administered   Influenza Inj Mdck Quad Pf 01/16/2020, 01/07/2021, 04/06/2022   Influenza-Unspecified 02/08/2019   PFIZER(Purple Top)SARS-COV-2 Vaccination 08/10/2019, 08/31/2019, 06/26/2020   Pneumococcal  Conjugate-13 03/18/2014   Pneumococcal Polysaccharide-23 01/16/2020   Pneumococcal-Unspecified 04/06/2011   Tdap 01/31/2012    TDAP status: Due, Education has been provided regarding the importance of this vaccine. Advised may receive this vaccine at local pharmacy or Health Dept. Aware to provide a copy of the vaccination record if obtained from local pharmacy or Health Dept. Verbalized acceptance and understanding.  Flu Vaccine status: Up to date  Covid-19 vaccine status: Declined, Education has been provided regarding the importance of this vaccine but patient still declined. Advised may receive this vaccine at local pharmacy or Health Dept.or vaccine clinic. Aware to provide a copy of the vaccination record if obtained from local pharmacy or Health Dept. Verbalized acceptance and understanding.  Qualifies for Shingles Vaccine? Yes   Zostavax completed No   Shingrix Completed?: No.    Education has been provided regarding the importance of this vaccine. Patient has been advised to call insurance company to determine out of pocket expense if they have not yet received this vaccine. Advised may also receive vaccine at local pharmacy or Health Dept. Verbalized acceptance and understanding.  Screening Tests Health Maintenance  Topic Date Due   DTaP/Tdap/Td (2 - Td or Tdap) 01/30/2022   Medicare Annual Wellness (AWV)  07/23/2022   MAMMOGRAM  11/28/2023 (Originally 06/22/2022)   INFLUENZA VACCINE  12/09/2022   Fecal DNA (Cologuard)  09/21/2024   Hepatitis C Screening  Completed   HPV VACCINES  Aged Out   COVID-19 Vaccine  Discontinued   HIV Screening  Discontinued   Zoster Vaccines- Shingrix  Discontinued    Health Maintenance  Health Maintenance Due  Topic Date Due   DTaP/Tdap/Td (2 - Td or Tdap) 01/30/2022   Medicare Annual Wellness (AWV)  07/23/2022    Colorectal cancer screening: Type of screening: Cologuard. Completed 09/2021. Repeat every 3 years  Mammogram status:  Completed 06/22/21. Repeat every year  Lung Cancer Screening: (Low Dose CT Chest recommended if Age 8-80 years, 30 pack-year currently smoking OR have quit w/in 15years.) does not qualify.   Lung Cancer Screening Referral: N/A  Additional Screening:  Vision Screening: Recommended annual ophthalmology exams for early detection of glaucoma and other disorders of the eye. Is the patient up to date with their annual eye exam?  Yes   Dental Screening: Recommended annual dental exams for proper oral hygiene  Community Resource Referral / Chronic Care Management: CRR required this visit?  No   CCM required this visit?  No      Plan:    1- Tetanus and Shingrix vaccines are due - you can get these at the pharmacy  I have personally reviewed and noted the following in the patient's chart:   Medical and social history Use of alcohol, tobacco or illicit drugs  Current medications and supplements including opioid prescriptions.  Functional ability and status Nutritional status Physical activity Advanced directives List of other physicians Hospitalizations, surgeries, and ER visits in previous 12 months Vitals Screenings to include cognitive, depression, and falls Referrals and appointments  In addition, I have reviewed and discussed with patient certain preventive protocols, quality metrics, and best practice recommendations. A written personalized care plan for preventive services as well as general preventive health recommendations were provided to patient.     Jacklynn Bue, LPN   05/18/1476

## 2022-08-16 NOTE — Patient Instructions (Signed)
Ms. Natalie Perry , Thank you for taking time to come for your Medicare Wellness Visit. I appreciate your ongoing commitment to your health goals. Please review the following plan we discussed and let me know if I can assist you in the future.   Screening recommendations/referrals: Cologuard: Due 09/2024 Mammogram: Due Recommended yearly ophthalmology/optometry visit for glaucoma screening and checkup Recommended yearly dental visit for hygiene and checkup  Vaccinations: Influenza vaccine: Due Tdap vaccine: Due - you can get this at the pharmacy Shingles vaccine: Due - you can get this at the pharmacy     Preventive Care 40-64 Years, Female Preventive care refers to lifestyle choices and visits with your health care provider that can promote health and wellness. What does preventive care include? A yearly physical exam. This is also called an annual well check. Dental exams once or twice a year. Routine eye exams. Ask your health care provider how often you should have your eyes checked. Personal lifestyle choices, including: Daily care of your teeth and gums. Regular physical activity. Eating a healthy diet. Avoiding tobacco and drug use. Limiting alcohol use. Practicing safe sex. Taking low-dose aspirin daily starting at age 43. Taking vitamin and mineral supplements as recommended by your health care provider. What happens during an annual well check? The services and screenings done by your health care provider during your annual well check will depend on your age, overall health, lifestyle risk factors, and family history of disease. Counseling  Your health care provider may ask you questions about your: Alcohol use. Tobacco use. Drug use. Emotional well-being. Home and relationship well-being. Sexual activity. Eating habits. Work and work Astronomer. Method of birth control. Menstrual cycle. Pregnancy history. Screening  You may have the following tests or  measurements: Height, weight, and BMI. Blood pressure. Lipid and cholesterol levels. These may be checked every 5 years, or more frequently if you are over 77 years old. Skin check. Lung cancer screening. You may have this screening every year starting at age 39 if you have a 30-pack-year history of smoking and currently smoke or have quit within the past 15 years. Fecal occult blood test (FOBT) of the stool. You may have this test every year starting at age 78. Flexible sigmoidoscopy or colonoscopy. You may have a sigmoidoscopy every 5 years or a colonoscopy every 10 years starting at age 38. Hepatitis C blood test. Hepatitis B blood test. Sexually transmitted disease (STD) testing. Diabetes screening. This is done by checking your blood sugar (glucose) after you have not eaten for a while (fasting). You may have this done every 1-3 years. Mammogram. This may be done every 1-2 years. Talk to your health care provider about when you should start having regular mammograms. This may depend on whether you have a family history of breast cancer. BRCA-related cancer screening. This may be done if you have a family history of breast, ovarian, tubal, or peritoneal cancers. Pelvic exam and Pap test. This may be done every 3 years starting at age 70. Starting at age 78, this may be done every 5 years if you have a Pap test in combination with an HPV test. Bone density scan. This is done to screen for osteoporosis. You may have this scan if you are at high risk for osteoporosis. Discuss your test results, treatment options, and if necessary, the need for more tests with your health care provider. Vaccines  Your health care provider may recommend certain vaccines, such as: Influenza vaccine. This is recommended every year.  Tetanus, diphtheria, and acellular pertussis (Tdap, Td) vaccine. You may need a Td booster every 10 years. Zoster vaccine. You may need this after age 63. Pneumococcal 13-valent  conjugate (PCV13) vaccine. You may need this if you have certain conditions and were not previously vaccinated. Pneumococcal polysaccharide (PPSV23) vaccine. You may need one or two doses if you smoke cigarettes or if you have certain conditions. Talk to your health care provider about which screenings and vaccines you need and how often you need them. This information is not intended to replace advice given to you by your health care provider. Make sure you discuss any questions you have with your health care provider. Document Released: 05/23/2015 Document Revised: 01/14/2016 Document Reviewed: 02/25/2015 Elsevier Interactive Patient Education  2017 [employer]    Fall Prevention in the Home Falls can cause injuries. They can happen to people of all ages. There are many things you can do to make your home safe and to help prevent falls. What can I do on the outside of my home? Regularly fix the edges of walkways and driveways and fix any cracks. Remove anything that might make you trip as you walk through a door, such as a raised step or threshold. Trim any bushes or trees on the path to your home. Use bright outdoor lighting. Clear any walking paths of anything that might make someone trip, such as rocks or tools. Regularly check to see if handrails are loose or broken. Make sure that both sides of any steps have handrails. Any raised decks and porches should have guardrails on the edges. Have any leaves, snow, or ice cleared regularly. Use sand or salt on walking paths during winter. Clean up any spills in your garage right away. This includes oil or grease spills. What can I do in the bathroom? Use night lights. Install grab bars by the toilet and in the tub and shower. Do not use towel bars as grab bars. Use non-skid mats or decals in the tub or shower. If you need to sit down in the shower, use a plastic, non-slip stool. Keep the floor dry. Clean up any water that spills on the  floor as soon as it happens. Remove soap buildup in the tub or shower regularly. Attach bath mats securely with double-sided non-slip rug tape. Do not have throw rugs and other things on the floor that can make you trip. What can I do in the bedroom? Use night lights. Make sure that you have a light by your bed that is easy to reach. Do not use any sheets or blankets that are too big for your bed. They should not hang down onto the floor. Have a firm chair that has side arms. You can use this for support while you get dressed. Do not have throw rugs and other things on the floor that can make you trip. What can I do in the kitchen? Clean up any spills right away. Avoid walking on wet floors. Keep items that you use a lot in easy-to-reach places. If you need to reach something above you, use a strong step stool that has a grab bar. Keep electrical cords out of the way. Do not use floor polish or wax that makes floors slippery. If you must use wax, use non-skid floor wax. Do not have throw rugs and other things on the floor that can make you trip. What can I do with my stairs? Do not leave any items on the stairs. Make sure that  there are handrails on both sides of the stairs and use them. Fix handrails that are broken or loose. Make sure that handrails are as long as the stairways. Check any carpeting to make sure that it is firmly attached to the stairs. Fix any carpet that is loose or worn. Avoid having throw rugs at the top or bottom of the stairs. If you do have throw rugs, attach them to the floor with carpet tape. Make sure that you have a light switch at the top of the stairs and the bottom of the stairs. If you do not have them, ask someone to add them for you. What else can I do to help prevent falls? Wear shoes that: Do not have high heels. Have rubber bottoms. Are comfortable and fit you well. Are closed at the toe. Do not wear sandals. If you use a stepladder: Make sure that  it is fully opened. Do not climb a closed stepladder. Make sure that both sides of the stepladder are locked into place. Ask someone to hold it for you, if possible. Clearly mark and make sure that you can see: Any grab bars or handrails. First and last steps. Where the edge of each step is. Use tools that help you move around (mobility aids) if they are needed. These include: Canes. Walkers. Scooters. Crutches. Turn on the lights when you go into a dark area. Replace any light bulbs as soon as they burn out. Set up your furniture so you have a clear path. Avoid moving your furniture around. If any of your floors are uneven, fix them. If there are any pets around you, be aware of where they are. Review your medicines with your doctor. Some medicines can make you feel dizzy. This can increase your chance of falling. Ask your doctor what other things that you can do to help prevent falls. This information is not intended to replace advice given to you by your health care provider. Make sure you discuss any questions you have with your health care provider. Document Released: 02/20/2009 Document Revised: 10/02/2015 Document Reviewed: 05/31/2014 Elsevier Interactive Patient Education  2017 [employer].

## 2022-09-06 DIAGNOSIS — R051 Acute cough: Secondary | ICD-10-CM | POA: Diagnosis not present

## 2022-09-06 DIAGNOSIS — I10 Essential (primary) hypertension: Secondary | ICD-10-CM | POA: Diagnosis not present

## 2022-09-06 DIAGNOSIS — M62838 Other muscle spasm: Secondary | ICD-10-CM | POA: Diagnosis not present

## 2022-09-06 DIAGNOSIS — R9431 Abnormal electrocardiogram [ECG] [EKG]: Secondary | ICD-10-CM | POA: Diagnosis not present

## 2022-09-06 DIAGNOSIS — R079 Chest pain, unspecified: Secondary | ICD-10-CM | POA: Diagnosis not present

## 2022-09-06 DIAGNOSIS — R000 Tachycardia, unspecified: Secondary | ICD-10-CM | POA: Diagnosis not present

## 2022-09-07 DIAGNOSIS — I10 Essential (primary) hypertension: Secondary | ICD-10-CM | POA: Diagnosis not present

## 2022-09-07 DIAGNOSIS — R079 Chest pain, unspecified: Secondary | ICD-10-CM | POA: Diagnosis not present

## 2022-09-07 DIAGNOSIS — R000 Tachycardia, unspecified: Secondary | ICD-10-CM | POA: Diagnosis not present

## 2022-09-07 DIAGNOSIS — M62838 Other muscle spasm: Secondary | ICD-10-CM | POA: Diagnosis not present

## 2022-09-07 DIAGNOSIS — R051 Acute cough: Secondary | ICD-10-CM | POA: Diagnosis not present

## 2022-09-07 DIAGNOSIS — R9431 Abnormal electrocardiogram [ECG] [EKG]: Secondary | ICD-10-CM | POA: Diagnosis not present

## 2022-09-10 ENCOUNTER — Telehealth: Payer: Self-pay

## 2022-09-10 NOTE — Progress Notes (Signed)
Care Management & Coordination Services Pharmacy Team  Reason for Encounter: Hypertension  Contacted patient to discuss hypertension disease state. Spoke with patient on 09/10/2022     Current antihypertensive regimen:  Amlodipine 5 mg daily  Losartan/HCTZ 100-25 mg daily   Patient verbally confirms she is taking the above medications as directed. Yes  How often are you checking your Blood Pressure? daily  she checks her blood pressure in the morning before taking her medication.  Current home BP readings: 05-03 122/77, 05-02 118/80, 05-01 116/72, 04-30 118/78 Wrist or arm cuff: Wrist Caffeine intake:Patient stated she drinks green tea was drinking a cup daily but she has cut back a few weekly  Salt intake: Limited OTC medications including pseudoephedrine or NSAIDs? None  Any readings above 180/100? No   What recent interventions/DTPs have been made by any provider to improve Blood Pressure control since last CPP Visit:  Patient stated she has cut back on caffeine   Any recent hospitalizations or ED visits since last visit with CPP? No  What diet changes have been made to improve Blood Pressure Control?  Patient drinks plenty of water and limits salt intake   What exercise is being done to improve your Blood Pressure Control?  Patient stated she walks some   Adherence Review: Is the patient currently on ACE/ARB medication? Yes Does the patient have >5 day gap between last estimated fill dates? Yes  Star Rating Drugs: Losartan/HCTZ 100-25 mg- Last filled 07-14-2022 90 DS. Previous 04-14-2022 90 DS   Chart Updates: Recent office visits:  08-16-2022 Jacklynn Bue, LPN (PCP). Annual wellness visit.  07-19-2022 Marianne Sofia, Cordelia Poche (PCP). Follow up visit for hypertension. Stop taking decadron, claritin, avelox, zofran, potassium chloride, prednisone, lyrica, compazine and ambien. No other changes follow up in 3 months for chronic fasting follow up - 3 weeks MCR well with  Selena Batten and bp check.   Recent consult visits:  None  Hospital visits:  None in previous 6 months  Medications: Outpatient Encounter Medications as of 09/10/2022  Medication Sig   acetaminophen (TYLENOL) 500 MG tablet Take 500 mg by mouth every 6 (six) hours as needed (pain.).   albuterol (PROVENTIL) (2.5 MG/3ML) 0.083% nebulizer solution INHALE 1 VIAL VIA NEBULIZER 3 TO 4 TIMES A DAY AS DIRECTED   albuterol (VENTOLIN HFA) 108 (90 Base) MCG/ACT inhaler INHALE 1-2 PUFFS BY MOUTH into THE lungs every 4-6 hours AS NEEDED   amLODipine (NORVASC) 5 MG tablet Take 1 tablet (5 mg total) by mouth daily.   apixaban (ELIQUIS) 5 MG TABS tablet Take 1 tablet by mouth twice daily   betamethasone dipropionate (DIPROLENE) 0.05 % ointment Apply 1 application. topically daily as needed (skin irritation.).   folic acid (FOLVITE) 1 MG tablet TAKE ONE TABLET BY MOUTH ONCE DAILY   levothyroxine (SYNTHROID) 100 MCG tablet TAKE ONE TABLET BY MOUTH EVERY MORNING   LORazepam (ATIVAN) 1 MG tablet TAKE ONE TABLET BY MOUTH THREE TIMES DAILY AS NEEDED FOR ANXIETY   losartan-hydrochlorothiazide (HYZAAR) 100-25 MG tablet Take 1 tablet by mouth daily.   oxyCODONE (OXY IR/ROXICODONE) 5 MG immediate release tablet Take 1 tablet (5 mg total) by mouth 3 (three) times daily as needed.   Polyethyl Glycol-Propyl Glycol (LUBRICANT EYE DROPS) 0.4-0.3 % SOLN Place 1-2 drops into both eyes 3 (three) times daily as needed (dry/irritated eyes.).   potassium chloride SA (KLOR-CON M) 20 MEQ tablet Take 2 tablets (40 mEq total) by mouth 2 (two) times daily.   promethazine (PHENERGAN) 25  MG tablet Take 1 tablet (25 mg total) by mouth every 8 (eight) hours as needed for nausea or vomiting.   SYMBICORT 160-4.5 MCG/ACT inhaler Inhale 2 puffs into the lungs 2 (two) times daily. (Patient taking differently: Inhale 1 puff into the lungs 2 (two) times daily.)   Vitamin D, Ergocalciferol, (DRISDOL) 1.25 MG (50000 UNIT) CAPS capsule Take 1 capsule  (50,000 Units total) by mouth every 7 (seven) days.   No facility-administered encounter medications on file as of 09/10/2022.    Recent Office Vitals: BP Readings from Last 3 Encounters:  08/16/22 118/74  07/19/22 110/60  07/13/22 120/82   Pulse Readings from Last 3 Encounters:  08/16/22 (!) 108  07/19/22 100  07/13/22 (!) 130    Wt Readings from Last 3 Encounters:  08/16/22 163 lb (73.9 kg)  07/19/22 163 lb 6.4 oz (74.1 kg)  07/13/22 165 lb 14.4 oz (75.3 kg)     Kidney Function Lab Results  Component Value Date/Time   CREATININE 1.05 (H) 07/19/2022 10:30 AM   CREATININE 0.9 07/12/2022 12:00 AM   CREATININE 1.28 (H) 04/06/2022 10:44 AM   CREATININE 0.65 12/13/2016 01:06 PM   CREATININE 0.72 07/05/2016 12:42 PM   GFRNONAA >60 09/04/2021 07:43 AM   GFRAA 105 04/23/2020 10:30 AM       Latest Ref Rng & Units 07/19/2022   10:30 AM 07/12/2022   12:00 AM 04/06/2022   10:44 AM  BMP  Glucose 70 - 99 mg/dL 981   191   BUN 8 - 27 mg/dL 17  23     27    Creatinine 0.57 - 1.00 mg/dL 4.78  0.9     2.95   BUN/Creat Ratio 12 - 28 16   21    Sodium 134 - 144 mmol/L 136  134     138   Potassium 3.5 - 5.2 mmol/L 3.7  3.7     4.2   Chloride 96 - 106 mmol/L 98  102     102   CO2 20 - 29 mmol/L 20  24     21    Calcium 8.7 - 10.3 mg/dL 9.4  8.8     9.2      This result is from an external source.    Huey Romans [facility] Clinical Pharmacist Assistant [phone]

## 2022-09-17 ENCOUNTER — Telehealth: Payer: Self-pay

## 2022-09-17 NOTE — Telephone Encounter (Signed)
Transition Care Management Unsuccessful Follow-up Telephone Call  Date of discharge and from where:  [facility] 4/30  Attempts:  1st Attempt  Reason for unsuccessful TCM follow-up call:  Left voice message   Lenard Forth [facility] Guide, [facility] [phone] [address] [place], [place], [place] [address] Phone: [phone] Email: [email or web address]

## 2022-09-17 NOTE — Telephone Encounter (Signed)
Transition Care Management Unsuccessful Follow-up Telephone Call  Date of discharge and from where:  [facility] 4/30  Attempts:  2nd Attempt  Reason for unsuccessful TCM follow-up call:  Left voice message   Lenard Forth [facility] Guide, [facility] [phone] [address] [place], [place], [place] [address] Phone: [phone] Email: [email or web address]

## 2022-09-20 ENCOUNTER — Telehealth: Payer: Self-pay

## 2022-09-20 NOTE — Telephone Encounter (Signed)
Pt called today to request a same day appointment for the following symptoms: URI/sinus sxs. Unfortunately, our schedule is full and we have no openings between today or tomorrow. Pt was notified to go to Urgent Care or if they have MyChart they are able to login to do a e-visit with a [facility] Provider from home.

## 2022-09-26 DIAGNOSIS — J984 Other disorders of lung: Secondary | ICD-10-CM | POA: Diagnosis not present

## 2022-09-26 DIAGNOSIS — Z880 Allergy status to penicillin: Secondary | ICD-10-CM | POA: Diagnosis not present

## 2022-09-26 DIAGNOSIS — J449 Chronic obstructive pulmonary disease, unspecified: Secondary | ICD-10-CM | POA: Diagnosis not present

## 2022-09-26 DIAGNOSIS — E669 Obesity, unspecified: Secondary | ICD-10-CM | POA: Diagnosis not present

## 2022-09-26 DIAGNOSIS — E871 Hypo-osmolality and hyponatremia: Secondary | ICD-10-CM | POA: Diagnosis not present

## 2022-09-26 DIAGNOSIS — I10 Essential (primary) hypertension: Secondary | ICD-10-CM | POA: Diagnosis not present

## 2022-09-26 DIAGNOSIS — Z86711 Personal history of pulmonary embolism: Secondary | ICD-10-CM | POA: Diagnosis not present

## 2022-09-26 DIAGNOSIS — B1920 Unspecified viral hepatitis C without hepatic coma: Secondary | ICD-10-CM | POA: Diagnosis not present

## 2022-09-26 DIAGNOSIS — R161 Splenomegaly, not elsewhere classified: Secondary | ICD-10-CM | POA: Diagnosis not present

## 2022-09-26 DIAGNOSIS — E860 Dehydration: Secondary | ICD-10-CM | POA: Diagnosis not present

## 2022-09-26 DIAGNOSIS — Z79899 Other long term (current) drug therapy: Secondary | ICD-10-CM | POA: Diagnosis not present

## 2022-09-26 DIAGNOSIS — E039 Hypothyroidism, unspecified: Secondary | ICD-10-CM | POA: Diagnosis not present

## 2022-09-26 DIAGNOSIS — C3492 Malignant neoplasm of unspecified part of left bronchus or lung: Secondary | ICD-10-CM | POA: Diagnosis not present

## 2022-09-26 DIAGNOSIS — F319 Bipolar disorder, unspecified: Secondary | ICD-10-CM | POA: Diagnosis not present

## 2022-09-26 DIAGNOSIS — Z7901 Long term (current) use of anticoagulants: Secondary | ICD-10-CM | POA: Diagnosis not present

## 2022-09-26 DIAGNOSIS — F419 Anxiety disorder, unspecified: Secondary | ICD-10-CM | POA: Diagnosis not present

## 2022-09-26 DIAGNOSIS — C799 Secondary malignant neoplasm of unspecified site: Secondary | ICD-10-CM | POA: Diagnosis not present

## 2022-09-26 DIAGNOSIS — R5383 Other fatigue: Secondary | ICD-10-CM | POA: Diagnosis not present

## 2022-09-26 DIAGNOSIS — Z6830 Body mass index (BMI) 30.0-30.9, adult: Secondary | ICD-10-CM | POA: Diagnosis not present

## 2022-09-26 DIAGNOSIS — M1990 Unspecified osteoarthritis, unspecified site: Secondary | ICD-10-CM | POA: Diagnosis not present

## 2022-09-26 DIAGNOSIS — R000 Tachycardia, unspecified: Secondary | ICD-10-CM | POA: Diagnosis not present

## 2022-09-26 DIAGNOSIS — J9601 Acute respiratory failure with hypoxia: Secondary | ICD-10-CM | POA: Diagnosis not present

## 2022-09-26 DIAGNOSIS — K766 Portal hypertension: Secondary | ICD-10-CM | POA: Diagnosis not present

## 2022-09-26 DIAGNOSIS — D649 Anemia, unspecified: Secondary | ICD-10-CM | POA: Diagnosis not present

## 2022-09-26 DIAGNOSIS — R531 Weakness: Secondary | ICD-10-CM | POA: Diagnosis not present

## 2022-09-26 DIAGNOSIS — K7460 Unspecified cirrhosis of liver: Secondary | ICD-10-CM | POA: Diagnosis not present

## 2022-09-26 DIAGNOSIS — J90 Pleural effusion, not elsewhere classified: Secondary | ICD-10-CM | POA: Diagnosis not present

## 2022-09-26 DIAGNOSIS — E876 Hypokalemia: Secondary | ICD-10-CM | POA: Diagnosis not present

## 2022-09-27 DIAGNOSIS — J9601 Acute respiratory failure with hypoxia: Secondary | ICD-10-CM | POA: Diagnosis not present

## 2022-09-27 DIAGNOSIS — C3492 Malignant neoplasm of unspecified part of left bronchus or lung: Secondary | ICD-10-CM | POA: Diagnosis not present

## 2022-09-27 DIAGNOSIS — E871 Hypo-osmolality and hyponatremia: Secondary | ICD-10-CM | POA: Diagnosis not present

## 2022-09-28 ENCOUNTER — Telehealth: Payer: Self-pay

## 2022-09-28 NOTE — Telephone Encounter (Signed)
When and where can we schedule this patient?  Patients son was notified that we do not have an opening within the timeframe that she needs to be seen and will follow-up with the provider for further advisement.  Hospital: [facility] Admitted: 09/26/2022 Discharged: 09/27/2022 Follow-up within: one week. Diagnosed: Stage 4 cancer-lung and liver The patient has an appointment with Oncology on May 29.  If possible, the son is requesting that her appointment be scheduled on a Monday or a Tuesday. He will have to bring her to the appointment.  The son stated that it is ok to leave a detailed message with the appointment information.

## 2022-09-29 DIAGNOSIS — C3490 Malignant neoplasm of unspecified part of unspecified bronchus or lung: Secondary | ICD-10-CM | POA: Diagnosis not present

## 2022-09-30 NOTE — Telephone Encounter (Signed)
Misty has left a message for the patient and has her scheduled for Tuesday 5/28 at 4:20 for 40 min.

## 2022-10-01 ENCOUNTER — Telehealth: Payer: Self-pay

## 2022-10-01 NOTE — Progress Notes (Signed)
Care Management & Coordination Services Pharmacy Team  Reason for Encounter: Medication coordination and delivery  Contacted patient to discuss medications and coordinate delivery from [facility].  Unsuccessful outreach. Left voicemail for patient to return call. Pt is currently admitted in hospital so we are holding form for now until she gets out  Cycle dispensing form sent to [facility] for review.   Last adherence delivery date:07/16/22      Patient is due for next adherence delivery on: 10/13/22 (On Hold)  This delivery to include: Vials  90 Days  Albuterol Inhaler 64mcg-1-2 puffs every 4-6 hours as needed  Eliquis 5mg  -1 tab twice daily (30DS)  Potassium Chloride 2 tabs twice daily Levothyroxine 1 tab every morning Losartan-HCTZ 100-25mg  1 tab once daily Budesonide-Formot Inhaler 160-4.5 2 puffs twice daily Folic Acid 1mg -1 tab once daily     Lorazepam 1mg -1 tab three times daily as needed Amlodipine 5mg -1 tab once daily      Vitamin D2 1.25mg -1 capsule once a week    Oxycodone 5mg -Take 1 tablet (5 mg total) by mouth 3 (three) times daily as needed.   Patient declined the following medications this month: Promethazine 25mg -Pt does not need currently   Refills requested from providers include: Vitamin D2 1.25mg  Lorazepam 1mg  Levothyroxine  Potassium Chloride  Losartan-HCTZ 100-25mg   Chart review: Recent office visits:  None  Recent consult visits:  None  Hospital visits:  Admitted on 10/04/22-Present at [facility] for SOB and Leg Swelling. Disch  Medications: Outpatient Encounter Medications as of 10/01/2022  Medication Sig   acetaminophen (TYLENOL) 500 MG tablet Take 500 mg by mouth every 6 (six) hours as needed (pain.).   albuterol (PROVENTIL) (2.5 MG/3ML) 0.083% nebulizer solution INHALE 1 VIAL VIA NEBULIZER 3 TO 4 TIMES A DAY AS DIRECTED   albuterol (VENTOLIN HFA) 108 (90 Base) MCG/ACT inhaler INHALE 1-2 PUFFS BY MOUTH  into THE lungs every 4-6 hours AS NEEDED   amLODipine (NORVASC) 5 MG tablet Take 1 tablet (5 mg total) by mouth daily.   apixaban (ELIQUIS) 5 MG TABS tablet Take 1 tablet by mouth twice daily   betamethasone dipropionate (DIPROLENE) 0.05 % ointment Apply 1 application. topically daily as needed (skin irritation.).   folic acid (FOLVITE) 1 MG tablet TAKE ONE TABLET BY MOUTH ONCE DAILY   levothyroxine (SYNTHROID) 100 MCG tablet TAKE ONE TABLET BY MOUTH EVERY MORNING   LORazepam (ATIVAN) 1 MG tablet TAKE ONE TABLET BY MOUTH THREE TIMES DAILY AS NEEDED FOR ANXIETY   losartan-hydrochlorothiazide (HYZAAR) 100-25 MG tablet Take 1 tablet by mouth daily.   oxyCODONE (OXY IR/ROXICODONE) 5 MG immediate release tablet Take 1 tablet (5 mg total) by mouth 3 (three) times daily as needed.   Polyethyl Glycol-Propyl Glycol (LUBRICANT EYE DROPS) 0.4-0.3 % SOLN Place 1-2 drops into both eyes 3 (three) times daily as needed (dry/irritated eyes.).   potassium chloride SA (KLOR-CON M) 20 MEQ tablet Take 2 tablets (40 mEq total) by mouth 2 (two) times daily.   promethazine (PHENERGAN) 25 MG tablet Take 1 tablet (25 mg total) by mouth every 8 (eight) hours as needed for nausea or vomiting.   SYMBICORT 160-4.5 MCG/ACT inhaler Inhale 2 puffs into the lungs 2 (two) times daily. (Patient taking differently: Inhale 1 puff into the lungs 2 (two) times daily.)   Vitamin D, Ergocalciferol, (DRISDOL) 1.25 MG (50000 UNIT) CAPS capsule Take 1 capsule (50,000 Units total) by mouth every 7 (seven) days.   No facility-administered encounter medications on  file as of 10/01/2022.   BP Readings from Last 3 Encounters:  08/16/22 118/74  07/19/22 110/60  07/13/22 120/82    Pulse Readings from Last 3 Encounters:  08/16/22 (!) 108  07/19/22 100  07/13/22 (!) 130    Lab Results  Component Value Date/Time   HGBA1C 4.7 (L) 07/19/2022 10:30 AM   HGBA1C 4.3 (L) 04/06/2022 10:44 AM   Lab Results  Component Value Date   CREATININE  1.05 (H) 07/19/2022   BUN 17 07/19/2022   GFRNONAA >60 09/04/2021   GFRAA 105 04/23/2020   NA 136 07/19/2022   K 3.7 07/19/2022   CALCIUM 9.4 07/19/2022   CO2 20 07/19/2022     Roxana Hires, CMA Clinical Pharmacist Assistant  [phone]

## 2022-10-02 ENCOUNTER — Other Ambulatory Visit: Payer: Self-pay | Admitting: Physician Assistant

## 2022-10-02 ENCOUNTER — Other Ambulatory Visit: Payer: Self-pay | Admitting: Hematology and Oncology

## 2022-10-02 DIAGNOSIS — E538 Deficiency of other specified B group vitamins: Secondary | ICD-10-CM

## 2022-10-04 DIAGNOSIS — E039 Hypothyroidism, unspecified: Secondary | ICD-10-CM | POA: Diagnosis not present

## 2022-10-04 DIAGNOSIS — C3492 Malignant neoplasm of unspecified part of left bronchus or lung: Secondary | ICD-10-CM | POA: Diagnosis not present

## 2022-10-04 DIAGNOSIS — I509 Heart failure, unspecified: Secondary | ICD-10-CM | POA: Diagnosis not present

## 2022-10-04 DIAGNOSIS — J90 Pleural effusion, not elsewhere classified: Secondary | ICD-10-CM | POA: Diagnosis not present

## 2022-10-04 DIAGNOSIS — I4891 Unspecified atrial fibrillation: Secondary | ICD-10-CM | POA: Diagnosis not present

## 2022-10-04 DIAGNOSIS — M7989 Other specified soft tissue disorders: Secondary | ICD-10-CM | POA: Diagnosis not present

## 2022-10-04 DIAGNOSIS — I110 Hypertensive heart disease with heart failure: Secondary | ICD-10-CM | POA: Diagnosis not present

## 2022-10-04 DIAGNOSIS — R0602 Shortness of breath: Secondary | ICD-10-CM | POA: Diagnosis not present

## 2022-10-05 ENCOUNTER — Inpatient Hospital Stay: Payer: PPO | Admitting: Physician Assistant

## 2022-10-05 ENCOUNTER — Telehealth: Payer: Self-pay

## 2022-10-05 DIAGNOSIS — I272 Pulmonary hypertension, unspecified: Secondary | ICD-10-CM | POA: Diagnosis not present

## 2022-10-05 DIAGNOSIS — I509 Heart failure, unspecified: Secondary | ICD-10-CM | POA: Diagnosis not present

## 2022-10-05 DIAGNOSIS — K7460 Unspecified cirrhosis of liver: Secondary | ICD-10-CM | POA: Diagnosis not present

## 2022-10-05 DIAGNOSIS — C799 Secondary malignant neoplasm of unspecified site: Secondary | ICD-10-CM | POA: Diagnosis not present

## 2022-10-05 DIAGNOSIS — Z85118 Personal history of other malignant neoplasm of bronchus and lung: Secondary | ICD-10-CM | POA: Diagnosis not present

## 2022-10-05 DIAGNOSIS — C3490 Malignant neoplasm of unspecified part of unspecified bronchus or lung: Secondary | ICD-10-CM | POA: Diagnosis not present

## 2022-10-05 DIAGNOSIS — R0600 Dyspnea, unspecified: Secondary | ICD-10-CM | POA: Diagnosis not present

## 2022-10-05 DIAGNOSIS — B1920 Unspecified viral hepatitis C without hepatic coma: Secondary | ICD-10-CM | POA: Diagnosis not present

## 2022-10-05 DIAGNOSIS — R000 Tachycardia, unspecified: Secondary | ICD-10-CM | POA: Diagnosis not present

## 2022-10-05 DIAGNOSIS — I2510 Atherosclerotic heart disease of native coronary artery without angina pectoris: Secondary | ICD-10-CM | POA: Diagnosis not present

## 2022-10-05 DIAGNOSIS — R059 Cough, unspecified: Secondary | ICD-10-CM | POA: Diagnosis not present

## 2022-10-05 DIAGNOSIS — C7989 Secondary malignant neoplasm of other specified sites: Secondary | ICD-10-CM | POA: Diagnosis not present

## 2022-10-05 DIAGNOSIS — I480 Paroxysmal atrial fibrillation: Secondary | ICD-10-CM | POA: Diagnosis not present

## 2022-10-05 DIAGNOSIS — I3139 Other pericardial effusion (noninflammatory): Secondary | ICD-10-CM | POA: Diagnosis not present

## 2022-10-05 DIAGNOSIS — J90 Pleural effusion, not elsewhere classified: Secondary | ICD-10-CM | POA: Diagnosis not present

## 2022-10-05 DIAGNOSIS — D72829 Elevated white blood cell count, unspecified: Secondary | ICD-10-CM | POA: Diagnosis not present

## 2022-10-05 DIAGNOSIS — I4891 Unspecified atrial fibrillation: Secondary | ICD-10-CM | POA: Diagnosis not present

## 2022-10-05 DIAGNOSIS — Z66 Do not resuscitate: Secondary | ICD-10-CM | POA: Diagnosis not present

## 2022-10-05 DIAGNOSIS — F419 Anxiety disorder, unspecified: Secondary | ICD-10-CM | POA: Diagnosis not present

## 2022-10-05 DIAGNOSIS — Z7951 Long term (current) use of inhaled steroids: Secondary | ICD-10-CM | POA: Diagnosis not present

## 2022-10-05 DIAGNOSIS — Z79890 Hormone replacement therapy (postmenopausal): Secondary | ICD-10-CM | POA: Diagnosis not present

## 2022-10-05 DIAGNOSIS — I5033 Acute on chronic diastolic (congestive) heart failure: Secondary | ICD-10-CM | POA: Diagnosis not present

## 2022-10-05 DIAGNOSIS — Z9221 Personal history of antineoplastic chemotherapy: Secondary | ICD-10-CM | POA: Diagnosis not present

## 2022-10-05 DIAGNOSIS — R32 Unspecified urinary incontinence: Secondary | ICD-10-CM | POA: Diagnosis not present

## 2022-10-05 DIAGNOSIS — Z7901 Long term (current) use of anticoagulants: Secondary | ICD-10-CM | POA: Diagnosis not present

## 2022-10-05 DIAGNOSIS — E039 Hypothyroidism, unspecified: Secondary | ICD-10-CM | POA: Diagnosis not present

## 2022-10-05 DIAGNOSIS — Z86711 Personal history of pulmonary embolism: Secondary | ICD-10-CM | POA: Diagnosis not present

## 2022-10-05 DIAGNOSIS — I110 Hypertensive heart disease with heart failure: Secondary | ICD-10-CM | POA: Diagnosis not present

## 2022-10-05 DIAGNOSIS — M7989 Other specified soft tissue disorders: Secondary | ICD-10-CM | POA: Diagnosis not present

## 2022-10-05 DIAGNOSIS — E8809 Other disorders of plasma-protein metabolism, not elsewhere classified: Secondary | ICD-10-CM | POA: Diagnosis not present

## 2022-10-05 DIAGNOSIS — I493 Ventricular premature depolarization: Secondary | ICD-10-CM | POA: Diagnosis not present

## 2022-10-05 DIAGNOSIS — I361 Nonrheumatic tricuspid (valve) insufficiency: Secondary | ICD-10-CM | POA: Diagnosis not present

## 2022-10-05 DIAGNOSIS — R0602 Shortness of breath: Secondary | ICD-10-CM | POA: Diagnosis not present

## 2022-10-05 NOTE — Telephone Encounter (Signed)
cancel

## 2022-10-05 NOTE — Telephone Encounter (Signed)
Patient's son called stating that the patient is still in the hospital and wont be able to come in because they re-admitted her [facility]. I did not remove off of schedule because he just called at 2:45 pm to tell us, and I did not know how you wanted the front staff to mark this. Please advise.

## 2022-10-06 ENCOUNTER — Telehealth: Payer: Self-pay

## 2022-10-06 ENCOUNTER — Inpatient Hospital Stay: Payer: PPO | Admitting: Oncology

## 2022-10-06 NOTE — Progress Notes (Deleted)
[facility]  [address] [place],  [place]  [address] [phone]  Clinic Day:  07/13/2022  Referring physician: Marianne Sofia, PA-C  HISTORY OF PRESENT ILLNESS:  The patient is a 62 y.o. female will stage IIB (T1N1 M0) lung adenocarcinoma, status post a left lower lobe lobectomy in April 2023.  With respect to her left lower lobectomy in April 2023, her mass measured 2.1 cm, with 1/17 lymph nodes being positive for disease.  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  She finished 4 cycles of adjuvant cisplatin/pemetrexed chemotherapy in September 2023.  CT scans done afterwards showed her to be disease free.  She comes in today to review her most recent chest CT.  Since her last visit, the patient has been doing much better.  She claims her energy has significantly improved the more time she has gotten from all of her adjuvant chemotherapy.  She denies having any new respiratory symptoms which concern her for early disease recurrence.     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  Labs dating back to 2011 have shown her platelet count consistently around 100K.  As she has not been severely thrombocytopenic, her platelets have been followed conservatively.    PHYSICAL EXAM:  There were no vitals taken for this visit. Wt Readings from Last 3 Encounters:  08/16/22 163 lb (73.9 kg)  07/19/22 163 lb 6.4 oz (74.1 kg)  07/13/22 165 lb 14.4 oz (75.3 kg)   There is no height or weight on file to calculate BMI. Performance status (ECOG): 1 - Symptomatic but completely ambulatory Physical Exam Constitutional:      Appearance: Normal appearance. She is not ill-appearing.  HENT:     Mouth/Throat:     Mouth: Mucous membranes are moist.     Pharynx: Oropharynx is clear. No oropharyngeal exudate or posterior oropharyngeal erythema.   Cardiovascular:     Rate and Rhythm: Normal rate and regular rhythm.     Heart sounds: No murmur heard.    No friction rub. No gallop.  Pulmonary:     Effort: Pulmonary effort is normal. No respiratory distress.     Breath sounds: Normal breath sounds. No wheezing, rhonchi or rales.  Abdominal:     General: Bowel sounds are normal. There is no distension.     Palpations: Abdomen is soft. There is no mass.     Tenderness: There is no abdominal tenderness.  Musculoskeletal:        General: No swelling.     Right lower leg: No edema.     Left lower leg: No edema.  Lymphadenopathy:     Cervical: No cervical adenopathy.     Upper Body:     Right upper body: No supraclavicular or axillary adenopathy.     Left upper body: No supraclavicular or axillary adenopathy.     Lower Body: No right inguinal adenopathy. No left inguinal adenopathy.  Skin:    General: Skin is warm.     Coloration: Skin is not jaundiced.     Findings: No erythema, lesion or rash.     Comments:    Neurological:     General: No focal deficit present.     Mental Status: She is alert and oriented to person, place, and time. Mental status is at baseline.  Psychiatric:  Mood and Affect: Mood normal.        Behavior: Behavior normal.        Thought Content: Thought content normal.   SCANS:  Her chest CT done on 09-26-22 during her recent hospitalization revealed the following: FINDINGS: Cardiovascular: Unenhanced imaging of the heart demonstrates trace pericardial effusion. Normal caliber of the thoracic aorta. Atherosclerosis of the aorta and coronary vasculature. Right chest wall port via internal jugular approach, tip within the right atrium. Evaluation of the vascular lumen is limited without IV contrast.  Mediastinum/Nodes: The small mediastinal lymph nodes seen on previous exam are less well visualized today due to the lack of IV contrast. Index lymph node in the anterior mediastinum reference image  34/2 measures 15 x 9 mm, previously 13 x 8 mm. Thyroid gland, trachea, and esophagus demonstrate no significant findings.  Lungs/Pleura: Postsurgical changes from left lower lobectomy. Since the prior exam, there is increased pleural thickening and nodularity throughout the left hemithorax. Index soft tissue nodularity anteriorly measures up to 1.7 x 1.5 cm reference image 41/2, concerning for metastatic disease. There is crescentic soft tissue density that has developed extending along the anterior mediastinal margin within the anterior left hemithorax, measuring 4.3 x 3.0 cm in size reference image 41/2, consistent with pleural metastasis.  Additionally, multiple bilateral pulmonary nodules have developed consistent with metastatic disease. Index nodules are as follows:  Right upper lobe, image 15/4, 7 x 7 mm.  Right lower lobe, image 50/4, 10 x 8 mm.  Left upper lobe, image 48/4, 4 x 5 mm.  Background emphysema. No pneumothorax. Central airways are patent.  Upper Abdomen: Stable cirrhosis. Portal venous hypertension manifested by splenomegaly, upper abdominal varices, and recanalization of the umbilical vein and abdominal wall venous collaterals. There is a new 1.7 cm hypodensity within the dome of the liver reference image 81/2, consistent with hepatic metastasis.  Musculoskeletal: There are no acute displaced fractures. No destructive bony lesions. Stable sclerotic focus anterior aspect T8 vertebral body indeterminate. Reconstructed images demonstrate no additional findings.  IMPRESSION: 1. New left-sided pleural thickening and soft tissue nodularity, consistent with pleural metastatic disease. 2. Innumerable bilateral pulmonary nodules consistent with pulmonary metastatic disease, new since prior study. 3. New 1.7 cm hypodense lesion dome of the right lobe liver, consistent with hepatic metastasis. 4. Cirrhosis, with portal venous hypertension manifested  by splenomegaly, upper abdominal varices, and recanalization of the umbilical vein. 5. Trace pericardial effusion. 6. Aortic Atherosclerosis (ICD10-I70.0).   LABS:       ASSESSMENT & PLAN:  Assessment/Plan:  A 62 y.o. female with stage IIB (T1 N1 M0) lung adenocarcinoma, status post a left lower lobe lobectomy in April 2023.  She completed her 4 cycles of adjuvant cisplatin/pemetrexed in September 2023.  In clinic today, I went over all of her CT scan images with her, for which she could see that she remains disease-free.  Understandably, she was pleased with her scans.  Clinically, she is doing well.  Chest CTs will continue to be done every 4 months for this first year after completion of her definitive therapy.  With respect to her cytopenias, the patient does have known splenomegaly from hepatitis C cirrhosis which is likely contributing to her low counts.  However, there have been no significant changes in her peripheral counts.  I will see her back in July 2024 for repeat clinical assessment, with a chest CT being done a day before her next visit for her continued radiographic lung cancer surveillance.  The patient  understands all the plans discussed today and is in agreement with them.  Cynthie Garmon Kirby Funk, MD

## 2022-10-06 NOTE — Telephone Encounter (Signed)
Patients son called with concerns patient in [facility] hospital, with CHF and A fib possibly just had chest tube placed per sons understanding. Scans recently done at [facility] showed cancer has changed from a stage 2 to a stage 4 per son. They have rescheduled todays appt. Dr Melvyn Neth  made aware reviewed [place] scans today and will reach out to patients son

## 2022-10-12 ENCOUNTER — Telehealth: Payer: Self-pay | Admitting: *Deleted

## 2022-10-12 ENCOUNTER — Other Ambulatory Visit: Payer: Self-pay | Admitting: Physician Assistant

## 2022-10-12 ENCOUNTER — Encounter: Payer: Self-pay | Admitting: *Deleted

## 2022-10-12 NOTE — Transitions of Care (Post Inpatient/ED Visit) (Signed)
   10/12/2022  Name: Natalie Perry MRN: [id number] DOB: 1961-04-07  Today's TOC FU Call Status: Today's TOC FU Call Status:: Unsuccessul Call (1st Attempt) Unsuccessful Call (1st Attempt) Date: 10/12/22  Attempted to reach the patient regarding the most recent Inpatient visit; left HIPAA compliant voice message requesting call back  Follow Up Plan: Additional outreach attempts will be made to reach the patient to complete the Transitions of Care (Post Inpatient visit) call.   Caryl Pina, RN, BSN, CCRN Alumnus RN CM Care Coordination/ Transition of Care- [facility] Care Management [phone]: direct office

## 2022-10-13 ENCOUNTER — Telehealth: Payer: Self-pay

## 2022-10-13 ENCOUNTER — Encounter: Payer: Self-pay | Admitting: *Deleted

## 2022-10-13 ENCOUNTER — Telehealth: Payer: Self-pay | Admitting: *Deleted

## 2022-10-13 NOTE — Transitions of Care (Post Inpatient/ED Visit) (Signed)
   10/13/2022  Name: Natalie Perry MRN: [id number] DOB: 28-Sep-1960  Today's TOC FU Call Status: Today's TOC FU Call Status:: Unsuccessful Call (2nd Attempt) Unsuccessful Call (2nd Attempt) Date: 10/13/22  Attempted to reach the patient regarding the most recent Inpatient visit; left HIPAA compliant voice message requesting call back  Follow Up Plan: Additional outreach attempts will be made to reach the patient to complete the Transitions of Care (Post Inpatient visit) call.   Caryl Pina, RN, BSN, CCRN Alumnus RN CM Care Coordination/ Transition of Care- [facility] Care Management [phone]: direct office

## 2022-10-13 NOTE — Telephone Encounter (Cosign Needed)
Done!  Thank you  Danielle Gerringer, CMA Clinical Pharmacist Assistant  [phone]  

## 2022-10-13 NOTE — Telephone Encounter (Signed)
I spoke with Natalie Perry's son, [name]. Natalie Perry requesting to cancel tomorrow's appt. Natalie Perry has been in hospital 3 times in last 4 weeks. She was just discharged from [facility] yesterday. They diagnosed her with CHF and took 1 liter of fluid off surrounding the heart. She also had an irregular heartbeat. She is pretty weak right now. She can't walk. She has to use a wheelchair. Physical therapy is supposed to be coming in tomorrow to see her. Natalie Perry lives with boyfriend and son, [name]. I sent above message to Dr Melvyn Neth. Appt R/S for 10/25/2022.

## 2022-10-14 ENCOUNTER — Encounter: Payer: Self-pay | Admitting: *Deleted

## 2022-10-14 ENCOUNTER — Telehealth: Payer: Self-pay | Admitting: *Deleted

## 2022-10-14 ENCOUNTER — Ambulatory Visit: Payer: PPO | Admitting: Oncology

## 2022-10-14 DIAGNOSIS — Z7901 Long term (current) use of anticoagulants: Secondary | ICD-10-CM | POA: Diagnosis not present

## 2022-10-14 DIAGNOSIS — J9811 Atelectasis: Secondary | ICD-10-CM | POA: Diagnosis not present

## 2022-10-14 DIAGNOSIS — Z7951 Long term (current) use of inhaled steroids: Secondary | ICD-10-CM | POA: Diagnosis not present

## 2022-10-14 DIAGNOSIS — Z556 Problems related to health literacy: Secondary | ICD-10-CM | POA: Diagnosis not present

## 2022-10-14 DIAGNOSIS — C3492 Malignant neoplasm of unspecified part of left bronchus or lung: Secondary | ICD-10-CM | POA: Diagnosis not present

## 2022-10-14 DIAGNOSIS — E039 Hypothyroidism, unspecified: Secondary | ICD-10-CM | POA: Diagnosis not present

## 2022-10-14 DIAGNOSIS — F419 Anxiety disorder, unspecified: Secondary | ICD-10-CM | POA: Diagnosis not present

## 2022-10-14 DIAGNOSIS — I509 Heart failure, unspecified: Secondary | ICD-10-CM | POA: Diagnosis not present

## 2022-10-14 DIAGNOSIS — I4891 Unspecified atrial fibrillation: Secondary | ICD-10-CM | POA: Diagnosis not present

## 2022-10-14 DIAGNOSIS — Z9181 History of falling: Secondary | ICD-10-CM | POA: Diagnosis not present

## 2022-10-14 DIAGNOSIS — I110 Hypertensive heart disease with heart failure: Secondary | ICD-10-CM | POA: Diagnosis not present

## 2022-10-14 DIAGNOSIS — I272 Pulmonary hypertension, unspecified: Secondary | ICD-10-CM | POA: Diagnosis not present

## 2022-10-14 DIAGNOSIS — Z86711 Personal history of pulmonary embolism: Secondary | ICD-10-CM | POA: Diagnosis not present

## 2022-10-14 NOTE — Transitions of Care (Post Inpatient/ED Visit) (Signed)
   10/14/2022  Name: ASHAKI FEBRES MRN: [id number] DOB: 10-Nov-1960  Today's TOC FU Call Status: Today's TOC FU Call Status:: Unsuccessful Call (3rd Attempt) Unsuccessful Call (3rd Attempt) Date: 10/14/22  Attempted to reach the patient regarding the most recent Inpatient visit; left HIPAA compliant voice message requesting call back  Follow Up Plan: No further outreach attempts will be made at this time. We have been unable to contact the patient.  Caryl Pina, RN, BSN, CCRN Alumnus RN CM Care Coordination/ Transition of Care- [facility] Care Management [phone]: direct office

## 2022-10-20 ENCOUNTER — Telehealth: Payer: Self-pay

## 2022-10-20 DIAGNOSIS — C3490 Malignant neoplasm of unspecified part of unspecified bronchus or lung: Secondary | ICD-10-CM | POA: Diagnosis not present

## 2022-10-20 DIAGNOSIS — R6521 Severe sepsis with septic shock: Secondary | ICD-10-CM | POA: Diagnosis not present

## 2022-10-20 DIAGNOSIS — E872 Acidosis, unspecified: Secondary | ICD-10-CM | POA: Diagnosis not present

## 2022-10-20 DIAGNOSIS — E8809 Other disorders of plasma-protein metabolism, not elsewhere classified: Secondary | ICD-10-CM | POA: Diagnosis not present

## 2022-10-20 DIAGNOSIS — E778 Other disorders of glycoprotein metabolism: Secondary | ICD-10-CM | POA: Diagnosis not present

## 2022-10-20 DIAGNOSIS — I959 Hypotension, unspecified: Secondary | ICD-10-CM | POA: Diagnosis not present

## 2022-10-20 DIAGNOSIS — I361 Nonrheumatic tricuspid (valve) insufficiency: Secondary | ICD-10-CM | POA: Diagnosis not present

## 2022-10-20 DIAGNOSIS — I4891 Unspecified atrial fibrillation: Secondary | ICD-10-CM | POA: Diagnosis not present

## 2022-10-20 DIAGNOSIS — C779 Secondary and unspecified malignant neoplasm of lymph node, unspecified: Secondary | ICD-10-CM | POA: Diagnosis not present

## 2022-10-20 DIAGNOSIS — I110 Hypertensive heart disease with heart failure: Secondary | ICD-10-CM | POA: Diagnosis not present

## 2022-10-20 DIAGNOSIS — C787 Secondary malignant neoplasm of liver and intrahepatic bile duct: Secondary | ICD-10-CM | POA: Diagnosis not present

## 2022-10-20 DIAGNOSIS — I5031 Acute diastolic (congestive) heart failure: Secondary | ICD-10-CM | POA: Diagnosis not present

## 2022-10-20 DIAGNOSIS — R918 Other nonspecific abnormal finding of lung field: Secondary | ICD-10-CM | POA: Diagnosis not present

## 2022-10-20 DIAGNOSIS — R000 Tachycardia, unspecified: Secondary | ICD-10-CM | POA: Diagnosis not present

## 2022-10-20 DIAGNOSIS — R911 Solitary pulmonary nodule: Secondary | ICD-10-CM | POA: Diagnosis not present

## 2022-10-20 DIAGNOSIS — E860 Dehydration: Secondary | ICD-10-CM | POA: Diagnosis not present

## 2022-10-20 DIAGNOSIS — R7989 Other specified abnormal findings of blood chemistry: Secondary | ICD-10-CM | POA: Diagnosis not present

## 2022-10-20 DIAGNOSIS — E871 Hypo-osmolality and hyponatremia: Secondary | ICD-10-CM | POA: Diagnosis not present

## 2022-10-20 DIAGNOSIS — K7460 Unspecified cirrhosis of liver: Secondary | ICD-10-CM | POA: Diagnosis not present

## 2022-10-20 DIAGNOSIS — E440 Moderate protein-calorie malnutrition: Secondary | ICD-10-CM | POA: Diagnosis not present

## 2022-10-20 DIAGNOSIS — D649 Anemia, unspecified: Secondary | ICD-10-CM | POA: Diagnosis not present

## 2022-10-20 DIAGNOSIS — D696 Thrombocytopenia, unspecified: Secondary | ICD-10-CM | POA: Diagnosis not present

## 2022-10-20 DIAGNOSIS — J449 Chronic obstructive pulmonary disease, unspecified: Secondary | ICD-10-CM | POA: Diagnosis not present

## 2022-10-20 DIAGNOSIS — R7401 Elevation of levels of liver transaminase levels: Secondary | ICD-10-CM | POA: Diagnosis not present

## 2022-10-20 DIAGNOSIS — E222 Syndrome of inappropriate secretion of antidiuretic hormone: Secondary | ICD-10-CM | POA: Diagnosis not present

## 2022-10-20 DIAGNOSIS — A419 Sepsis, unspecified organism: Secondary | ICD-10-CM | POA: Diagnosis not present

## 2022-10-20 DIAGNOSIS — C801 Malignant (primary) neoplasm, unspecified: Secondary | ICD-10-CM | POA: Diagnosis not present

## 2022-10-20 DIAGNOSIS — I3139 Other pericardial effusion (noninflammatory): Secondary | ICD-10-CM | POA: Diagnosis not present

## 2022-10-20 DIAGNOSIS — R531 Weakness: Secondary | ICD-10-CM | POA: Diagnosis not present

## 2022-10-20 DIAGNOSIS — K7581 Nonalcoholic steatohepatitis (NASH): Secondary | ICD-10-CM | POA: Diagnosis not present

## 2022-10-20 DIAGNOSIS — R188 Other ascites: Secondary | ICD-10-CM | POA: Diagnosis not present

## 2022-10-20 DIAGNOSIS — I3131 Malignant pericardial effusion in diseases classified elsewhere: Secondary | ICD-10-CM | POA: Diagnosis not present

## 2022-10-20 DIAGNOSIS — C7800 Secondary malignant neoplasm of unspecified lung: Secondary | ICD-10-CM | POA: Diagnosis not present

## 2022-10-20 NOTE — Telephone Encounter (Signed)
Pt hasn't been feeling well. "Her heart rate has been anywhere from 120-170. She normally runs in the 70's. I just wanted to let Dr Melvyn Neth know she was going to the ER in case he would like to see what they are doing"?   I notified Dr Melvyn Neth.

## 2022-10-21 ENCOUNTER — Telehealth: Payer: Self-pay | Admitting: Physician Assistant

## 2022-10-21 DIAGNOSIS — I3139 Other pericardial effusion (noninflammatory): Secondary | ICD-10-CM

## 2022-10-21 DIAGNOSIS — I361 Nonrheumatic tricuspid (valve) insufficiency: Secondary | ICD-10-CM

## 2022-10-21 NOTE — Telephone Encounter (Signed)
[facility] POC 10/14/2022

## 2022-10-25 ENCOUNTER — Inpatient Hospital Stay: Payer: PPO

## 2022-10-25 ENCOUNTER — Ambulatory Visit: Payer: PPO | Admitting: Oncology

## 2022-10-26 ENCOUNTER — Ambulatory Visit: Payer: PPO | Admitting: Physician Assistant

## 2022-10-28 ENCOUNTER — Encounter: Payer: Self-pay | Admitting: Internal Medicine

## 2022-11-01 ENCOUNTER — Other Ambulatory Visit: Payer: Self-pay | Admitting: Physician Assistant

## 2022-11-03 ENCOUNTER — Encounter: Payer: Self-pay | Admitting: Internal Medicine

## 2022-11-08 DEATH — deceased

## 2022-11-16 ENCOUNTER — Ambulatory Visit: Payer: PPO | Admitting: Oncology

## 2023-06-01 ENCOUNTER — Telehealth: Payer: Self-pay | Admitting: Physician Assistant

## 2023-06-01 NOTE — Telephone Encounter (Signed)
Copied from CRM [id number]. Topic: General - Other >> Jun 01, 2023  8:26 AM Phill Myron wrote: Reason for CRM:   Isabelle Course- who is from [facility] home health, will be refaxing some documents of care. She is unaware of patient being deceased. Please advise.

## 2023-06-02 NOTE — Telephone Encounter (Signed)
[facility] CERT/POC ORDER NUMBER: [id number]
